# Patient Record
Sex: Female | Born: 1935 | Race: White | Hispanic: No | Marital: Married | Smoking: Former smoker
Health system: Southern US, Community
[De-identification: ages and names within clinical notes are randomized; demographics above are authoritative.]

## PROBLEM LIST (undated history)

## (undated) DIAGNOSIS — I890 Lymphedema, not elsewhere classified: Secondary | ICD-10-CM

## (undated) DIAGNOSIS — E7800 Pure hypercholesterolemia, unspecified: Secondary | ICD-10-CM

## (undated) DIAGNOSIS — E039 Hypothyroidism, unspecified: Secondary | ICD-10-CM

## (undated) DIAGNOSIS — K649 Unspecified hemorrhoids: Secondary | ICD-10-CM

## (undated) DIAGNOSIS — N390 Urinary tract infection, site not specified: Secondary | ICD-10-CM

## (undated) DIAGNOSIS — N952 Postmenopausal atrophic vaginitis: Secondary | ICD-10-CM

## (undated) DIAGNOSIS — F32A Depression, unspecified: Secondary | ICD-10-CM

## (undated) DIAGNOSIS — I499 Cardiac arrhythmia, unspecified: Secondary | ICD-10-CM

## (undated) DIAGNOSIS — F329 Major depressive disorder, single episode, unspecified: Secondary | ICD-10-CM

## (undated) DIAGNOSIS — F0390 Unspecified dementia without behavioral disturbance: Secondary | ICD-10-CM

## (undated) DIAGNOSIS — N816 Rectocele: Secondary | ICD-10-CM

## (undated) DIAGNOSIS — N815 Vaginal enterocele: Secondary | ICD-10-CM

## (undated) DIAGNOSIS — I10 Essential (primary) hypertension: Secondary | ICD-10-CM

## (undated) DIAGNOSIS — IMO0002 Reserved for concepts with insufficient information to code with codable children: Secondary | ICD-10-CM

## (undated) DIAGNOSIS — R339 Retention of urine, unspecified: Secondary | ICD-10-CM

## (undated) DIAGNOSIS — K5900 Constipation, unspecified: Secondary | ICD-10-CM

## (undated) DIAGNOSIS — F419 Anxiety disorder, unspecified: Secondary | ICD-10-CM

## (undated) DIAGNOSIS — E079 Disorder of thyroid, unspecified: Secondary | ICD-10-CM

## (undated) DIAGNOSIS — I509 Heart failure, unspecified: Secondary | ICD-10-CM

## (undated) HISTORY — DX: Heart failure, unspecified: I50.9

## (undated) HISTORY — DX: Anxiety disorder, unspecified: F41.9

## (undated) HISTORY — DX: Postmenopausal atrophic vaginitis: N95.2

## (undated) HISTORY — DX: Essential (primary) hypertension: I10

## (undated) HISTORY — DX: Retention of urine, unspecified: R33.9

## (undated) HISTORY — DX: Disorder of thyroid, unspecified: E07.9

## (undated) HISTORY — DX: Vaginal enterocele: N81.5

## (undated) HISTORY — PX: APPENDECTOMY: SHX54

## (undated) HISTORY — DX: Lymphedema, not elsewhere classified: I89.0

## (undated) HISTORY — DX: Unspecified hemorrhoids: K64.9

## (undated) HISTORY — DX: Depression, unspecified: F32.A

## (undated) HISTORY — DX: Constipation, unspecified: K59.00

## (undated) HISTORY — DX: Pure hypercholesterolemia, unspecified: E78.00

## (undated) HISTORY — DX: Urinary tract infection, site not specified: N39.0

## (undated) HISTORY — DX: Reserved for concepts with insufficient information to code with codable children: IMO0002

## (undated) HISTORY — DX: Cardiac arrhythmia, unspecified: I49.9

## (undated) HISTORY — DX: Major depressive disorder, single episode, unspecified: F32.9

## (undated) HISTORY — DX: Rectocele: N81.6

---

## 1975-01-14 HISTORY — PX: TOTAL ABDOMINAL HYSTERECTOMY: SHX209

## 2004-03-20 ENCOUNTER — Ambulatory Visit: Payer: Self-pay | Admitting: Internal Medicine

## 2004-06-19 ENCOUNTER — Ambulatory Visit: Payer: Self-pay | Admitting: Unknown Physician Specialty

## 2005-06-03 ENCOUNTER — Ambulatory Visit: Payer: Self-pay | Admitting: Internal Medicine

## 2006-03-03 ENCOUNTER — Other Ambulatory Visit: Payer: Self-pay

## 2006-03-03 ENCOUNTER — Ambulatory Visit: Payer: Self-pay | Admitting: Urology

## 2006-03-17 ENCOUNTER — Ambulatory Visit: Payer: Self-pay | Admitting: Urology

## 2010-01-13 HISTORY — PX: EYE SURGERY: SHX253

## 2010-12-19 ENCOUNTER — Ambulatory Visit: Payer: Self-pay | Admitting: Internal Medicine

## 2014-07-27 ENCOUNTER — Ambulatory Visit (INDEPENDENT_AMBULATORY_CARE_PROVIDER_SITE_OTHER): Payer: Medicare Other | Admitting: Obstetrics and Gynecology

## 2014-07-27 ENCOUNTER — Encounter: Payer: Self-pay | Admitting: Obstetrics and Gynecology

## 2014-07-27 VITALS — BP 121/73 | HR 56 | Ht 65.0 in | Wt 140.0 lb

## 2014-07-27 DIAGNOSIS — N815 Vaginal enterocele: Secondary | ICD-10-CM | POA: Diagnosis not present

## 2014-07-27 DIAGNOSIS — K5900 Constipation, unspecified: Secondary | ICD-10-CM | POA: Diagnosis not present

## 2014-07-27 DIAGNOSIS — IMO0002 Reserved for concepts with insufficient information to code with codable children: Secondary | ICD-10-CM

## 2014-07-27 DIAGNOSIS — E7800 Pure hypercholesterolemia, unspecified: Secondary | ICD-10-CM | POA: Insufficient documentation

## 2014-07-27 DIAGNOSIS — N816 Rectocele: Secondary | ICD-10-CM | POA: Insufficient documentation

## 2014-07-27 DIAGNOSIS — K649 Unspecified hemorrhoids: Secondary | ICD-10-CM | POA: Diagnosis not present

## 2014-07-27 DIAGNOSIS — Z4689 Encounter for fitting and adjustment of other specified devices: Secondary | ICD-10-CM | POA: Diagnosis not present

## 2014-07-27 DIAGNOSIS — N8110 Cystocele, unspecified: Secondary | ICD-10-CM | POA: Diagnosis not present

## 2014-07-27 DIAGNOSIS — Z780 Asymptomatic menopausal state: Secondary | ICD-10-CM | POA: Diagnosis not present

## 2014-07-27 DIAGNOSIS — F418 Other specified anxiety disorders: Secondary | ICD-10-CM

## 2014-07-27 DIAGNOSIS — F419 Anxiety disorder, unspecified: Secondary | ICD-10-CM

## 2014-07-27 DIAGNOSIS — N952 Postmenopausal atrophic vaginitis: Secondary | ICD-10-CM | POA: Diagnosis not present

## 2014-07-27 DIAGNOSIS — F32A Depression, unspecified: Secondary | ICD-10-CM | POA: Insufficient documentation

## 2014-07-27 DIAGNOSIS — R339 Retention of urine, unspecified: Secondary | ICD-10-CM | POA: Diagnosis not present

## 2014-07-27 DIAGNOSIS — F329 Major depressive disorder, single episode, unspecified: Secondary | ICD-10-CM | POA: Insufficient documentation

## 2014-07-27 DIAGNOSIS — I10 Essential (primary) hypertension: Secondary | ICD-10-CM | POA: Diagnosis not present

## 2014-07-27 DIAGNOSIS — N8111 Cystocele, midline: Secondary | ICD-10-CM | POA: Insufficient documentation

## 2014-07-27 NOTE — Progress Notes (Signed)
Patient ID: Deborah Martinez, female   DOB: Dec 14, 1927, 81 y.o.   MRN: [id number] Chief complaint: 1.  Pessary maintenance. 2.  Cystocele.  4 month pessary check Ring with support No-vaginal d/c,odor, or pain Premarin - q 7-10 days  OBJECTIVE: Pleasant, well-appearing female in no acute distress. Abdomen:  soft, nontender. Pelvic:  External genitalia: Normal  BUS: Normal .  Vagina: Healthy mucosa   PROCEDURE: Pessary is removed, cleaned, and replaced.  IMPRESSION: 1.  Pessary maintenance-normal encounter. 2.  Pessary is removed, cleaned and replaced.  PLAN: 1.  Continue with Premarin cream weekly. 2.  Return in 4 months for pessary maintenance

## 2014-10-31 ENCOUNTER — Encounter: Payer: Self-pay | Admitting: Obstetrics and Gynecology

## 2014-10-31 ENCOUNTER — Ambulatory Visit (INDEPENDENT_AMBULATORY_CARE_PROVIDER_SITE_OTHER): Payer: Medicare Other | Admitting: Obstetrics and Gynecology

## 2014-10-31 VITALS — BP 135/85 | HR 58 | Ht 65.0 in | Wt 139.4 lb

## 2014-10-31 DIAGNOSIS — N815 Vaginal enterocele: Secondary | ICD-10-CM

## 2014-10-31 DIAGNOSIS — N816 Rectocele: Secondary | ICD-10-CM

## 2014-10-31 DIAGNOSIS — N8110 Cystocele, unspecified: Secondary | ICD-10-CM | POA: Diagnosis not present

## 2014-10-31 DIAGNOSIS — N952 Postmenopausal atrophic vaginitis: Secondary | ICD-10-CM

## 2014-10-31 DIAGNOSIS — IMO0002 Reserved for concepts with insufficient information to code with codable children: Secondary | ICD-10-CM

## 2014-10-31 NOTE — Progress Notes (Signed)
Patient ID: Deborah Martinez, female   DOB: [birth year], 81 y.o.   MRN: [id number]  Pt presents for 3 month pessary check.   Pessary: ring with support Medication: continues with premarin vaginal cream every 7-10 days.    Expand All Collapse All   Patient ID: Deborah CatalanBetty G Martinez, female DOB: [birth year], 81 y.o. MRN: [id number] Chief complaint: 1. Pessary maintenance. 2. Cystocele.  4 month pessary check Ring with support Last visit 07/27/2014.  No problems with voiding, bowel movements.  No vaginal discharge or odor.  No-vaginal d/c,odor, or pain Premarin - q 7-10 days  OBJECTIVE: Pleasant, well-appearing female in no acute distress. Abdomen: soft, nontender. Pelvic: External genitalia: Normal BUS: Normal. Vagina: Healthy mucosa  PROCEDURE: Pessary is removed, cleaned, and replaced.  IMPRESSION: 1. Pessary maintenance-normal encounter. 2. Pessary is removed, cleaned and replaced.  PLAN: 1. Continue with Premarin cream weekly. 2. Return in 4 months for pessary maintenance  Herold HarmsMartin A Deroy Noah, MD  A total of 15 minutes were spent face-to-face with the patient during this encounter and over half of that time dealt with counseling and coordination of care.

## 2014-10-31 NOTE — Patient Instructions (Signed)
1.  Continue with Premarin cream, intravaginal every 7-10 days. 2.  Return in 4 months for pessary check.  [facility](Mid February).

## 2014-11-28 ENCOUNTER — Ambulatory Visit: Payer: Medicare Other | Admitting: Obstetrics and Gynecology

## 2015-03-06 ENCOUNTER — Ambulatory Visit (INDEPENDENT_AMBULATORY_CARE_PROVIDER_SITE_OTHER): Payer: Medicare Other | Admitting: Obstetrics and Gynecology

## 2015-03-06 ENCOUNTER — Encounter: Payer: Self-pay | Admitting: Obstetrics and Gynecology

## 2015-03-06 VITALS — BP 113/69 | HR 54 | Ht 65.0 in | Wt 137.9 lb

## 2015-03-06 DIAGNOSIS — R339 Retention of urine, unspecified: Secondary | ICD-10-CM | POA: Diagnosis not present

## 2015-03-06 DIAGNOSIS — N8110 Cystocele, unspecified: Secondary | ICD-10-CM | POA: Diagnosis not present

## 2015-03-06 DIAGNOSIS — Z4689 Encounter for fitting and adjustment of other specified devices: Secondary | ICD-10-CM | POA: Diagnosis not present

## 2015-03-06 DIAGNOSIS — IMO0002 Reserved for concepts with insufficient information to code with codable children: Secondary | ICD-10-CM

## 2015-03-06 DIAGNOSIS — N815 Vaginal enterocele: Secondary | ICD-10-CM | POA: Diagnosis not present

## 2015-03-06 MED ORDER — ESTROGENS, CONJUGATED 0.625 MG/GM VA CREA: 0.2500 | TOPICAL_CREAM | VAGINAL | Status: DC

## 2015-03-06 NOTE — Patient Instructions (Signed)
1. Return in 4 months for pessary check 2. Continue with Premarin cream intravaginal once a week

## 2015-03-06 NOTE — Progress Notes (Signed)
Expand All Collapse All   Patient ID: Deborah Martinez, female DOB: [birth year], 82 y.o. MRN: [id number]  Pt presents for 3 month pessary check.  Pessary: ring with support Medication: continues with premarin vaginal cream every 7-10 days.    Expand All Collapse All  Patient ID: Deborah Martinez, female DOB: 1927-06-10, 82 y.o. MRN: [id number] Chief complaint: 1. Pessary maintenance. 2. Cystocele.  4 month pessary check Ring with support Last visit 10/31/2014.  No problems with voiding, bowel movements.  No vaginal discharge or odor.  No-vaginal d/c,odor, or pain Premarin - q 7-10 days  OBJECTIVE: Pleasant, well-appearing female in no acute distress. Abdomen: soft, nontender. Pelvic: External genitalia: Normal BUS: Normal. Vagina: Healthy mucosa; no discharge  PROCEDURE: Pessary is removed, cleaned, and replaced.  IMPRESSION: 1. Pessary maintenance-normal encounter. 2. Pessary is removed, cleaned and replaced.  PLAN: 1. Continue with Premarin cream weekly. 2. Return in 4 months for pessary maintenance  Herold Harms, MD  A total of 15 minutes were spent face-to-face with the patient during this encounter and over half of that time dealt with counseling and coordination of care.

## 2015-07-04 ENCOUNTER — Ambulatory Visit: Payer: Medicare Other | Admitting: Obstetrics and Gynecology

## 2015-07-12 ENCOUNTER — Encounter: Payer: Self-pay | Admitting: Obstetrics and Gynecology

## 2015-07-12 ENCOUNTER — Ambulatory Visit (INDEPENDENT_AMBULATORY_CARE_PROVIDER_SITE_OTHER): Payer: Medicare Other | Admitting: Obstetrics and Gynecology

## 2015-07-12 VITALS — BP 120/79 | HR 92 | Wt 141.6 lb

## 2015-07-12 DIAGNOSIS — IMO0002 Reserved for concepts with insufficient information to code with codable children: Secondary | ICD-10-CM

## 2015-07-12 DIAGNOSIS — N8110 Cystocele, unspecified: Secondary | ICD-10-CM

## 2015-07-12 DIAGNOSIS — Z4689 Encounter for fitting and adjustment of other specified devices: Secondary | ICD-10-CM | POA: Diagnosis not present

## 2015-07-12 DIAGNOSIS — N952 Postmenopausal atrophic vaginitis: Secondary | ICD-10-CM

## 2015-07-12 NOTE — Progress Notes (Signed)
Chief complaint: 1. Pessary maintenance 2. Cystocele  4 month pessary check Ring with support Last visit 03/06/2015  No problems with voiding, bowel movements. No vaginal discharge. Using Premarin cream intravaginal every week  OBJECTIVE: BP 120/79 mmHg  Pulse 92  Wt 141 lb 9 oz (64.212 kg) Pleasant well-appearing white female in no acute distress. Alert and oriented. Abdomen: Soft, nontender Pelvic exam: External genitalia within normal BUS-normal Vagina-mucosa healthy; no significant discharge  PROCEDURE: Pessary is removed, cleaned, and reinserted  ASSESSMENT: 1. Pessary maintenance, routine 2. Pessary is removed, cleaned, and reinserted  PLAN: 1. Continue with Premarin cream weekly intravaginal 2. Return in 4 months for pessary maintenance  A total of 15 minutes were spent face-to-face with the patient during this encounter and over half of that time dealt with counseling and coordination of care.  Herold HarmsMartin A Haillee Johann, MD  Note: This dictation was prepared with Dragon dictation along with smaller phrase technology. Any transcriptional errors that result from this process are unintentional.

## 2015-07-12 NOTE — Patient Instructions (Signed)
1. Return in 4 months for pessary maintenance

## 2015-11-13 ENCOUNTER — Ambulatory Visit (INDEPENDENT_AMBULATORY_CARE_PROVIDER_SITE_OTHER): Payer: Medicare Other | Admitting: Obstetrics and Gynecology

## 2015-11-13 ENCOUNTER — Encounter: Payer: Self-pay | Admitting: Obstetrics and Gynecology

## 2015-11-13 VITALS — BP 121/77 | HR 60 | Ht 65.0 in | Wt 138.1 lb

## 2015-11-13 DIAGNOSIS — R339 Retention of urine, unspecified: Secondary | ICD-10-CM | POA: Diagnosis not present

## 2015-11-13 DIAGNOSIS — N815 Vaginal enterocele: Secondary | ICD-10-CM | POA: Diagnosis not present

## 2015-11-13 DIAGNOSIS — K5900 Constipation, unspecified: Secondary | ICD-10-CM | POA: Diagnosis not present

## 2015-11-13 DIAGNOSIS — N952 Postmenopausal atrophic vaginitis: Secondary | ICD-10-CM | POA: Diagnosis not present

## 2015-11-13 DIAGNOSIS — N816 Rectocele: Secondary | ICD-10-CM | POA: Diagnosis not present

## 2015-11-13 DIAGNOSIS — Z4689 Encounter for fitting and adjustment of other specified devices: Secondary | ICD-10-CM

## 2015-11-13 NOTE — Progress Notes (Signed)
Chief complaint: 1. Pessary maintenance 2. Cystocele  4 month pessary check Ring with support Last visit 07/12/2015  No problems with voiding, bowel movements.mild constipation No vaginal discharge. Using Premarin cream intravaginal every week  OBJECTIVE: BP 121/77   Pulse 60   Ht 5\' 5"  (1.651 m)   Wt 138 lb 1.6 oz (62.6 kg)   BMI 22.98 kg/m  Pleasant well-appearing white female in no acute distress. Alert and oriented. Abdomen: Soft, nontender Pelvic exam: External genitalia within normal BUS-normal Vagina-mucosa healthy; no significant discharge; 10 mm area of erythema along the right vaginal apex without ulceration  PROCEDURE: Pessary is removed, cleaned, and reinserted  ASSESSMENT: 1. Pessary maintenance, routine 2. Pessary is removed, cleaned, and reinserted 3. Chronic constipation  PLAN: 1. Continue with Premarin cream weekly intravaginal 2. Return in 4 months for pessary maintenance 3. Recommend Colace 100 mg twice a d.Recommend Metamucil once daily  A total of 15 minutes were spent face-to-face with the patient during this encounter and over half of that time dealt with counseling and coordination of care.  Herold HarmsMartin A Sophie Quiles, MD  Note: This dictation was prepared with Dragon dictation along with smaller phrase technology. Any transcriptional errors that result from this process are unintentional.

## 2015-11-13 NOTE — Patient Instructions (Signed)
1. Return in 4 months for pessary maintenance 2. Consider Colace 100 mg twice a day for constipation 3. Consider Metamucil once a day for constipation

## 2016-03-12 ENCOUNTER — Ambulatory Visit (INDEPENDENT_AMBULATORY_CARE_PROVIDER_SITE_OTHER): Payer: Medicare Other | Admitting: Obstetrics and Gynecology

## 2016-03-12 ENCOUNTER — Encounter: Payer: Self-pay | Admitting: Obstetrics and Gynecology

## 2016-03-12 VITALS — BP 99/65 | HR 105 | Ht 65.0 in | Wt 137.8 lb

## 2016-03-12 DIAGNOSIS — N8111 Cystocele, midline: Secondary | ICD-10-CM | POA: Diagnosis not present

## 2016-03-12 DIAGNOSIS — Z4689 Encounter for fitting and adjustment of other specified devices: Secondary | ICD-10-CM

## 2016-03-12 DIAGNOSIS — N816 Rectocele: Secondary | ICD-10-CM

## 2016-03-12 NOTE — Patient Instructions (Signed)
1. Continue using Premarin cream weekly intravaginal 2. Return in 4 months for pessary maintenance 3. Recommend continued Colace 100 mg twice a day, increase water intake, and Metamucil daily

## 2016-03-12 NOTE — Progress Notes (Signed)
Chief complaint: 1. Pessary maintenance (last visit 11/13/2015) 2. Cystocele 3. Rectocele  4 month pessary check Ring with support Premarin cream intravaginal weekly  No vaginal discharge, vaginal bleeding, vaginal odor. Bowel function is unchanged; some chronic constipation occurs intermittently No UTI symptoms  Past medical history, past surgical history, problem list, medications, and allergies are reviewed  OBJECTIVE: BP 99/65   Pulse (!) 105   Ht 5\' 5"  (1.651 m)   Wt 137 lb 12.8 oz (62.5 kg)   BMI 22.93 kg/m   Pleasant female in no acute distress. Alert and oriented. Abdomen: Soft, nontender Pelvic exam: External genitalia-normal BUS-normal Vagina-. Estrogen effect; no significant discharge; no areas of hyperemia or ulceration; significant stool noted in the rectal vault palpated through the posterior vaginal wall  ASSESSMENT: 1. Pessary maintenance 2. Pessary is removed, cleaned, and reinserted 3. Constipation 4. Cystocele, rectocele, stable  PLAN: 1. Continue with Premarin cream weekly intravaginal 2. Return in 4 months for pessary maintenance 3. Recommend continued Colace 100 mg twice a day, increase water intake, and Metamucil.  A total of 15 minutes were spent face-to-face with the patient during this encounter and over half of that time dealt with counseling and coordination of care.  Herold HarmsMartin A Axten Pascucci, MD  Note: This dictation was prepared with Dragon dictation along with smaller phrase technology. Any transcriptional errors that result from this process are unintentional.

## 2016-07-09 ENCOUNTER — Encounter: Payer: Medicare Other | Admitting: Obstetrics and Gynecology

## 2016-07-23 ENCOUNTER — Encounter: Payer: Medicare Other | Admitting: Obstetrics and Gynecology

## 2016-07-31 ENCOUNTER — Ambulatory Visit (INDEPENDENT_AMBULATORY_CARE_PROVIDER_SITE_OTHER): Payer: Medicare Other | Admitting: Obstetrics and Gynecology

## 2016-07-31 ENCOUNTER — Encounter: Payer: Self-pay | Admitting: Obstetrics and Gynecology

## 2016-07-31 VITALS — BP 93/55 | HR 60 | Ht 65.0 in | Wt 138.4 lb

## 2016-07-31 DIAGNOSIS — N816 Rectocele: Secondary | ICD-10-CM

## 2016-07-31 DIAGNOSIS — Z4689 Encounter for fitting and adjustment of other specified devices: Secondary | ICD-10-CM

## 2016-07-31 DIAGNOSIS — N8111 Cystocele, midline: Secondary | ICD-10-CM | POA: Diagnosis not present

## 2016-07-31 DIAGNOSIS — N815 Vaginal enterocele: Secondary | ICD-10-CM

## 2016-07-31 DIAGNOSIS — R339 Retention of urine, unspecified: Secondary | ICD-10-CM

## 2016-07-31 NOTE — Patient Instructions (Signed)
1. Return in 4 months for pessary maintenance 2. Continue using Premarin cream intravaginal once or twice a week

## 2016-07-31 NOTE — Progress Notes (Signed)
Chief complaint: 1. Pessary maintenance (last visit 03/12/2016 2. Cystocele 3. Rectocele  4 month pessary check Ring with support Premarin cream intravaginal weekly  No vaginal discharge, vaginal bleeding, vaginal odor. Bowel function is unchanged; some chronic constipation occurs intermittently No UTI symptoms [place] [facility] [place]  Past medical history, past surgical history, problem list, medications, and allergies are reviewed  OBJECTIVE: BP (!) 93/55   Pulse 60   Ht 5\' 5"  (1.651 m)   Wt 138 lb 6.4 oz (62.8 kg)   BMI 23.03 kg/m  Pleasant female in no acute distress. Alert and oriented. Abdomen: Soft, nontender Pelvic exam: External genitalia-normal BUS-normal Vagina-. Estrogen effect; no significant discharge; no areas of hyperemia or ulceration  ASSESSMENT: 1. Pessary maintenance 2. Pessary is removed, cleaned, and reinserted 3. Cystocele, rectocele, stable  PLAN: 1. Continue with Premarin cream weekly intravaginal 2. Return in 4 months for pessary maintenance  A total of 15 minutes were spent face-to-face with the patient during this encounter and over half of that time dealt with counseling and coordination of care.  Herold HarmsMartin A Ricketta Colantonio, MD  Note: This dictation was prepared with Dragon dictation along with smaller phrase technology. Any transcriptional errors that result from this process are unintentional.

## 2016-09-24 ENCOUNTER — Ambulatory Visit: Payer: Medicare Other | Admitting: Family Medicine

## 2016-12-02 ENCOUNTER — Encounter: Payer: Medicare Other | Admitting: Obstetrics and Gynecology

## 2016-12-16 ENCOUNTER — Emergency Department
Admission: EM | Admit: 2016-12-16 | Discharge: 2016-12-16 | Disposition: A | Discharge status: Home / Self Care | Payer: Medicare Other | Attending: Emergency Medicine | Admitting: Emergency Medicine

## 2016-12-16 ENCOUNTER — Encounter: Payer: Self-pay | Admitting: Emergency Medicine

## 2016-12-16 DIAGNOSIS — Z87891 Personal history of nicotine dependence: Secondary | ICD-10-CM | POA: Diagnosis not present

## 2016-12-16 DIAGNOSIS — E860 Dehydration: Secondary | ICD-10-CM | POA: Diagnosis not present

## 2016-12-16 DIAGNOSIS — I10 Essential (primary) hypertension: Secondary | ICD-10-CM | POA: Insufficient documentation

## 2016-12-16 DIAGNOSIS — N3000 Acute cystitis without hematuria: Secondary | ICD-10-CM

## 2016-12-16 DIAGNOSIS — R42 Dizziness and giddiness: Secondary | ICD-10-CM | POA: Diagnosis present

## 2016-12-16 DIAGNOSIS — N390 Urinary tract infection, site not specified: Secondary | ICD-10-CM | POA: Insufficient documentation

## 2016-12-16 LAB — CBC WITH DIFFERENTIAL/PLATELET
BASOS ABS: 0 10*3/uL (ref 0–0.1)
BASOS PCT: 1 %
EOS ABS: 0.1 10*3/uL (ref 0–0.7)
EOS PCT: 1 %
HCT: 39.3 % (ref 35.0–47.0)
Hemoglobin: 13.6 g/dL (ref 12.0–16.0)
LYMPHS PCT: 23 %
Lymphs Abs: 1.7 10*3/uL (ref 1.0–3.6)
MCH: 32.2 pg (ref 26.0–34.0)
MCHC: 34.7 g/dL (ref 32.0–36.0)
MCV: 92.9 fL (ref 80.0–100.0)
MONO ABS: 1 10*3/uL — AB (ref 0.2–0.9)
Monocytes Relative: 14 %
Neutro Abs: 4.6 10*3/uL (ref 1.4–6.5)
Neutrophils Relative %: 61 %
PLATELETS: 225 10*3/uL (ref 150–440)
RBC: 4.23 MIL/uL (ref 3.80–5.20)
RDW: 12.3 % (ref 11.5–14.5)
WBC: 7.5 10*3/uL (ref 3.6–11.0)

## 2016-12-16 LAB — COMPREHENSIVE METABOLIC PANEL
ALT: 51 U/L (ref 14–54)
AST: 35 U/L (ref 15–41)
Albumin: 3.6 g/dL (ref 3.5–5.0)
Alkaline Phosphatase: 57 U/L (ref 38–126)
Anion gap: 10 (ref 5–15)
BUN: 27 mg/dL — AB (ref 6–20)
CHLORIDE: 104 mmol/L (ref 101–111)
CO2: 25 mmol/L (ref 22–32)
Calcium: 9.3 mg/dL (ref 8.9–10.3)
Creatinine, Ser: 0.81 mg/dL (ref 0.44–1.00)
GFR calc Af Amer: >60 mL/min (ref >60)
Glucose, Bld: 118 mg/dL — ABNORMAL HIGH (ref 65–99)
POTASSIUM: 3.4 mmol/L — AB (ref 3.5–5.1)
SODIUM: 139 mmol/L (ref 135–145)
Total Bilirubin: 0.7 mg/dL (ref 0.3–1.2)
Total Protein: 6.1 g/dL — ABNORMAL LOW (ref 6.5–8.1)

## 2016-12-16 LAB — URINALYSIS, COMPLETE (UACMP) WITH MICROSCOPIC
Bacteria, UA: NONE SEEN
Bilirubin Urine: NEGATIVE
GLUCOSE, UA: NEGATIVE mg/dL
HGB URINE DIPSTICK: NEGATIVE
KETONES UR: NEGATIVE mg/dL
Nitrite: NEGATIVE
PROTEIN: NEGATIVE mg/dL
Specific Gravity, Urine: 1.011 (ref 1.005–1.030)
pH: 6 (ref 5.0–8.0)

## 2016-12-16 LAB — TROPONIN I

## 2016-12-16 LAB — GLUCOSE, CAPILLARY: Glucose-Capillary: 117 mg/dL — ABNORMAL HIGH (ref 65–99)

## 2016-12-16 MED ORDER — CEPHALEXIN 500 MG PO CAPS: 500.0000 mg | ORAL_CAPSULE | Freq: Four times a day (QID) | ORAL | 0 refills | Status: AC

## 2016-12-16 MED ORDER — CEFTRIAXONE SODIUM IN DEXTROSE 20 MG/ML IV SOLN
1.0000 g | Freq: Once | INTRAVENOUS | Status: AC
  Administered 2016-12-16: 1 g via INTRAVENOUS
  Filled 2016-12-16: qty 50

## 2016-12-16 MED ORDER — SODIUM CHLORIDE 0.9 % IV SOLN
1000.0000 mL | Freq: Once | INTRAVENOUS | Status: AC
  Administered 2016-12-16: 1000 mL via INTRAVENOUS

## 2016-12-16 NOTE — ED Provider Notes (Signed)
[facility] Emergency Department Provider Note       Time seen: ----------------------------------------- 9:45 AM on 12/16/2016 -----------------------------------------   I have reviewed the triage vital signs and the nursing notes.  HISTORY   Chief Complaint Dizziness    HPI Deborah Martinez is a 83 y.o. female with a history of anxiety, hypertension and UTIs who presents to the ED for complaints of new onset dizziness at 9:00 today.  She denies any other complaints.  She denies fevers, chills, chest pain, shortness of breath, vomiting or diarrhea.  She states she has been eating drinking and drinking normally.  She denies any changes in her medicines.  She states she has not felt this way before.  Past Medical History:  Diagnosis Date  . Anxiety   . Constipation   . Cystocele   . Depression   . Hemorrhoid   . Hypercholesterolemia   . Hypertension   . Incomplete bladder emptying   . Postmenopausal atrophic vaginitis   . Rectocele   . Thyroid disease   . UTI (lower urinary tract infection)   . Vaginal enterocele     Patient Active Problem List   Diagnosis Date Noted  . Cystocele 07/27/2014  . Menopause 07/27/2014  . Vaginal atrophy 07/27/2014  . Incomplete bladder emptying 07/27/2014  . Anxiety and depression 07/27/2014  . Constipation 07/27/2014  . Hypertension 07/27/2014  . Hypercholesterolemia 07/27/2014  . Hemorrhoids 07/27/2014  . Rectocele 07/27/2014  . Vaginal enterocele 07/27/2014    Past Surgical History:  Procedure Laterality Date  . APPENDECTOMY    . EYE SURGERY  2012   remve cataract  . TOTAL ABDOMINAL HYSTERECTOMY  1977    Allergies Patient has no known allergies.  Social History Social History   Tobacco Use  . Smoking status: Former Games developermoker  . Smokeless tobacco: Never Used  Substance Use Topics  . Alcohol use: No  . Drug use: No    Review of Systems Constitutional: Negative for fever. Eyes: Negative for  vision changes ENT:  Negative for congestion, sore throat Cardiovascular: Negative for chest pain. Respiratory: Negative for shortness of breath. Gastrointestinal: Negative for abdominal pain, vomiting and diarrhea. Genitourinary: Negative for dysuria. Musculoskeletal: Negative for back pain. Skin: Negative for rash. Neurological: Negative for headaches, focal weakness or numbness.  Positive for dizziness  All systems negative/normal/unremarkable except as stated in the HPI  ____________________________________________   PHYSICAL EXAM:  VITAL SIGNS: ED Triage Vitals [12/16/16 0945]  Enc Vitals Group     BP      Pulse Rate 77     Resp 19     Temp 98.1 F (36.7 C)     Temp Source Oral     SpO2 98 %     Weight      Height      Head Circumference      Peak Flow      Pain Score      Pain Loc      Pain Edu?      Excl. in GC?     Constitutional: Alert and oriented. Well appearing and in no distress. Eyes: Conjunctivae are normal. Normal extraocular movements. ENT   Head: Normocephalic and atraumatic.   Nose: No congestion/rhinnorhea.   Mouth/Throat: Mucous membranes are moist.   Neck: No stridor. Cardiovascular: Normal rate, regular rhythm. No murmurs, rubs, or gallops. Respiratory: Normal respiratory effort without tachypnea nor retractions. Breath sounds are clear and equal bilaterally. No wheezes/rales/rhonchi. Gastrointestinal: Soft and nontender. Normal bowel  sounds Musculoskeletal: Nontender with normal range of motion in extremities. No lower extremity tenderness nor edema. Neurologic:  Normal speech and language. No gross focal neurologic deficits are appreciated.  Skin:  Skin is warm, dry and intact. No rash noted. Psychiatric: Mood and affect are normal. Speech and behavior are normal.  ____________________________________________  EKG: Interpreted by me.  Sinus rhythm rate 89 bpm, normal PR interval, possible septal infarct age-indeterminate, long  QT.  ____________________________________________  ED COURSE:  Pertinent labs & imaging results that were available during my care of the patient were reviewed by me and considered in my medical decision making (see chart for details). Patient presents for dizziness, we will assess with labs and imaging as indicated.   Procedures ____________________________________________   LABS (pertinent positives/negatives)  Labs Reviewed  CBC WITH DIFFERENTIAL/PLATELET - Abnormal; Notable for the following components:      Result Value   Monocytes Absolute 1.0 (*)    All other components within normal limits  COMPREHENSIVE METABOLIC PANEL - Abnormal; Notable for the following components:   Potassium 3.4 (*)    Glucose, Bld 118 (*)    BUN 27 (*)    Total Protein 6.1 (*)    All other components within normal limits  URINALYSIS, COMPLETE (UACMP) WITH MICROSCOPIC - Abnormal; Notable for the following components:   Color, Urine YELLOW (*)    APPearance HAZY (*)    Leukocytes, UA LARGE (*)    Squamous Epithelial / LPF 0-5 (*)    All other components within normal limits  GLUCOSE, CAPILLARY - Abnormal; Notable for the following components:   Glucose-Capillary 117 (*)    All other components within normal limits  URINE CULTURE  TROPONIN I  CBG MONITORING, ED   ____________________________________________  DIFFERENTIAL DIAGNOSIS   Occult infection, dehydration, electrolyte abnormality, medication side effect, arrhythmia, vertigo, CVA  FINAL ASSESSMENT AND PLAN  dizziness, dehydration, UTI   Plan: Patient had presented for complaints of dizziness. Patient's labs did reveal some dehydration with an elevated BUN.  She was given a liter of fluids and after which she was able to ambulate without difficulty.  She also had signs of UTI.  We have sent a urine culture and given IV Rocephin.  She will be discharged with Keflex and is stable for follow-up with her doctor in 2 days.  Emily FilbertWilliams,  Jonathan E, MD   Note: This note was generated in part or whole with voice recognition software. Voice recognition is usually quite accurate but there are transcription errors that can and very often do occur. I apologize for any typographical errors that were not detected and corrected.     Emily FilbertWilliams, Jonathan E, MD 12/16/16 [phone]

## 2016-12-16 NOTE — ED Notes (Signed)
Pt ambulatory to toilet without difficulty, denies any dizziness at this time.

## 2016-12-16 NOTE — ED Notes (Signed)
Pt placed on monitor, EKG performed, exported and given to doctor. Yellow socks placed on pt and pt given warm blanket.

## 2016-12-16 NOTE — ED Triage Notes (Signed)
Pt in via [facility] from home with complaints of new onset dizziness at approximately 0900 today.  Pt denies any other complaints at this time.  Vitals WDL, NAD noted.

## 2016-12-16 NOTE — ED Notes (Signed)
Pt discharged home after verbalizing understanding of discharge instructions; nad noted. 

## 2016-12-17 LAB — URINE CULTURE: Special Requests: NORMAL

## 2016-12-29 DIAGNOSIS — E860 Dehydration: Secondary | ICD-10-CM | POA: Insufficient documentation

## 2016-12-29 DIAGNOSIS — I10 Essential (primary) hypertension: Secondary | ICD-10-CM | POA: Insufficient documentation

## 2016-12-29 DIAGNOSIS — Z79899 Other long term (current) drug therapy: Secondary | ICD-10-CM | POA: Diagnosis not present

## 2016-12-29 DIAGNOSIS — Z87891 Personal history of nicotine dependence: Secondary | ICD-10-CM | POA: Insufficient documentation

## 2016-12-29 DIAGNOSIS — N390 Urinary tract infection, site not specified: Secondary | ICD-10-CM | POA: Insufficient documentation

## 2016-12-29 DIAGNOSIS — R000 Tachycardia, unspecified: Secondary | ICD-10-CM | POA: Diagnosis present

## 2016-12-29 DIAGNOSIS — Z7982 Long term (current) use of aspirin: Secondary | ICD-10-CM | POA: Diagnosis not present

## 2016-12-30 ENCOUNTER — Emergency Department: Payer: Medicare Other

## 2016-12-30 ENCOUNTER — Encounter: Payer: Self-pay | Admitting: Emergency Medicine

## 2016-12-30 ENCOUNTER — Other Ambulatory Visit: Payer: Self-pay

## 2016-12-30 ENCOUNTER — Emergency Department
Admission: EM | Admit: 2016-12-30 | Discharge: 2016-12-30 | Disposition: A | Discharge status: Home / Self Care | Payer: Medicare Other | Attending: Emergency Medicine | Admitting: Emergency Medicine

## 2016-12-30 DIAGNOSIS — N390 Urinary tract infection, site not specified: Secondary | ICD-10-CM

## 2016-12-30 DIAGNOSIS — E860 Dehydration: Secondary | ICD-10-CM

## 2016-12-30 LAB — CBC
HEMATOCRIT: 38.9 % (ref 35.0–47.0)
Hemoglobin: 13.1 g/dL (ref 12.0–16.0)
MCH: 31.5 pg (ref 26.0–34.0)
MCHC: 33.8 g/dL (ref 32.0–36.0)
MCV: 93.4 fL (ref 80.0–100.0)
Platelets: 254 10*3/uL (ref 150–440)
RBC: 4.17 MIL/uL (ref 3.80–5.20)
RDW: 12.6 % (ref 11.5–14.5)
WBC: 6.2 10*3/uL (ref 3.6–11.0)

## 2016-12-30 LAB — TROPONIN I: Troponin I: <0.03 ng/mL (ref <0.03)

## 2016-12-30 LAB — URINALYSIS, COMPLETE (UACMP) WITH MICROSCOPIC
BILIRUBIN URINE: NEGATIVE
Glucose, UA: NEGATIVE mg/dL
HGB URINE DIPSTICK: NEGATIVE
KETONES UR: NEGATIVE mg/dL
NITRITE: NEGATIVE
PH: 7 (ref 5.0–8.0)
PROTEIN: NEGATIVE mg/dL
Specific Gravity, Urine: 1.005 (ref 1.005–1.030)

## 2016-12-30 LAB — BASIC METABOLIC PANEL
ANION GAP: 10 (ref 5–15)
BUN: 27 mg/dL — ABNORMAL HIGH (ref 6–20)
CHLORIDE: 101 mmol/L (ref 101–111)
CO2: 25 mmol/L (ref 22–32)
Calcium: 9.5 mg/dL (ref 8.9–10.3)
Creatinine, Ser: 0.84 mg/dL (ref 0.44–1.00)
GFR calc non Af Amer: >60 mL/min (ref >60)
Glucose, Bld: 125 mg/dL — ABNORMAL HIGH (ref 65–99)
POTASSIUM: 4 mmol/L (ref 3.5–5.1)
SODIUM: 136 mmol/L (ref 135–145)

## 2016-12-30 MED ORDER — SODIUM CHLORIDE 0.9 % IV BOLUS (SEPSIS)
1000.0000 mL | Freq: Once | INTRAVENOUS | Status: AC
  Administered 2016-12-30: 1000 mL via INTRAVENOUS

## 2016-12-30 MED ORDER — CIPROFLOXACIN HCL 500 MG PO TABS: 500.0000 mg | ORAL_TABLET | Freq: Two times a day (BID) | ORAL | 0 refills | Status: DC

## 2016-12-30 MED ORDER — CIPROFLOXACIN HCL 500 MG PO TABS
500.0000 mg | ORAL_TABLET | Freq: Once | ORAL | Status: AC
  Administered 2016-12-30: 500 mg via ORAL
  Filled 2016-12-30: qty 1

## 2016-12-30 NOTE — ED Triage Notes (Signed)
Pt presents to ED from home with c/o "rapid heart rate". Pt states she used to have "similar episodes but hasn't in a while". Pt denies chest pain or sob. Pt currently has no increased work of breathing noted. Skin warm and dry.

## 2016-12-30 NOTE — Discharge Instructions (Signed)
1.  Take antibiotic as prescribed (Cipro 500 mg twice daily x 7 days). 2.  Drink plenty of fluids daily. 3.  Return to the ER for worsening symptoms, persistent vomiting, difficulty breathing or other concerns.

## 2016-12-30 NOTE — ED Provider Notes (Signed)
[facility] Emergency Department Provider Note   ____________________________________________   First MD Initiated Contact with Patient 12/30/16 [phone]     (approximate)  I have reviewed the triage vital signs and the nursing notes.   HISTORY  Chief Complaint Tachycardia    HPI Deborah Martinez is a 83 y.o. female who presents to the ED from home with a chief complaint of "rapid heart rate".  Patient told triage nurse that she used to have similar episodes.  To me, she denies prior episodes of rapid heart rate.  No associated symptoms.  Specifically, patient denied chest pain, shortness of breath, vision changes, dizziness, abdominal pain, nausea or vomiting.  Denies recent travel or trauma.  Denies caffeine use, energy drinks or herbal medication.  Without intervention, symptoms have resolved.   Past Medical History:  Diagnosis Date  . Anxiety   . Constipation   . Cystocele   . Depression   . Hemorrhoid   . Hypercholesterolemia   . Hypertension   . Incomplete bladder emptying   . Postmenopausal atrophic vaginitis   . Rectocele   . Thyroid disease   . UTI (lower urinary tract infection)   . Vaginal enterocele     Patient Active Problem List   Diagnosis Date Noted  . Cystocele 07/27/2014  . Menopause 07/27/2014  . Vaginal atrophy 07/27/2014  . Incomplete bladder emptying 07/27/2014  . Anxiety and depression 07/27/2014  . Constipation 07/27/2014  . Hypertension 07/27/2014  . Hypercholesterolemia 07/27/2014  . Hemorrhoids 07/27/2014  . Rectocele 07/27/2014  . Vaginal enterocele 07/27/2014    Past Surgical History:  Procedure Laterality Date  . APPENDECTOMY    . EYE SURGERY  2012   remve cataract  . TOTAL ABDOMINAL HYSTERECTOMY  1977    Prior to Admission medications   Medication Sig Start Date End Date Taking? Authorizing Provider  alendronate (FOSAMAX) 70 MG tablet Take 70 mg by mouth once a week. Take with a full glass of water on an  empty stomach.    [provider]  ALPRAZolam Prudy Feeler(XANAX) 0.25 MG tablet Take 0.25 mg by mouth at bedtime as needed for anxiety.    [provider]  amLODipine (NORVASC) 5 MG tablet Take 5 mg by mouth daily.    [provider]  aspirin 81 MG tablet Take 81 mg by mouth daily.    [provider]  atorvastatin (LIPITOR) 10 MG tablet Take 10 mg by mouth. 11/25/11   [provider]  conjugated estrogens (PREMARIN) vaginal cream Place 0.25 Applicatorfuls vaginally once a week. 1/2 gram weekly 03/06/15   Defrancesco, Prentice DockerMartin A, MD  escitalopram (LEXAPRO) 10 MG tablet Take 10 mg by mouth.    [provider]  fluticasone (FLONASE) 50 MCG/ACT nasal spray fluticasone 50 mcg/actuation nasal spray,suspension    [provider]  levothyroxine (SYNTHROID, LEVOTHROID) 50 MCG tablet Take 50 mcg by mouth daily before breakfast.    [provider]  losartan (COZAAR) 100 MG tablet Take 100 mg by mouth daily.    [provider]  meloxicam (MOBIC) 15 MG tablet Take 15 mg by mouth daily.    [provider]  nadolol (CORGARD) 80 MG tablet Take 80 mg by mouth daily.    [provider]  potassium chloride (KLOR-CON) 8 MEQ tablet Take 8 mEq by mouth daily.    [provider]  venlafaxine XR (EFFEXOR-XR) 37.5 MG 24 hr capsule venlafaxine ER 37.5 mg capsule,extended release 24 hr  Start 1 tablet  po q day for 1 week then 2 tablets po q day x1 week and continue titrating to 150 mg po q day    [provider]  zolpidem (AMBIEN) 10 MG tablet Take 10 mg by mouth. 11/25/11   [provider]    Allergies Patient has no known allergies.  Family History  Problem Relation Age of Onset  . Heart attack Father   . Diabetes Brother   . Cancer Neg Hx     Social History Social History   Tobacco Use  . Smoking status: Former Games developermoker  . Smokeless tobacco: Never Used  Substance Use Topics  . Alcohol use: No  .  Drug use: No    Review of Systems  Constitutional: No fever/chills. Eyes: No visual changes. ENT: No sore throat. Cardiovascular: Positive for fast heart rate.  Denies chest pain. Respiratory: Denies shortness of breath. Gastrointestinal: No abdominal pain.  No nausea, no vomiting.  No diarrhea.  No constipation. Genitourinary: Negative for dysuria. Musculoskeletal: Negative for back pain. Skin: Negative for rash. Neurological: Negative for headaches, focal weakness or numbness.   ____________________________________________   PHYSICAL EXAM:  VITAL SIGNS: ED Triage Vitals  Enc Vitals Group     BP 12/30/16 0004 (!) 127/101     Pulse Rate 12/30/16 0004 (!) 101     Resp 12/30/16 0004 16     Temp 12/30/16 0004 97.7 F (36.5 C)     Temp Source 12/30/16 0004 Oral     SpO2 12/30/16 0004 99 %     Weight 12/30/16 0005 134 lb (60.8 kg)     Height 12/30/16 0005 5\' 6"  (1.676 m)     Head Circumference --      Peak Flow --      Pain Score --      Pain Loc --      Pain Edu? --      Excl. in GC? --     Constitutional: Alert and oriented. Well appearing and in no acute distress. Eyes: Conjunctivae are normal. PERRL. EOMI. Head: Atraumatic. Nose: No congestion/rhinnorhea. Mouth/Throat: Mucous membranes are moist.  Oropharynx non-erythematous. Neck: No stridor.   Cardiovascular: Normal rate, regular rhythm. Grossly normal heart sounds.  Good peripheral circulation. Respiratory: Normal respiratory effort.  No retractions. Lungs CTAB. Gastrointestinal: Soft and nontender. No distention. No abdominal bruits. No CVA tenderness. Musculoskeletal: No lower extremity tenderness nor edema.  No joint effusions. Neurologic:  Normal speech and language. No gross focal neurologic deficits are appreciated. No gait instability. Skin:  Skin is warm, dry and intact. No rash noted. Psychiatric: Mood and affect are normal. Speech and behavior are  normal.  ____________________________________________   LABS (all labs ordered are listed, but only abnormal results are displayed)  Labs Reviewed  BASIC METABOLIC PANEL - Abnormal; Notable for the following components:      Result Value   Glucose, Bld 125 (*)    BUN 27 (*)    All other components within normal limits  URINALYSIS, COMPLETE (UACMP) WITH MICROSCOPIC - Abnormal; Notable for the following components:   Color, Urine YELLOW (*)    APPearance HAZY (*)    Leukocytes, UA LARGE (*)    Bacteria, UA MANY (*)    Squamous Epithelial / LPF 6-30 (*)    All other components within normal limits  CBC  TROPONIN I   ____________________________________________  EKG  ED ECG REPORT I, SUNG,JADE J, the attending physician, personally viewed and interpreted this ECG.   Date: 12/30/2016  EKG Time: 0037  Rate: 101  Rhythm: sinus tachycardia  Axis: Normal  Intervals:none  ST&T Change: Nonspecific  ____________________________________________  RADIOLOGY  Dg Chest 2 View  Result Date: 12/30/2016 CLINICAL DATA:  Tachycardia. EXAM: CHEST  2 VIEW COMPARISON:  Radiograph 12/19/2010 FINDINGS: Heart is normal in size. Tortuosity and atherosclerosis of the thoracic aorta. Small to moderate retrocardiac hiatal hernia. Minimal right basilar scarring. Pulmonary vasculature is normal. No consolidation, pleural effusion, or pneumothorax. No acute osseous abnormalities are seen. The bones are under mineralized. IMPRESSION: 1. No acute abnormality. 2.  Aortic Atherosclerosis (ICD10-I70.0). 3. Small to moderate retrocardiac hiatal hernia. Electronically Signed   By: Rubye Oaks M.D.   On: 12/30/2016 00:27    ____________________________________________   PROCEDURES  Procedure(s) performed: None  Procedures  Critical Care performed: No  ____________________________________________   INITIAL IMPRESSION / ASSESSMENT AND PLAN / ED COURSE  As part of my medical decision making, I  reviewed the following data within the electronic MEDICAL RECORD NUMBER History obtained from family, Nursing notes reviewed and incorporated, Labs reviewed, EKG interpreted, Old chart reviewed, Radiograph reviewed and Notes from prior ED visits.   83 year old female who presents with palpitations. Differential diagnosis includes, but is not limited to, ACS, aortic dissection, pulmonary embolism, cardiac tamponade, pneumothorax, pneumonia, pericarditis, myocarditis, GI-related causes including esophagitis/gastritis, and musculoskeletal chest wall pain.    EKG, x-ray, laboratory results including troponin unremarkable.  Patient asymptomatic and currently voices no complaints.  On chart review, patient was seen 2 weeks ago for dizziness and placed on antibiotic for UTI.  Will obtain urinalysis, orthostatic vital signs and reassess.  Clinical Course as of Dec 31 606  Tue Dec 30, 2016  0606 Updated patient and spouse of urinalysis results.  Will add culture.  She was recently treated with Keflex; will treat with Cipro pending cultures.  Strict return precautions given.  Both verbalize understanding and agree with plan of care.  [JS]    Clinical Course User Index [JS] Irean Hong, MD     ____________________________________________   FINAL CLINICAL IMPRESSION(S) / ED DIAGNOSES  Final diagnoses:  Dehydration  Urinary tract infection without hematuria, site unspecified     ED Discharge Orders    None       Note:  This document was prepared using Dragon voice recognition software and may include unintentional dictation errors.    Irean Hong, MD 12/30/16 [phone]

## 2017-01-02 LAB — URINE CULTURE

## 2017-01-10 ENCOUNTER — Other Ambulatory Visit: Payer: Self-pay

## 2017-01-10 ENCOUNTER — Encounter: Payer: Self-pay | Admitting: Emergency Medicine

## 2017-01-10 ENCOUNTER — Emergency Department: Payer: Medicare Other

## 2017-01-10 ENCOUNTER — Observation Stay
Admission: EM | Admit: 2017-01-10 | Discharge: 2017-01-11 | Disposition: A | Discharge status: Home / Self Care | Payer: Medicare Other | Attending: Internal Medicine | Admitting: Internal Medicine

## 2017-01-10 DIAGNOSIS — E7800 Pure hypercholesterolemia, unspecified: Secondary | ICD-10-CM | POA: Insufficient documentation

## 2017-01-10 DIAGNOSIS — R079 Chest pain, unspecified: Principal | ICD-10-CM

## 2017-01-10 DIAGNOSIS — F419 Anxiety disorder, unspecified: Secondary | ICD-10-CM | POA: Insufficient documentation

## 2017-01-10 DIAGNOSIS — I088 Other rheumatic multiple valve diseases: Secondary | ICD-10-CM | POA: Diagnosis not present

## 2017-01-10 DIAGNOSIS — Z90710 Acquired absence of both cervix and uterus: Secondary | ICD-10-CM | POA: Insufficient documentation

## 2017-01-10 DIAGNOSIS — R0602 Shortness of breath: Secondary | ICD-10-CM | POA: Diagnosis not present

## 2017-01-10 DIAGNOSIS — Z8249 Family history of ischemic heart disease and other diseases of the circulatory system: Secondary | ICD-10-CM | POA: Diagnosis not present

## 2017-01-10 DIAGNOSIS — Z87891 Personal history of nicotine dependence: Secondary | ICD-10-CM | POA: Insufficient documentation

## 2017-01-10 DIAGNOSIS — Z7982 Long term (current) use of aspirin: Secondary | ICD-10-CM | POA: Insufficient documentation

## 2017-01-10 DIAGNOSIS — Z79899 Other long term (current) drug therapy: Secondary | ICD-10-CM | POA: Insufficient documentation

## 2017-01-10 DIAGNOSIS — R42 Dizziness and giddiness: Secondary | ICD-10-CM

## 2017-01-10 DIAGNOSIS — I10 Essential (primary) hypertension: Secondary | ICD-10-CM | POA: Diagnosis present

## 2017-01-10 DIAGNOSIS — F329 Major depressive disorder, single episode, unspecified: Secondary | ICD-10-CM | POA: Insufficient documentation

## 2017-01-10 DIAGNOSIS — R270 Ataxia, unspecified: Secondary | ICD-10-CM

## 2017-01-10 LAB — URINALYSIS, COMPLETE (UACMP) WITH MICROSCOPIC
BILIRUBIN URINE: NEGATIVE
Bacteria, UA: NONE SEEN
GLUCOSE, UA: NEGATIVE mg/dL
Hgb urine dipstick: NEGATIVE
KETONES UR: NEGATIVE mg/dL
Nitrite: NEGATIVE
PH: 7 (ref 5.0–8.0)
PROTEIN: NEGATIVE mg/dL
Specific Gravity, Urine: 1.009 (ref 1.005–1.030)

## 2017-01-10 LAB — CBC
HCT: 37.5 % (ref 35.0–47.0)
HEMOGLOBIN: 12.9 g/dL (ref 12.0–16.0)
MCH: 32 pg (ref 26.0–34.0)
MCHC: 34.4 g/dL (ref 32.0–36.0)
MCV: 93.2 fL (ref 80.0–100.0)
Platelets: 224 10*3/uL (ref 150–440)
RBC: 4.03 MIL/uL (ref 3.80–5.20)
RDW: 12.4 % (ref 11.5–14.5)
WBC: 6.5 10*3/uL (ref 3.6–11.0)

## 2017-01-10 LAB — BASIC METABOLIC PANEL
ANION GAP: 6 (ref 5–15)
BUN: 23 mg/dL — ABNORMAL HIGH (ref 6–20)
CHLORIDE: 105 mmol/L (ref 101–111)
CO2: 27 mmol/L (ref 22–32)
Calcium: 8.9 mg/dL (ref 8.9–10.3)
Creatinine, Ser: 0.86 mg/dL (ref 0.44–1.00)
GFR calc non Af Amer: 59 mL/min — ABNORMAL LOW (ref >60)
Glucose, Bld: 120 mg/dL — ABNORMAL HIGH (ref 65–99)
Potassium: 4 mmol/L (ref 3.5–5.1)
SODIUM: 138 mmol/L (ref 135–145)

## 2017-01-10 LAB — TROPONIN I
Troponin I: <0.03 ng/mL (ref <0.03)
Troponin I: <0.03 ng/mL (ref <0.03)
Troponin I: 0.03 ng/mL (ref <0.03)

## 2017-01-10 MED ORDER — ATORVASTATIN CALCIUM 10 MG PO TABS
10.0000 mg | ORAL_TABLET | Freq: Every day | ORAL | Status: DC
  Administered 2017-01-10: 10 mg via ORAL
  Filled 2017-01-10: qty 1

## 2017-01-10 MED ORDER — POTASSIUM CHLORIDE CRYS ER 10 MEQ PO TBCR
10.0000 meq | EXTENDED_RELEASE_TABLET | Freq: Every day | ORAL | Status: DC
  Administered 2017-01-10 – 2017-01-11 (×2): 10 meq via ORAL
  Filled 2017-01-10 (×2): qty 1

## 2017-01-10 MED ORDER — BISACODYL 10 MG RE SUPP: 10.0000 mg | Freq: Every day | RECTAL | Status: DC | PRN

## 2017-01-10 MED ORDER — AMLODIPINE BESYLATE 5 MG PO TABS
5.0000 mg | ORAL_TABLET | Freq: Every day | ORAL | Status: DC
  Administered 2017-01-10 – 2017-01-11 (×2): 5 mg via ORAL
  Filled 2017-01-10 (×2): qty 1

## 2017-01-10 MED ORDER — ACETAMINOPHEN 650 MG RE SUPP: 650.0000 mg | Freq: Four times a day (QID) | RECTAL | Status: DC | PRN

## 2017-01-10 MED ORDER — SODIUM CHLORIDE 0.9% FLUSH: 3.0000 mL | INTRAVENOUS | Status: DC | PRN

## 2017-01-10 MED ORDER — NITROGLYCERIN 2 % TD OINT
1.0000 [in_us] | TOPICAL_OINTMENT | Freq: Four times a day (QID) | TRANSDERMAL | Status: DC
  Administered 2017-01-10: 1 [in_us] via TOPICAL
  Filled 2017-01-10: qty 1
  Filled 2017-01-10: qty 30

## 2017-01-10 MED ORDER — SODIUM CHLORIDE 0.9% FLUSH
3.0000 mL | Freq: Two times a day (BID) | INTRAVENOUS | Status: DC
  Administered 2017-01-10 – 2017-01-11 (×3): 3 mL via INTRAVENOUS

## 2017-01-10 MED ORDER — HEPARIN SODIUM (PORCINE) 5000 UNIT/ML IJ SOLN
5000.0000 [IU] | Freq: Three times a day (TID) | INTRAMUSCULAR | Status: DC
  Administered 2017-01-10 – 2017-01-11 (×3): 5000 [IU] via SUBCUTANEOUS
  Filled 2017-01-10 (×3): qty 1

## 2017-01-10 MED ORDER — VENLAFAXINE HCL ER 150 MG PO CP24
150.0000 mg | ORAL_CAPSULE | Freq: Every day | ORAL | Status: DC
  Filled 2017-01-10 (×2): qty 1

## 2017-01-10 MED ORDER — NITROGLYCERIN 0.4 MG SL SUBL: 0.4000 mg | SUBLINGUAL_TABLET | SUBLINGUAL | Status: DC | PRN

## 2017-01-10 MED ORDER — ACETAMINOPHEN 325 MG PO TABS: 650.0000 mg | ORAL_TABLET | Freq: Four times a day (QID) | ORAL | Status: DC | PRN

## 2017-01-10 MED ORDER — PREGABALIN 50 MG PO CAPS
50.0000 mg | ORAL_CAPSULE | Freq: Three times a day (TID) | ORAL | Status: DC
  Administered 2017-01-10 – 2017-01-11 (×3): 50 mg via ORAL
  Filled 2017-01-10 (×3): qty 1

## 2017-01-10 MED ORDER — PANTOPRAZOLE SODIUM 40 MG PO TBEC
40.0000 mg | DELAYED_RELEASE_TABLET | Freq: Every day | ORAL | Status: DC
  Administered 2017-01-10 – 2017-01-11 (×2): 40 mg via ORAL
  Filled 2017-01-10 (×2): qty 1

## 2017-01-10 MED ORDER — ASPIRIN EC 81 MG PO TBEC
81.0000 mg | DELAYED_RELEASE_TABLET | Freq: Every day | ORAL | Status: DC
  Administered 2017-01-10 – 2017-01-11 (×2): 81 mg via ORAL
  Filled 2017-01-10 (×2): qty 1

## 2017-01-10 MED ORDER — MECLIZINE HCL 25 MG PO TABS
25.0000 mg | ORAL_TABLET | Freq: Three times a day (TID) | ORAL | Status: DC
  Administered 2017-01-10 – 2017-01-11 (×3): 25 mg via ORAL
  Filled 2017-01-10 (×5): qty 1

## 2017-01-10 MED ORDER — DOCUSATE SODIUM 100 MG PO CAPS
100.0000 mg | ORAL_CAPSULE | Freq: Two times a day (BID) | ORAL | Status: DC
  Administered 2017-01-10 (×2): 100 mg via ORAL
  Filled 2017-01-10 (×2): qty 1

## 2017-01-10 MED ORDER — ONDANSETRON HCL 4 MG PO TABS: 4.0000 mg | ORAL_TABLET | Freq: Four times a day (QID) | ORAL | Status: DC | PRN

## 2017-01-10 MED ORDER — SODIUM CHLORIDE 0.9 % IV SOLN
1000.0000 mL | Freq: Once | INTRAVENOUS | Status: AC
  Administered 2017-01-10: 1000 mL via INTRAVENOUS

## 2017-01-10 MED ORDER — CITALOPRAM HYDROBROMIDE 20 MG PO TABS
40.0000 mg | ORAL_TABLET | Freq: Every day | ORAL | Status: DC
  Administered 2017-01-10 – 2017-01-11 (×2): 40 mg via ORAL
  Filled 2017-01-10 (×2): qty 2

## 2017-01-10 MED ORDER — ONDANSETRON HCL 4 MG/2ML IJ SOLN: 4.0000 mg | Freq: Four times a day (QID) | INTRAMUSCULAR | Status: DC | PRN

## 2017-01-10 MED ORDER — LOSARTAN POTASSIUM 50 MG PO TABS
100.0000 mg | ORAL_TABLET | Freq: Every day | ORAL | Status: DC
  Administered 2017-01-10 – 2017-01-11 (×2): 100 mg via ORAL
  Filled 2017-01-10 (×2): qty 2

## 2017-01-10 MED ORDER — SODIUM CHLORIDE 0.9 % IV SOLN: 250.0000 mL | INTRAVENOUS | Status: DC | PRN

## 2017-01-10 MED ORDER — NITROGLYCERIN 0.4 MG SL SUBL: 0.4000 mg | SUBLINGUAL_TABLET | Freq: Once | SUBLINGUAL | Status: DC

## 2017-01-10 MED ORDER — METOPROLOL TARTRATE 25 MG PO TABS
25.0000 mg | ORAL_TABLET | Freq: Two times a day (BID) | ORAL | Status: DC
  Administered 2017-01-10 – 2017-01-11 (×2): 25 mg via ORAL
  Filled 2017-01-10 (×2): qty 1

## 2017-01-10 NOTE — H&P (Signed)
History and Physical    Deborah CatalanBetty G Gaxiola WUJ:[id number] DOB: 1927/12/25 DOA: 01/10/2017  Referring physician: Dr. Cyril LoosenKinner PCP: Jerl MinaHedrick, James, MD  Specialists: none  Chief Complaint: CP and dizziness  HPI: Deborah Martinez is a 83 y.o. female has a past medical history significant for HTN and anxiety now with dizziness and ataxia with intermittent CP. BP elevated in ER. Denies cardiac hx. No fever. No N/V/D. No SOB. Currently pain-free. Dizziness is worse with movement. She is now admitted  Review of Systems: The patient denies anorexia, fever, weight loss,, vision loss, decreased hearing, hoarseness,  syncope, dyspnea on exertion, peripheral edema,, hemoptysis, abdominal pain, melena, hematochezia, severe indigestion/heartburn, hematuria, incontinence, genital sores, muscle weakness, suspicious skin lesions, transient blindness,, depression, unusual weight change, abnormal bleeding, enlarged lymph nodes, angioedema, and breast masses.   Past Medical History:  Diagnosis Date  . Anxiety   . Constipation   . Cystocele   . Depression   . Hemorrhoid   . Hypercholesterolemia   . Hypertension   . Incomplete bladder emptying   . Postmenopausal atrophic vaginitis   . Rectocele   . Thyroid disease   . UTI (lower urinary tract infection)   . Vaginal enterocele    Past Surgical History:  Procedure Laterality Date  . APPENDECTOMY    . EYE SURGERY  2012   remve cataract  . TOTAL ABDOMINAL HYSTERECTOMY  1977   Social History:  reports that she has quit smoking. she has never used smokeless tobacco. She reports that she does not drink alcohol or use drugs.  No Known Allergies  Family History  Problem Relation Age of Onset  . Heart attack Father   . Diabetes Brother   . Cancer Neg Hx     Prior to Admission medications   Medication Sig Start Date End Date Taking? Authorizing Provider  alendronate (FOSAMAX) 70 MG tablet Take 70 mg by mouth once a week. Take with a full glass of water on an  empty stomach.   Yes [provider]  amLODipine (NORVASC) 5 MG tablet Take 5 mg by mouth daily.   Yes [provider]  aspirin 81 MG tablet Take 81 mg by mouth daily.   Yes [provider]  atorvastatin (LIPITOR) 10 MG tablet Take 10 mg by mouth. 11/25/11  Yes [provider]  citalopram (CELEXA) 40 MG tablet Take 40 mg by mouth daily.   Yes [provider]  lidocaine (LIDODERM) 5 % Place 1 patch onto the skin daily. Apply 1 patch and remove after 12 hours   Yes [provider]  losartan (COZAAR) 100 MG tablet Take 100 mg by mouth daily.   Yes [provider]  meloxicam (MOBIC) 15 MG tablet Take 15 mg by mouth daily.   Yes [provider]  potassium chloride (K-DUR) 10 MEQ tablet Take 10 mEq by mouth daily.    Yes [provider]  pregabalin (LYRICA) 50 MG capsule Take 50 mg by mouth 3 (three) times daily.   Yes [provider]  venlafaxine XR (EFFEXOR-XR) 150 MG 24 hr capsule venlafaxine ER 37.5 mg capsule,extended release 24 hr  Start 1 tablet po q day for 1 week then 2 tablets po q day x1 week and continue titrating to 150 mg po q day   Yes [provider]  ciprofloxacin (CIPRO) 500 MG tablet Take 1 tablet (500 mg total) by mouth 2 (two) times daily. Patient not taking: Reported on 01/10/2017 12/30/16   Chiquita LothSung, Jade  J, MD  conjugated estrogens (PREMARIN) vaginal cream Place 0.25 Applicatorfuls vaginally once a week. 1/2 gram weekly Patient not taking: Reported on 01/10/2017 03/06/15   Defrancesco, Prentice DockerMartin A, MD   Physical Exam: Vitals:   01/10/17 0800 01/10/17 0830 01/10/17 0900 01/10/17 0930  BP: (!) 141/89 (!) 147/90 (!) 147/87 (!) 147/94  Pulse: 73 72 77 79  Resp: 15 14 14 13   Temp:      TempSrc:      SpO2: 97% 99% 96% 96%  Weight:      Height:         General:  No apparent distress, WDWN, [place]/AT  Eyes: PERRL, EOMI, no scleral icterus, conjunctiva clear  ENT: moist oropharynx  without exudate, TM's benign, dentition good  Neck: supple, no lymphadenopathy. No bruits or thyromegaly  Cardiovascular: regular rate without MRG; 2+ peripheral pulses, no JVD, no peripheral edema  Respiratory: CTA biL, good air movement without wheezing, rhonchi or crackled. Respiratory effort normal  Abdomen: soft, non tender to palpation, positive bowel sounds, no guarding, no rebound  Skin: no rashes or lesions  Musculoskeletal: normal bulk and tone, no joint swelling  Psychiatric: normal mood and affect, A&)X3  Neurologic: CN 2-12 grossly intact, Motor strength 5/5 in all 4 groups with symmetric DTR's and non-focal sensory exam  Labs on Admission:  Basic Metabolic Panel: Recent Labs  Lab 01/10/17 0719  NA 138  K 4.0  CL 105  CO2 27  GLUCOSE 120*  BUN 23*  CREATININE 0.86  CALCIUM 8.9   Liver Function Tests: No results for input(s): AST, ALT, ALKPHOS, BILITOT, PROT, ALBUMIN in the last 168 hours. No results for input(s): LIPASE, AMYLASE in the last 168 hours. No results for input(s): AMMONIA in the last 168 hours. CBC: Recent Labs  Lab 01/10/17 0719  WBC 6.5  HGB 12.9  HCT 37.5  MCV 93.2  PLT 224   Cardiac Enzymes: Recent Labs  Lab 01/10/17 0719  TROPONINI <0.03    BNP (last 3 results) No results for input(s): BNP in the last 8760 hours.  ProBNP (last 3 results) No results for input(s): PROBNP in the last 8760 hours.  CBG: No results for input(s): GLUCAP in the last 168 hours.  Radiological Exams on Admission: No results found.  EKG: Independently reviewed.  Assessment/Plan Principal Problem:   Chest pain Active Problems:   Hypertension   Dizziness   Ataxia   Will observe on floor with telemetry and follow enzymes and order echo and Cardiology consult. Optimize BP regimen. Begin Meclizine and obtain head CT. Consult PT  Diet: low salt Fluids: saline lock DVT Prophylaxis: SQ Heparin  Code Status: FULL  Family Communication: yes   Disposition Plan: home  Time spent: 50 min

## 2017-01-10 NOTE — Progress Notes (Signed)
MD paged to notify of positive troponin of 0.03. Waiting for return call

## 2017-01-10 NOTE — ED Notes (Signed)
Pt cleaned up of urine - the external catheter had become dislodged.

## 2017-01-10 NOTE — Progress Notes (Signed)
MD returned 2nd page. No new orders placed. Will cont to monitor

## 2017-01-10 NOTE — ED Provider Notes (Signed)
[facility] Emergency Department Provider Note   ____________________________________________    I have reviewed the triage vital signs and the nursing notes.   HISTORY  Chief Complaint Dizziness     HPI Deborah Martinez is a 83 y.o. female who presents with complaints of dizziness.  Patient reports this morning she stood up out of bed and felt lightheaded and dizzy.  She felt better when she lied back down.  Denies palpitations.  Recent ED visit for similar according to medical records.  Denies chest pain.  No shortness of breath.  Feels quite well now.  No recent nausea vomiting or diarrhea.  Does report that she does not drink as much water as she probably should.  No fevers or chills.   Past Medical History:  Diagnosis Date  . Anxiety   . Constipation   . Cystocele   . Depression   . Hemorrhoid   . Hypercholesterolemia   . Hypertension   . Incomplete bladder emptying   . Postmenopausal atrophic vaginitis   . Rectocele   . Thyroid disease   . UTI (lower urinary tract infection)   . Vaginal enterocele     Patient Active Problem List   Diagnosis Date Noted  . Chest pain 01/10/2017  . Dizziness 01/10/2017  . Ataxia 01/10/2017  . Cystocele 07/27/2014  . Menopause 07/27/2014  . Vaginal atrophy 07/27/2014  . Incomplete bladder emptying 07/27/2014  . Anxiety and depression 07/27/2014  . Constipation 07/27/2014  . Hypertension 07/27/2014  . Hypercholesterolemia 07/27/2014  . Hemorrhoids 07/27/2014  . Rectocele 07/27/2014  . Vaginal enterocele 07/27/2014    Past Surgical History:  Procedure Laterality Date  . APPENDECTOMY    . EYE SURGERY  2012   remve cataract  . TOTAL ABDOMINAL HYSTERECTOMY  1977    Prior to Admission medications   Medication Sig Start Date End Date Taking? Authorizing Provider  alendronate (FOSAMAX) 70 MG tablet Take 70 mg by mouth once a week. Take with a full glass of water on an empty stomach.   Yes [provider]  amLODipine (NORVASC) 5 MG tablet Take 5 mg by mouth daily.   Yes [provider]  aspirin 81 MG tablet Take 81 mg by mouth daily.   Yes [provider]  atorvastatin (LIPITOR) 10 MG tablet Take 10 mg by mouth. 11/25/11  Yes [provider]  citalopram (CELEXA) 40 MG tablet Take 40 mg by mouth daily.   Yes [provider]  lidocaine (LIDODERM) 5 % Place 1 patch onto the skin daily. Apply 1 patch and remove after 12 hours   Yes [provider]  losartan (COZAAR) 100 MG tablet Take 100 mg by mouth daily.   Yes [provider]  meloxicam (MOBIC) 15 MG tablet Take 15 mg by mouth daily.   Yes [provider]  potassium chloride (K-DUR) 10 MEQ tablet Take 10 mEq by mouth daily.    Yes [provider]  pregabalin (LYRICA) 50 MG capsule Take 50 mg by mouth 3 (three) times daily.   Yes [provider]  venlafaxine XR (EFFEXOR-XR) 150 MG 24 hr capsule venlafaxine ER 37.5 mg capsule,extended release 24 hr  Start 1 tablet po q day for 1 week then 2 tablets po q day x1 week and continue titrating to 150 mg po q day   Yes [provider]  ciprofloxacin (CIPRO) 500 MG tablet Take 1 tablet (500 mg total) by mouth 2 (two) times daily.  Patient not taking: Reported on 01/10/2017 12/30/16   Irean Hong, MD  conjugated estrogens (PREMARIN) vaginal cream Place 0.25 Applicatorfuls vaginally once a week. 1/2 gram weekly Patient not taking: Reported on 01/10/2017 03/06/15   Defrancesco, Prentice Docker, MD     Allergies Patient has no known allergies.  Family History  Problem Relation Age of Onset  . Heart attack Father   . Diabetes Brother   . Cancer Neg Hx     Social History Social History   Tobacco Use  . Smoking status: Former Games developer  . Smokeless tobacco: Never Used  Substance Use Topics  . Alcohol use: No  . Drug use: No    Review of Systems  Constitutional: Dizziness as above Eyes: No visual  changes.  ENT: No sore throat. Cardiovascular: Denies chest pain. Respiratory: Denies cough or shortness of breath Gastrointestinal:  No nausea, no vomiting.   Genitourinary: Negative for dysuria. Musculoskeletal: Negative for back pain. Skin: Negative for rash. Neurological: Negative for headaches or weakness   ____________________________________________   PHYSICAL EXAM:  VITAL SIGNS: ED Triage Vitals  Enc Vitals Group     BP 01/10/17 0712 (!) 147/92     Pulse Rate 01/10/17 0712 86     Resp 01/10/17 0712 16     Temp 01/10/17 0712 98.2 F (36.8 C)     Temp Source 01/10/17 0712 Oral     SpO2 01/10/17 0709 98 %     Weight 01/10/17 0713 60.8 kg (134 lb)     Height 01/10/17 0713 1.676 m (5\' 6" )     Head Circumference --      Peak Flow --      Pain Score 01/10/17 0712 0     Pain Loc --      Pain Edu? --      Excl. in GC? --     Constitutional: Alert and oriented. No acute distress. Pleasant and interactive Eyes: Conjunctivae are normal.   Nose: No congestion/rhinnorhea. Mouth/Throat: Mucous membranes are moist.    Cardiovascular: Normal rate, regular rhythm. Grossly normal heart sounds.  Good peripheral circulation. Respiratory: Normal respiratory effort.  No retractions. Lungs CTAB. Gastrointestinal: Soft and nontender. No distention.  No CVA tenderness. Genitourinary: deferred Musculoskeletal:  Warm and well perfused Neurologic:  Normal speech and language. No gross focal neurologic deficits are appreciated.  Skin:  Skin is warm, dry and intact. No rash noted. Psychiatric: Mood and affect are normal. Speech and behavior are normal.  ____________________________________________   LABS (all labs ordered are listed, but only abnormal results are displayed)  Labs Reviewed  BASIC METABOLIC PANEL - Abnormal; Notable for the following components:      Result Value   Glucose, Bld 120 (*)    BUN 23 (*)    GFR calc non Af Amer 59 (*)    All other components within  normal limits  URINALYSIS, COMPLETE (UACMP) WITH MICROSCOPIC - Abnormal; Notable for the following components:   Color, Urine YELLOW (*)    APPearance HAZY (*)    Leukocytes, UA LARGE (*)    Squamous Epithelial / LPF 6-30 (*)    All other components within normal limits  CBC  TROPONIN I  CBG MONITORING, ED   ____________________________________________  EKG  ED ECG REPORT I, Jene Every, the attending physician, personally viewed and interpreted this ECG.  Date: 01/10/2017 EKG Time: 7:11 AM Rate: 85 Rhythm: normal sinus rhythm QRS Axis: normal Intervals: normal ST/T Wave abnormalities: normal   ____________________________________________  RADIOLOGY  None ____________________________________________   PROCEDURES  Procedure(s) performed: No  Procedures   Critical Care performed: No ____________________________________________   INITIAL IMPRESSION / ASSESSMENT AND PLAN / ED COURSE  Pertinent labs & imaging results that were available during my care of the patient were reviewed by me and considered in my medical decision making (see chart for details).  Patient well-appearing and in no acute distress.  Complains of lightheadedness primarily with standing, likely orthostatic.  More concerning is that she complains of intermittent chest pain and notes several episodes while in the emergency department.  Repeat EKG is unchanged, nitroglycerin given for chest tightness.  Will admit for observation    ____________________________________________   FINAL CLINICAL IMPRESSION(S) / ED DIAGNOSES  Final diagnoses:  Dizziness  Chest pain, unspecified type        Note:  This document was prepared using Dragon voice recognition software and may include unintentional dictation errors.     Jene EveryKinner, Halyn Flaugher, MD 01/10/17 [phone]

## 2017-01-10 NOTE — Plan of Care (Signed)
  Progressing Education: Knowledge of General Education information will improve 01/10/2017 1601 - Progressing by Tomie ChinaJackson, Kalana Yust Cecelie, RN Health Behavior/Discharge Planning: Ability to manage health-related needs will improve 01/10/2017 1601 - Progressing by Tomie ChinaJackson, Tonique Mendonca Cecelie, RN Clinical Measurements: Will remain free from infection 01/10/2017 1601 - Progressing by Tomie ChinaJackson, Kamdin Follett Cecelie, RN Activity: Risk for activity intolerance will decrease 01/10/2017 1601 - Progressing by Tomie ChinaJackson, Takako Minckler Cecelie, RN Coping: Level of anxiety will decrease 01/10/2017 1601 - Progressing by Tomie ChinaJackson, Mahira Petras Cecelie, RN Elimination: Will not experience complications related to bowel motility 01/10/2017 1601 - Progressing by Tomie ChinaJackson, Siona Coulston Cecelie, RN Will not experience complications related to urinary retention 01/10/2017 1601 - Progressing by Tomie ChinaJackson, Tamela Elsayed Cecelie, RN Pain Managment: General experience of comfort will improve 01/10/2017 1601 - Progressing by Tomie ChinaJackson, Ermalinda Joubert Cecelie, RN Safety: Ability to remain free from injury will improve 01/10/2017 1601 - Progressing by Tomie ChinaJackson, Bryson Gavia Cecelie, RN Skin Integrity: Risk for impaired skin integrity will decrease 01/10/2017 1601 - Progressing by Tomie ChinaJackson, Harue Pribble Cecelie, RN

## 2017-01-10 NOTE — ED Triage Notes (Signed)
Patient from home via [facility] with complaint of dizziness/nausea/vomiting since last night. Patient states it has worsened throughout the night. This morning patient is unable to stand due to dizziness.

## 2017-01-11 ENCOUNTER — Observation Stay
Admit: 2017-01-11 | Discharge: 2017-01-11 | Disposition: A | Discharge status: Home / Self Care | Payer: Medicare Other | Attending: Internal Medicine | Admitting: Internal Medicine

## 2017-01-11 DIAGNOSIS — R079 Chest pain, unspecified: Secondary | ICD-10-CM | POA: Diagnosis not present

## 2017-01-11 LAB — CBC
HEMATOCRIT: 34.5 % — AB (ref 35.0–47.0)
Hemoglobin: 11.8 g/dL — ABNORMAL LOW (ref 12.0–16.0)
MCH: 31.9 pg (ref 26.0–34.0)
MCHC: 34.3 g/dL (ref 32.0–36.0)
MCV: 93 fL (ref 80.0–100.0)
PLATELETS: 209 10*3/uL (ref 150–440)
RBC: 3.71 MIL/uL — ABNORMAL LOW (ref 3.80–5.20)
RDW: 12.4 % (ref 11.5–14.5)
WBC: 6 10*3/uL (ref 3.6–11.0)

## 2017-01-11 LAB — COMPREHENSIVE METABOLIC PANEL
ALBUMIN: 3.2 g/dL — AB (ref 3.5–5.0)
ALT: 23 U/L (ref 14–54)
AST: 26 U/L (ref 15–41)
Alkaline Phosphatase: 49 U/L (ref 38–126)
Anion gap: 5 (ref 5–15)
BUN: 18 mg/dL (ref 6–20)
CHLORIDE: 107 mmol/L (ref 101–111)
CO2: 25 mmol/L (ref 22–32)
Calcium: 8 mg/dL — ABNORMAL LOW (ref 8.9–10.3)
Creatinine, Ser: 0.74 mg/dL (ref 0.44–1.00)
GFR calc Af Amer: >60 mL/min (ref >60)
GFR calc non Af Amer: >60 mL/min (ref >60)
GLUCOSE: 106 mg/dL — AB (ref 65–99)
POTASSIUM: 3.8 mmol/L (ref 3.5–5.1)
SODIUM: 137 mmol/L (ref 135–145)
Total Bilirubin: 0.8 mg/dL (ref 0.3–1.2)
Total Protein: 5.4 g/dL — ABNORMAL LOW (ref 6.5–8.1)

## 2017-01-11 LAB — TROPONIN I: TROPONIN I: 0.03 ng/mL — AB (ref <0.03)

## 2017-01-11 LAB — ECHOCARDIOGRAM COMPLETE
HEIGHTINCHES: 66 in
Weight: 2236.8 oz

## 2017-01-11 MED ORDER — AMLODIPINE BESYLATE 10 MG PO TABS: 5.0000 mg | ORAL_TABLET | Freq: Every day | ORAL | 0 refills | Status: DC

## 2017-01-11 MED ORDER — NITROGLYCERIN 2 % TD OINT: 1.0000 [in_us] | TOPICAL_OINTMENT | Freq: Four times a day (QID) | TRANSDERMAL | Status: DC

## 2017-01-11 NOTE — Evaluation (Signed)
Physical Therapy Evaluation Patient Details Name: Deborah CatalanBetty G Martinez MRN: [id number] DOB: 31-Mar-1927 Today's Date: 01/11/2017   History of Present Illness  Pt is a 83 y/o F who presented with dizziness and ataxia with intermittent chest pain.  Patient was ruled out for acute coronary syndrome. CT head showed no acute abnormality.  Pt's PMH includes anxiety, depression.    Clinical Impression  Pt admitted with above diagnosis. Pt currently with functional limitations due to the deficits listed below (see PT Problem List). Deborah Martinez presents with instability when ambulating, requiring intermittent min assist to steady.  Recommending HHPT at d/c to address instability. Pt will benefit from skilled PT to increase their independence and safety with mobility to allow discharge to the venue listed below.      Follow Up Recommendations Home health PT;Supervision for mobility/OOB    Equipment Recommendations  None recommended by PT    Recommendations for Other Services       Precautions / Restrictions Precautions Precautions: Fall Restrictions Weight Bearing Restrictions: No      Mobility  Bed Mobility Overal bed mobility: Independent             General bed mobility comments: No physical assist or cues needed.  Pt performs independently.   Transfers Overall transfer level: Needs assistance Equipment used: None Transfers: Sit to/from Stand Sit to Stand: Supervision         General transfer comment: Supervision for safety.  Pt steady with no signs of instability.  Very mild dizziness upon standing but this improves.    Ambulation/Gait Ambulation/Gait assistance: Min assist Ambulation Distance (Feet): 140 Feet Assistive device: None Gait Pattern/deviations: Step-through pattern;Staggering left;Staggering right   Gait velocity interpretation: at or above normal speed for age/gender General Gait Details: Pt occasionally unsteady, staggering to the R with assist provided to  correct.    Stairs            Wheelchair Mobility    Modified Rankin (Stroke Patients Only) Modified Rankin (Stroke Patients Only) Pre-Morbid Rankin Score: No symptoms Modified Rankin: Moderately severe disability     Balance Overall balance assessment: Needs assistance Sitting-balance support: No upper extremity supported;Feet supported Sitting balance-Leahy Scale: Good     Standing balance support: No upper extremity supported;During functional activity Standing balance-Leahy Scale: Fair Standing balance comment: Pt able to stand statically but requires intermittent assist when ambulating due to unsteadiness.                              Pertinent Vitals/Pain Pain Assessment: No/denies pain    Home Living Family/patient expects to be discharged to:: Private residence Living Arrangements: Spouse/significant other Available Help at Discharge: Family;Available 24 hours/day Type of Home: Independent living facility Home Access: Level entry     Home Layout: One level Home Equipment: Cane - single point      Prior Function Level of Independence: Independent         Comments: Pt reports she was Ind PTA with all aspects of mobility.  Food is provided by facility.  Pt no longer drives.       Hand Dominance        Extremity/Trunk Assessment   Upper Extremity Assessment Upper Extremity Assessment: (BUE strength grossly 4/5)    Lower Extremity Assessment Lower Extremity Assessment: (BLE strength grossly 4-/5)       Communication   Communication: No difficulties  Cognition Arousal/Alertness: Awake/alert Behavior During Therapy: WFL for tasks assessed/performed  Overall Cognitive Status: No family/caregiver present to determine baseline cognitive functioning Area of Impairment: Orientation;Safety/judgement                 Orientation Level: Disoriented to;Situation;Place       Safety/Judgement: Decreased awareness of  safety;Decreased awareness of deficits            General Comments General comments (skin integrity, edema, etc.): BP in supine at start of session 166/94, sitting EOB 154/98, standing at bedside 150/95.     Exercises     Assessment/Plan    PT Assessment Patient needs continued PT services  PT Problem List Decreased strength;Decreased balance;Decreased safety awareness;Decreased knowledge of use of DME       PT Treatment Interventions DME instruction;Gait training;Stair training;Functional mobility training;Therapeutic activities;Therapeutic exercise;Balance training;Neuromuscular re-education;Patient/family education    PT Goals (Current goals can be found in the Care Plan section)  Acute Rehab PT Goals Patient Stated Goal: to go home PT Goal Formulation: With patient Time For Goal Achievement: 01/25/17 Potential to Achieve Goals: Good    Frequency Min 2X/week   Barriers to discharge        Co-evaluation               AM-PAC PT "6 Clicks" Daily Activity  Outcome Measure Difficulty turning over in bed (including adjusting bedclothes, sheets and blankets)?: None Difficulty moving from lying on back to sitting on the side of the bed? : None Difficulty sitting down on and standing up from a chair with arms (e.g., wheelchair, bedside commode, etc,.)?: A Little Help needed moving to and from a bed to chair (including a wheelchair)?: A Little Help needed walking in hospital room?: A Little Help needed climbing 3-5 steps with a railing? : A Little 6 Click Score: 20    End of Session Equipment Utilized During Treatment: Gait belt Activity Tolerance: Patient tolerated treatment well Patient left: in chair;with call bell/phone within reach;with chair alarm set Nurse Communication: Mobility status;Other (comment)(BP readings) PT Visit Diagnosis: Unsteadiness on feet (R26.81);Other abnormalities of gait and mobility (R26.89)    Time: [id number] PT Time Calculation  (min) (ACUTE ONLY): 25 min   Charges:   PT Evaluation $PT Eval Low Complexity: 1 Low PT Treatments $Gait Training: 8-22 mins   PT G Codes:   PT G-Codes **NOT FOR INPATIENT CLASS** Functional Assessment Tool Used: AM-PAC 6 Clicks Basic Mobility;Clinical judgement Functional Limitation: Mobility: Walking and moving around Mobility: Walking and Moving Around Current Status ([id number]): At least 20 percent but less than 40 percent impaired, limited or restricted Mobility: Walking and Moving Around Goal Status [phone]): At least 1 percent but less than 20 percent impaired, limited or restricted    Encarnacion ChuAshley Hortense Cantrall PT, DPT 01/11/2017, 11:26 AM

## 2017-01-11 NOTE — Consult Note (Signed)
Deborah CatalanBetty G Martinez is a 83 y.o. female  [id number]  Primary Cardiologist: Adrian BlackwaterShaukat Rance Smithson Reason for Consultation: Chest pain  HPI: 83 year old female presented to the hospital with chest pain associated with shortness of breath.    Review of Systems: Occasional dizziness   Past Medical History:  Diagnosis Date  . Anxiety   . Constipation   . Cystocele   . Depression   . Hemorrhoid   . Hypercholesterolemia   . Hypertension   . Incomplete bladder emptying   . Postmenopausal atrophic vaginitis   . Rectocele   . Thyroid disease   . UTI (lower urinary tract infection)   . Vaginal enterocele     Medications Prior to Admission  Medication Sig Dispense Refill  . alendronate (FOSAMAX) 70 MG tablet Take 70 mg by mouth once a week. Take with a full glass of water on an empty stomach.    Marland Kitchen. amLODipine (NORVASC) 5 MG tablet Take 5 mg by mouth daily.    Marland Kitchen. aspirin 81 MG tablet Take 81 mg by mouth daily.    Marland Kitchen. atorvastatin (LIPITOR) 10 MG tablet Take 10 mg by mouth.    . citalopram (CELEXA) 40 MG tablet Take 40 mg by mouth daily.    Marland Kitchen. lidocaine (LIDODERM) 5 % Place 1 patch onto the skin daily. Apply 1 patch and remove after 12 hours    . losartan (COZAAR) 100 MG tablet Take 100 mg by mouth daily.    . meloxicam (MOBIC) 15 MG tablet Take 15 mg by mouth daily.    . potassium chloride (K-DUR) 10 MEQ tablet Take 10 mEq by mouth daily.     . pregabalin (LYRICA) 50 MG capsule Take 50 mg by mouth 3 (three) times daily.    Marland Kitchen. venlafaxine XR (EFFEXOR-XR) 150 MG 24 hr capsule venlafaxine ER 37.5 mg capsule,extended release 24 hr  Start 1 tablet po q day for 1 week then 2 tablets po q day x1 week and continue titrating to 150 mg po q day    . ciprofloxacin (CIPRO) 500 MG tablet Take 1 tablet (500 mg total) by mouth 2 (two) times daily. (Patient not taking: Reported on 01/10/2017) 14 tablet 0  . conjugated estrogens (PREMARIN) vaginal cream Place 0.25 Applicatorfuls vaginally once a week. 1/2 gram weekly  (Patient not taking: Reported on 01/10/2017) 42.5 g 1     . amLODipine  5 mg Oral Daily  . aspirin EC  81 mg Oral Daily  . atorvastatin  10 mg Oral q1800  . citalopram  40 mg Oral Daily  . docusate sodium  100 mg Oral BID  . heparin  5,000 Units Subcutaneous Q8H  . losartan  100 mg Oral Daily  . meclizine  25 mg Oral TID  . metoprolol tartrate  25 mg Oral BID  . nitroGLYCERIN  1 inch Topical Q6H  . pantoprazole  40 mg Oral Daily  . potassium chloride  10 mEq Oral Daily  . pregabalin  50 mg Oral TID  . sodium chloride flush  3 mL Intravenous Q12H  . venlafaxine XR  150 mg Oral Q breakfast    Infusions: . sodium chloride      No Known Allergies  Social History   Socioeconomic History  . Marital status: Married    Spouse name: Not on file  . Number of children: Not on file  . Years of education: Not on file  . Highest education level: Not on file  Social Needs  . Financial  resource strain: Not on file  . Food insecurity - worry: Not on file  . Food insecurity - inability: Not on file  . Transportation needs - medical: Not on file  . Transportation needs - non-medical: Not on file  Occupational History  . Not on file  Tobacco Use  . Smoking status: Former Games developermoker  . Smokeless tobacco: Never Used  Substance and Sexual Activity  . Alcohol use: No  . Drug use: No  . Sexual activity: No  Other Topics Concern  . Not on file  Social History Narrative  . Not on file    Family History  Problem Relation Age of Onset  . Heart attack Father   . Diabetes Brother   . Cancer Neg Hx     PHYSICAL EXAM: Vitals:   01/11/17 0543 01/11/17 1051  BP: 138/76 138/73  Pulse: 60 63  Resp: 18 18  Temp: 98 F (36.7 C) 97.9 F (36.6 C)  SpO2: 97% 95%     Intake/Output Summary (Last 24 hours) at 01/11/2017 1148 Last data filed at 01/11/2017 1007 Gross per 24 hour  Intake 840 ml  Output -  Net 840 ml    General:  Well appearing. No respiratory difficulty HEENT:  normal Neck: supple. no JVD. Carotids 2+ bilat; no bruits. No lymphadenopathy or thryomegaly appreciated. Cor: PMI nondisplaced. Regular rate & rhythm. No rubs, gallops or murmurs. Lungs: clear Abdomen: soft, nontender, nondistended. No hepatosplenomegaly. No bruits or masses. Good bowel sounds. Extremities: no cyanosis, clubbing, rash, edema Neuro: alert & oriented x 3, cranial nerves grossly intact. moves all 4 extremities w/o difficulty. Affect pleasant.  ECG: Sinus rhythm with nonspecific ST-T changes  Results for orders placed or performed during the hospital encounter of 01/10/17 (from the past 24 hour(s))  Troponin I     Status: None   Collection Time: 01/10/17  3:50 PM  Result Value Ref Range   Troponin I <0.03 <0.03 ng/mL  Troponin I     Status: Abnormal   Collection Time: 01/10/17  9:17 PM  Result Value Ref Range   Troponin I 0.03 (HH) <0.03 ng/mL  Comprehensive metabolic panel     Status: Abnormal   Collection Time: 01/11/17  4:58 AM  Result Value Ref Range   Sodium 137 135 - 145 mmol/L   Potassium 3.8 3.5 - 5.1 mmol/L   Chloride 107 101 - 111 mmol/L   CO2 25 22 - 32 mmol/L   Glucose, Bld 106 (H) 65 - 99 mg/dL   BUN 18 6 - 20 mg/dL   Creatinine, Ser 6.040.74 0.44 - 1.00 mg/dL   Calcium 8.0 (L) 8.9 - 10.3 mg/dL   Total Protein 5.4 (L) 6.5 - 8.1 g/dL   Albumin 3.2 (L) 3.5 - 5.0 g/dL   AST 26 15 - 41 U/L   ALT 23 14 - 54 U/L   Alkaline Phosphatase 49 38 - 126 U/L   Total Bilirubin 0.8 0.3 - 1.2 mg/dL   GFR calc non Af Amer >60 >60 mL/min   GFR calc Af Amer >60 >60 mL/min   Anion gap 5 5 - 15  CBC     Status: Abnormal   Collection Time: 01/11/17  4:58 AM  Result Value Ref Range   WBC 6.0 3.6 - 11.0 K/uL   RBC 3.71 (L) 3.80 - 5.20 MIL/uL   Hemoglobin 11.8 (L) 12.0 - 16.0 g/dL   HCT 54.034.5 (L) 98.135.0 - 19.147.0 %   MCV 93.0 80.0 - 100.0 fL  MCH 31.9 26.0 - 34.0 pg   MCHC 34.3 32.0 - 36.0 g/dL   RDW 16.1 09.6 - 04.5 %   Platelets 209 150 - 440 K/uL  Troponin I     Status:  Abnormal   Collection Time: 01/11/17  4:58 AM  Result Value Ref Range   Troponin I 0.03 (HH) <0.03 ng/mL   Ct Head Wo Contrast  Result Date: 01/10/2017 CLINICAL DATA:  Dizziness. EXAM: CT HEAD WITHOUT CONTRAST TECHNIQUE: Contiguous axial images were obtained from the base of the skull through the vertex without intravenous contrast. COMPARISON:  None. FINDINGS: Brain: No evidence of acute infarction, hemorrhage, hydrocephalus, extra-axial collection or mass lesion/mass effect. Mild-to-moderate age-related cerebral atrophy with compensatory dilatation of the ventricles. Moderate periventricular white matter and corona radiata hypodensities favor chronic ischemic microvascular white matter disease. Vascular: Atherosclerotic vascular calcification of the carotid siphons. No hyperdense vessel. Skull: Negative for fracture or focal lesion. Sinuses/Orbits: No acute finding. Trace mucosal thickening in the right sphenoid sinus. Other: None. IMPRESSION: 1. No acute intracranial abnormality. Mild-to-moderate cerebral atrophy and chronic microvascular ischemic white matter disease. Electronically Signed   By: Obie Dredge M.D.   On: 01/10/2017 13:58     ASSESSMENT AND PLAN: Chest pain with borderline cardiac markers and no acute changes on EKG. Patient has no further chest pain and can be discharged with follow-up in the office Thursday at 2 PM.  [facility]

## 2017-01-11 NOTE — Discharge Summary (Signed)
[facility] [place] [facility]   PATIENT NAME: Deborah Martinez    MR#:  [id number]  DATE OF BIRTH:  05-29-1927  DATE OF ADMISSION:  01/10/2017 ADMITTING PHYSICIAN: Marguarite ArbourJeffrey D Sparks, MD  DATE OF DISCHARGE: 01/01/2017  PRIMARY CARE PHYSICIAN: Jerl MinaHedrick, James, MD    ADMISSION DIAGNOSIS:  Dizziness [R42] Chest pain, unspecified type [R07.9]  DISCHARGE DIAGNOSIS:  Principal Problem:   Chest pain Active Problems:   Hypertension   Dizziness   Ataxia   SECONDARY DIAGNOSIS:   Past Medical History:  Diagnosis Date  . Anxiety   . Constipation   . Cystocele   . Depression   . Hemorrhoid   . Hypercholesterolemia   . Hypertension   . Incomplete bladder emptying   . Postmenopausal atrophic vaginitis   . Rectocele   . Thyroid disease   . UTI (lower urinary tract infection)   . Vaginal enterocele     HOSPITAL COURSE:   83 year old female with history of anxiety and essential hypertension who presents for dizziness and chest pain.  1. Chest pain: Patient was ruled out for acute coronary syndrome. Troponins were negative and telemetry showed no acute changes. She was evaluated by cardiology. She will have outpatient cardiology follow-up.  2. Dizziness: This has improved. She was not found to be significantly orthostatic. She had no neurological deficits.  3. Accelerated essential hypertension: Norvasc has been increased to 10 mg daily. She will continue losartan. She will follow up with her primary care physician early next week for blood pressure check.  4. Anxiety and depression: Patient will continue on Celexa and Effexor DISCHARGE CONDITIONS AND DIET:   Stable for discharge on heart healthy diet  CONSULTS OBTAINED:  Treatment Team:  Laurier NancyKhan, Shaukat A, MD  DRUG ALLERGIES:  No Known Allergies  DISCHARGE MEDICATIONS:   Allergies as of 01/11/2017   No Known Allergies     Medication List    STOP taking these medications   ciprofloxacin 500 MG  tablet Commonly known as:  CIPRO   conjugated estrogens vaginal cream Commonly known as:  PREMARIN     TAKE these medications   alendronate 70 MG tablet Commonly known as:  FOSAMAX Take 70 mg by mouth once a week. Take with a full glass of water on an empty stomach.   amLODipine 10 MG tablet Commonly known as:  NORVASC Take 0.5 tablets (5 mg total) by mouth daily. What changed:  medication strength   aspirin 81 MG tablet Take 81 mg by mouth daily.   atorvastatin 10 MG tablet Commonly known as:  LIPITOR Take 10 mg by mouth.   citalopram 40 MG tablet Commonly known as:  CELEXA Take 40 mg by mouth daily.   lidocaine 5 % Commonly known as:  LIDODERM Place 1 patch onto the skin daily. Apply 1 patch and remove after 12 hours   losartan 100 MG tablet Commonly known as:  COZAAR Take 100 mg by mouth daily.   meloxicam 15 MG tablet Commonly known as:  MOBIC Take 15 mg by mouth daily.   potassium chloride 10 MEQ tablet Commonly known as:  K-DUR Take 10 mEq by mouth daily.   pregabalin 50 MG capsule Commonly known as:  LYRICA Take 50 mg by mouth 3 (three) times daily.   venlafaxine XR 150 MG 24 hr capsule Commonly known as:  EFFEXOR-XR venlafaxine ER 37.5 mg capsule,extended release 24 hr  Start 1 tablet po q day for 1 week then 2 tablets po q day  x1 week and continue titrating to 150 mg po q day         Today   CHIEF COMPLAINT:   Patient denies dizziness or lightheadedness this morning   VITAL SIGNS:  Blood pressure 138/76, pulse 60, temperature 98 F (36.7 C), temperature source Oral, resp. rate 18, height 5\' 6"  (1.676 m), weight 63.4 kg (139 lb 12.8 oz), SpO2 97 %.   REVIEW OF SYSTEMS:  Review of Systems  Constitutional: Negative.  Negative for chills, fever and malaise/fatigue.  HENT: Negative.  Negative for ear discharge, ear pain, hearing loss, nosebleeds and sore throat.   Eyes: Negative.  Negative for blurred vision and pain.  Respiratory:  Negative.  Negative for cough, hemoptysis, shortness of breath and wheezing.   Cardiovascular: Negative.  Negative for chest pain, palpitations and leg swelling.  Gastrointestinal: Negative.  Negative for abdominal pain, blood in stool, diarrhea, nausea and vomiting.  Genitourinary: Negative.  Negative for dysuria.  Musculoskeletal: Negative.  Negative for back pain.  Skin: Negative.   Neurological: Negative for dizziness, tremors, speech change, focal weakness, seizures and headaches.  Endo/Heme/Allergies: Negative.  Does not bruise/bleed easily.  Psychiatric/Behavioral: Negative.  Negative for depression, hallucinations and suicidal ideas.     PHYSICAL EXAMINATION:  GENERAL:  83 y.o.-year-old patient lying in the bed with no acute distress.  NECK:  Supple, no jugular venous distention. No thyroid enlargement, no tenderness.  LUNGS: Normal breath sounds bilaterally, no wheezing, rales,rhonchi  No use of accessory muscles of respiration.  CARDIOVASCULAR: S1, S2 normal. No murmurs, rubs, or gallops.  ABDOMEN: Soft, non-tender, non-distended. Bowel sounds present. No organomegaly or mass.  EXTREMITIES: No pedal edema, cyanosis, or clubbing.  PSYCHIATRIC: The patient is alert and oriented x 3.  SKIN: No obvious rash, lesion, or ulcer.   DATA REVIEW:   CBC Recent Labs  Lab 01/11/17 0458  WBC 6.0  HGB 11.8*  HCT 34.5*  PLT 209    Chemistries  Recent Labs  Lab 01/11/17 0458  NA 137  K 3.8  CL 107  CO2 25  GLUCOSE 106*  BUN 18  CREATININE 0.74  CALCIUM 8.0*  AST 26  ALT 23  ALKPHOS 49  BILITOT 0.8    Cardiac Enzymes Recent Labs  Lab 01/10/17 1550 01/10/17 2117 01/11/17 0458  TROPONINI <0.03 0.03* 0.03*    Microbiology Results  @MICRORSLT48 @  RADIOLOGY:  Ct Head Wo Contrast  Result Date: 01/10/2017 CLINICAL DATA:  Dizziness. EXAM: CT HEAD WITHOUT CONTRAST TECHNIQUE: Contiguous axial images were obtained from the base of the skull through the vertex without  intravenous contrast. COMPARISON:  None. FINDINGS: Brain: No evidence of acute infarction, hemorrhage, hydrocephalus, extra-axial collection or mass lesion/mass effect. Mild-to-moderate age-related cerebral atrophy with compensatory dilatation of the ventricles. Moderate periventricular white matter and corona radiata hypodensities favor chronic ischemic microvascular white matter disease. Vascular: Atherosclerotic vascular calcification of the carotid siphons. No hyperdense vessel. Skull: Negative for fracture or focal lesion. Sinuses/Orbits: No acute finding. Trace mucosal thickening in the right sphenoid sinus. Other: None. IMPRESSION: 1. No acute intracranial abnormality. Mild-to-moderate cerebral atrophy and chronic microvascular ischemic white matter disease. Electronically Signed   By: Obie DredgeWilliam T Derry M.D.   On: 01/10/2017 13:58      Allergies as of 01/11/2017   No Known Allergies     Medication List    STOP taking these medications   ciprofloxacin 500 MG tablet Commonly known as:  CIPRO   conjugated estrogens vaginal cream Commonly known as:  PREMARIN  TAKE these medications   alendronate 70 MG tablet Commonly known as:  FOSAMAX Take 70 mg by mouth once a week. Take with a full glass of water on an empty stomach.   amLODipine 10 MG tablet Commonly known as:  NORVASC Take 0.5 tablets (5 mg total) by mouth daily. What changed:  medication strength   aspirin 81 MG tablet Take 81 mg by mouth daily.   atorvastatin 10 MG tablet Commonly known as:  LIPITOR Take 10 mg by mouth.   citalopram 40 MG tablet Commonly known as:  CELEXA Take 40 mg by mouth daily.   lidocaine 5 % Commonly known as:  LIDODERM Place 1 patch onto the skin daily. Apply 1 patch and remove after 12 hours   losartan 100 MG tablet Commonly known as:  COZAAR Take 100 mg by mouth daily.   meloxicam 15 MG tablet Commonly known as:  MOBIC Take 15 mg by mouth daily.   potassium chloride 10 MEQ  tablet Commonly known as:  K-DUR Take 10 mEq by mouth daily.   pregabalin 50 MG capsule Commonly known as:  LYRICA Take 50 mg by mouth 3 (three) times daily.   venlafaxine XR 150 MG 24 hr capsule Commonly known as:  EFFEXOR-XR venlafaxine ER 37.5 mg capsule,extended release 24 hr  Start 1 tablet po q day for 1 week then 2 tablets po q day x1 week and continue titrating to 150 mg po q day         Management plans discussed with the patient and she is in agreement. Stable for discharge home  Patient should follow up with cardiology  CODE STATUS:     Code Status Orders  (From admission, onward)        Start     Ordered   01/10/17 1536  Full code  Continuous     01/10/17 1535    Code Status History    Date Active Date Inactive Code Status Order ID Comments User Context   This patient has a current code status but no historical code status.      TOTAL TIME TAKING CARE OF THIS PATIENT:  38 minutes.    Note: This dictation was prepared with Dragon dictation along with smaller phrase technology. Any transcriptional errors that result from this process are unintentional.  Birt Reinoso M.D on 01/11/2017 at 10:36 AM  Between 7am to 6pm - Pager - [phone] After 6pm go to [email or web address] - Social research officer, government  [facility]  Office  [phone]  CC: Primary care physician; [name], MD

## 2017-01-11 NOTE — Care Management Note (Signed)
Case Management Note  Patient Details  Name: Deborah CatalanBetty G Remsen MRN: [id number] Date of Birth: 16-Sep-1927  Subjective/Objective:      Discussed discharge planning with Mrs Shea EvansDunn. Mrs Shea EvansDunn refused offer of home health services after explanation of home health.               Action/Plan:   Expected Discharge Date:  01/11/17               Expected Discharge Plan:     In-House Referral:     Discharge planning Services     Post Acute Care Choice:    Choice offered to:     DME Arranged:    DME Agency:     HH Arranged:    HH Agency:     Status of Service:     If discussed at [employer] Meetings, dates discussed:    Additional Comments:  Teia Freitas A, RN 01/11/2017, 10:42 AM

## 2017-01-16 ENCOUNTER — Ambulatory Visit: Payer: Medicare Other | Admitting: Family Medicine

## 2017-01-22 ENCOUNTER — Ambulatory Visit (INDEPENDENT_AMBULATORY_CARE_PROVIDER_SITE_OTHER): Payer: Medicare Other | Admitting: Obstetrics and Gynecology

## 2017-01-22 ENCOUNTER — Encounter: Payer: Self-pay | Admitting: Obstetrics and Gynecology

## 2017-01-22 VITALS — BP 95/65 | HR 97 | Ht 66.0 in | Wt 141.1 lb

## 2017-01-22 DIAGNOSIS — Z4689 Encounter for fitting and adjustment of other specified devices: Secondary | ICD-10-CM

## 2017-01-22 DIAGNOSIS — N8111 Cystocele, midline: Secondary | ICD-10-CM

## 2017-01-22 DIAGNOSIS — K5900 Constipation, unspecified: Secondary | ICD-10-CM | POA: Diagnosis not present

## 2017-01-22 DIAGNOSIS — R339 Retention of urine, unspecified: Secondary | ICD-10-CM

## 2017-01-22 DIAGNOSIS — R300 Dysuria: Secondary | ICD-10-CM | POA: Diagnosis not present

## 2017-01-22 LAB — POCT URINALYSIS DIPSTICK
BILIRUBIN UA: NEGATIVE
Glucose, UA: NEGATIVE
Ketones, UA: NEGATIVE
Nitrite, UA: NEGATIVE
ODOR: NEGATIVE
Protein, UA: NEGATIVE
RBC UA: NEGATIVE
Spec Grav, UA: 1.015 (ref 1.010–1.025)
Urobilinogen, UA: 0.2 E.U./dL
pH, UA: 6.5 (ref 5.0–8.0)

## 2017-01-22 NOTE — Patient Instructions (Signed)
1.  Increase water intake-6 glasses/day 2.  Take Colace 100 mg twice a day for stool softener 3.  Recommend MiraLAX for chronic constipation 4.  Return in 4 months for pessary maintenance

## 2017-01-22 NOTE — Progress Notes (Signed)
Chief complaint: 1.  Pessary maintenance 2.  Cystocele with incomplete bladder emptying 3.  Rectocele  839-month pessary check Ring with support pessary Premarin cream intravaginal weekly Patient reports no significant vaginal discharge or bleeding.  She reports no vaginal odor.  She has noted some stinging within the vagina. Bowel function is notable for infrequent bowel movements every other day to every 3 days. Patient had a bad case of shingles over the holidays and was delayed in returning for follow-up; her previous scheduled follow-up was 2 months ago. [place] [facility] [place]  Past medical history, past surgical history, problem list, medications, and allergies are reviewed  OBJECTIVE: BP 95/65   Pulse 97   Ht 5\' 6"  (1.676 m)   Wt 141 lb 1.6 oz (64 kg)   BMI 22.77 kg/m  Pleasant well-appearing female in no acute distress.  She is alert and oriented. Abdomen: Soft, nontender without organomegaly Pelvic exam: External genitalia-normal BUS-normal Vagina-fair estrogen effect; no significant discharge; no area of hyperemia or ulceration; there is significant firm stool in the rectal vault that is palpated through the vagina-causing some pelvic discomfort  PROCEDURE: The ring with support diaphragm pessary is removed cleaned and reinserted  ASSESSMENT: 1.  Pessary maintenance 2.  Cystocele with incomplete bladder emptying, improved with pessary use 3.  Rectocele 4.  Chronic constipation, prominent today  PLAN: 1.  Continue Premarin cream intravaginal weekly 2.  Return in 4 months for pessary maintenance 3.  Increase water intake-6 glasses a day 4.  Add stool softener in the form of Colace 100 mg twice a day 5.  Use MiraLAX as needed for constipation  A total of 15 minutes were spent face-to-face with the patient during this encounter and over half of that time dealt with counseling and coordination of care.  Herold HarmsMartin A  Gatlin Kittell, MD  Note: This dictation was prepared with Dragon dictation along with smaller phrase technology. Any transcriptional errors that result from this process are unintentional.

## 2017-01-23 LAB — URINE CULTURE: Organism ID, Bacteria: NO GROWTH

## 2017-01-31 ENCOUNTER — Other Ambulatory Visit: Payer: Self-pay

## 2017-01-31 ENCOUNTER — Inpatient Hospital Stay
Admission: EM | Admit: 2017-01-31 | Discharge: 2017-02-03 | DRG: 312 | Disposition: A | Discharge status: Home / Self Care | Payer: Medicare Other | Attending: Internal Medicine | Admitting: Internal Medicine

## 2017-01-31 ENCOUNTER — Emergency Department: Payer: Medicare Other

## 2017-01-31 DIAGNOSIS — Z87891 Personal history of nicotine dependence: Secondary | ICD-10-CM

## 2017-01-31 DIAGNOSIS — I951 Orthostatic hypotension: Principal | ICD-10-CM | POA: Diagnosis present

## 2017-01-31 DIAGNOSIS — F329 Major depressive disorder, single episode, unspecified: Secondary | ICD-10-CM | POA: Diagnosis present

## 2017-01-31 DIAGNOSIS — Z7982 Long term (current) use of aspirin: Secondary | ICD-10-CM

## 2017-01-31 DIAGNOSIS — M623 Immobility syndrome (paraplegic): Secondary | ICD-10-CM | POA: Diagnosis present

## 2017-01-31 DIAGNOSIS — N390 Urinary tract infection, site not specified: Secondary | ICD-10-CM | POA: Diagnosis present

## 2017-01-31 DIAGNOSIS — R42 Dizziness and giddiness: Secondary | ICD-10-CM

## 2017-01-31 DIAGNOSIS — R270 Ataxia, unspecified: Secondary | ICD-10-CM

## 2017-01-31 DIAGNOSIS — Z79899 Other long term (current) drug therapy: Secondary | ICD-10-CM

## 2017-01-31 DIAGNOSIS — I4891 Unspecified atrial fibrillation: Secondary | ICD-10-CM | POA: Diagnosis present

## 2017-01-31 DIAGNOSIS — Z23 Encounter for immunization: Secondary | ICD-10-CM

## 2017-01-31 DIAGNOSIS — F419 Anxiety disorder, unspecified: Secondary | ICD-10-CM | POA: Diagnosis present

## 2017-01-31 DIAGNOSIS — I10 Essential (primary) hypertension: Secondary | ICD-10-CM | POA: Diagnosis present

## 2017-01-31 DIAGNOSIS — E785 Hyperlipidemia, unspecified: Secondary | ICD-10-CM | POA: Diagnosis present

## 2017-01-31 DIAGNOSIS — I639 Cerebral infarction, unspecified: Secondary | ICD-10-CM

## 2017-01-31 LAB — CBC
HCT: 40.1 % (ref 35.0–47.0)
HEMOGLOBIN: 13.6 g/dL (ref 12.0–16.0)
MCH: 31.4 pg (ref 26.0–34.0)
MCHC: 34 g/dL (ref 32.0–36.0)
MCV: 92.4 fL (ref 80.0–100.0)
PLATELETS: 232 10*3/uL (ref 150–440)
RBC: 4.34 MIL/uL (ref 3.80–5.20)
RDW: 12.8 % (ref 11.5–14.5)
WBC: 7 10*3/uL (ref 3.6–11.0)

## 2017-01-31 LAB — LIPID PANEL
CHOLESTEROL: 186 mg/dL (ref 0–200)
HDL: 76 mg/dL (ref >40)
LDL Cholesterol: 88 mg/dL (ref 0–99)
Total CHOL/HDL Ratio: 2.4 RATIO
Triglycerides: 108 mg/dL (ref <150)
VLDL: 22 mg/dL (ref 0–40)

## 2017-01-31 LAB — BASIC METABOLIC PANEL
ANION GAP: 10 (ref 5–15)
BUN: 20 mg/dL (ref 6–20)
CALCIUM: 9 mg/dL (ref 8.9–10.3)
CO2: 25 mmol/L (ref 22–32)
Chloride: 101 mmol/L (ref 101–111)
Creatinine, Ser: 0.87 mg/dL (ref 0.44–1.00)
GFR, EST NON AFRICAN AMERICAN: 58 mL/min — AB (ref >60)
GLUCOSE: 136 mg/dL — AB (ref 65–99)
Potassium: 4.1 mmol/L (ref 3.5–5.1)
Sodium: 136 mmol/L (ref 135–145)

## 2017-01-31 LAB — URINALYSIS, COMPLETE (UACMP) WITH MICROSCOPIC
Bilirubin Urine: NEGATIVE
GLUCOSE, UA: NEGATIVE mg/dL
HGB URINE DIPSTICK: NEGATIVE
Ketones, ur: NEGATIVE mg/dL
NITRITE: NEGATIVE
PH: 6 (ref 5.0–8.0)
PROTEIN: NEGATIVE mg/dL
SPECIFIC GRAVITY, URINE: 1.014 (ref 1.005–1.030)

## 2017-01-31 LAB — HEMOGLOBIN A1C
HEMOGLOBIN A1C: 5.3 % (ref 4.8–5.6)
MEAN PLASMA GLUCOSE: 105.41 mg/dL

## 2017-01-31 LAB — TROPONIN I: Troponin I: <0.03 ng/mL (ref <0.03)

## 2017-01-31 MED ORDER — PNEUMOCOCCAL VAC POLYVALENT 25 MCG/0.5ML IJ INJ
0.5000 mL | INJECTION | INTRAMUSCULAR | Status: AC
  Administered 2017-02-03: 0.5 mL via INTRAMUSCULAR
  Filled 2017-01-31: qty 0.5

## 2017-01-31 MED ORDER — CITALOPRAM HYDROBROMIDE 20 MG PO TABS
40.0000 mg | ORAL_TABLET | Freq: Every day | ORAL | Status: DC
  Administered 2017-02-01 – 2017-02-03 (×3): 40 mg via ORAL
  Filled 2017-01-31 (×3): qty 2

## 2017-01-31 MED ORDER — INFLUENZA VAC SPLIT HIGH-DOSE 0.5 ML IM SUSY
0.5000 mL | PREFILLED_SYRINGE | INTRAMUSCULAR | Status: AC
  Administered 2017-02-03: 0.5 mL via INTRAMUSCULAR
  Filled 2017-01-31 (×2): qty 0.5

## 2017-01-31 MED ORDER — VALACYCLOVIR HCL 500 MG PO TABS
1000.0000 mg | ORAL_TABLET | Freq: Every day | ORAL | Status: DC | PRN
  Filled 2017-01-31: qty 2

## 2017-01-31 MED ORDER — DOCUSATE SODIUM 100 MG PO CAPS: 100.0000 mg | ORAL_CAPSULE | Freq: Two times a day (BID) | ORAL | Status: DC | PRN

## 2017-01-31 MED ORDER — LOSARTAN POTASSIUM 50 MG PO TABS
100.0000 mg | ORAL_TABLET | Freq: Every day | ORAL | Status: DC
  Administered 2017-02-01: 100 mg via ORAL
  Filled 2017-01-31: qty 2

## 2017-01-31 MED ORDER — HEPARIN SODIUM (PORCINE) 5000 UNIT/ML IJ SOLN
5000.0000 [IU] | Freq: Three times a day (TID) | INTRAMUSCULAR | Status: DC
  Administered 2017-01-31 – 2017-02-03 (×7): 5000 [IU] via SUBCUTANEOUS
  Filled 2017-01-31 (×7): qty 1

## 2017-01-31 MED ORDER — ASPIRIN 81 MG PO CHEW
324.0000 mg | CHEWABLE_TABLET | Freq: Once | ORAL | Status: AC
  Administered 2017-01-31: 324 mg via ORAL
  Filled 2017-01-31: qty 4

## 2017-01-31 MED ORDER — LEVOTHYROXINE SODIUM 50 MCG PO TABS
50.0000 ug | ORAL_TABLET | ORAL | Status: DC
Start: 2017-02-01 — End: 2017-02-03
  Administered 2017-02-02 – 2017-02-03 (×2): 50 ug via ORAL
  Filled 2017-01-31 (×2): qty 1

## 2017-01-31 MED ORDER — ALENDRONATE SODIUM 70 MG PO TABS: 70.0000 mg | ORAL_TABLET | ORAL | Status: DC

## 2017-01-31 MED ORDER — PREGABALIN 50 MG PO CAPS
50.0000 mg | ORAL_CAPSULE | Freq: Three times a day (TID) | ORAL | Status: DC
  Administered 2017-01-31 – 2017-02-03 (×6): 50 mg via ORAL
  Filled 2017-01-31 (×6): qty 1

## 2017-01-31 MED ORDER — MELOXICAM 15 MG PO TABS
15.0000 mg | ORAL_TABLET | Freq: Every day | ORAL | Status: DC
  Administered 2017-02-01 – 2017-02-03 (×3): 15 mg via ORAL
  Filled 2017-01-31 (×3): qty 1

## 2017-01-31 MED ORDER — ATORVASTATIN CALCIUM 10 MG PO TABS
10.0000 mg | ORAL_TABLET | Freq: Every day | ORAL | Status: DC
Start: 2017-01-31 — End: 2017-02-03
  Administered 2017-02-01 – 2017-02-02 (×2): 10 mg via ORAL
  Filled 2017-01-31 (×2): qty 1

## 2017-01-31 MED ORDER — CEFTRIAXONE SODIUM IN DEXTROSE 20 MG/ML IV SOLN
1.0000 g | INTRAVENOUS | Status: DC
  Filled 2017-01-31 (×2): qty 50

## 2017-01-31 MED ORDER — ASPIRIN EC 81 MG PO TBEC
81.0000 mg | DELAYED_RELEASE_TABLET | Freq: Every day | ORAL | Status: DC
  Administered 2017-02-01 – 2017-02-03 (×3): 81 mg via ORAL
  Filled 2017-01-31 (×3): qty 1

## 2017-01-31 MED ORDER — AMLODIPINE BESYLATE 5 MG PO TABS
5.0000 mg | ORAL_TABLET | Freq: Every day | ORAL | Status: DC
  Administered 2017-02-01: 5 mg via ORAL
  Filled 2017-01-31: qty 1

## 2017-01-31 NOTE — Progress Notes (Signed)
MD paged to notify of pt BP of 172/95 lying and 158/104 sitting pt states already took daily doses of home BP medication. MD ordered to take another dose of regular medications for tonight and cont to monitor

## 2017-01-31 NOTE — H&P (Signed)
[facility] - [facility]   PATIENT NAME: Deborah Martinez    MR#:  [id number]  DATE OF BIRTH:  07/22/1927  DATE OF ADMISSION:  01/31/2017  PRIMARY CARE PHYSICIAN: [name], MD   REQUESTING/REFERRING PHYSICIAN: Chrissie Noa  CHIEF COMPLAINT:   Chief Complaint  Patient presents with  . Dizziness    HISTORY OF PRESENT ILLNESS: Deborah Martinez  is a 84 y.o. female with a known history of Anxiety, Htn, HLD, Thyroid disease, had admission for chest pain 2 weeks ago and plan is to do stress test Monday as out pt.  Felt very dizzi today on trial of standing up repeatedly, so came to ER> CT head negative , but given for admission for persistent symptoms.  PAST MEDICAL HISTORY:   Past Medical History:  Diagnosis Date  . Anxiety   . Constipation   . Cystocele   . Depression   . Hemorrhoid   . Hypercholesterolemia   . Hypertension   . Incomplete bladder emptying   . Postmenopausal atrophic vaginitis   . Rectocele   . Thyroid disease   . UTI (lower urinary tract infection)   . Vaginal enterocele     PAST SURGICAL HISTORY:  Past Surgical History:  Procedure Laterality Date  . APPENDECTOMY    . EYE SURGERY  2012   remve cataract  . TOTAL ABDOMINAL HYSTERECTOMY  1977    SOCIAL HISTORY:  Social History   Tobacco Use  . Smoking status: Former Games developer  . Smokeless tobacco: Never Used  Substance Use Topics  . Alcohol use: No    FAMILY HISTORY:  Family History  Problem Relation Age of Onset  . Heart attack Father   . Diabetes Brother   . Cancer Neg Hx     DRUG ALLERGIES: No Known Allergies  REVIEW OF SYSTEMS:   CONSTITUTIONAL: No fever, fatigue or weakness.  EYES: No blurred or double vision.  EARS, NOSE, AND THROAT: No tinnitus or ear pain.  RESPIRATORY: No cough, shortness of breath, wheezing or hemoptysis.  CARDIOVASCULAR: No chest pain, orthopnea, edema.  GASTROINTESTINAL: No nausea, vomiting, diarrhea or abdominal pain.  GENITOURINARY: No  dysuria, hematuria.  ENDOCRINE: No polyuria, nocturia,  HEMATOLOGY: No anemia, easy bruising or bleeding SKIN: No rash or lesion. MUSCULOSKELETAL: No joint pain or arthritis.   NEUROLOGIC: No tingling, numbness, weakness. Dizzy. PSYCHIATRY: No anxiety or depression.   MEDICATIONS AT HOME:  Prior to Admission medications   Medication Sig Start Date End Date Taking? Authorizing Provider  amLODipine (NORVASC) 10 MG tablet Take 0.5 tablets (5 mg total) by mouth daily. 01/11/17  Yes Adrian Saran, MD  aspirin 81 MG tablet Take 81 mg by mouth daily.   Yes [provider]  atorvastatin (LIPITOR) 10 MG tablet Take 10 mg by mouth. 11/25/11  Yes [provider]  citalopram (CELEXA) 40 MG tablet Take 40 mg by mouth daily.   Yes [provider]  levothyroxine (SYNTHROID, LEVOTHROID) 50 MCG tablet Take 50 mcg by mouth every morning.    Yes [provider]  LORazepam (ATIVAN) 0.5 MG tablet Take 1 tablet by mouth as needed   Yes [provider]  losartan (COZAAR) 100 MG tablet Take 100 mg by mouth daily.   Yes [provider]  meloxicam (MOBIC) 15 MG tablet Take 15 mg by mouth daily.   Yes [provider]  potassium chloride (K-DUR) 10 MEQ tablet Take 10 mEq by mouth daily.    Yes [provider]  pregabalin (  LYRICA) 50 MG capsule Take 50 mg by mouth 3 (three) times daily.   Yes [provider]  valACYclovir (VALTREX) 1000 MG tablet Take 1 tablet by mouth daily as directed   Yes [provider]  venlafaxine XR (EFFEXOR-XR) 150 MG 24 hr capsule venlafaxine ER 37.5 mg capsule,extended release 24 hr  Start 1 tablet po q day for 1 week then 2 tablets po q day x1 week and continue titrating to 150 mg po q day   Yes [provider]  alendronate (FOSAMAX) 70 MG tablet Take 70 mg by mouth once a week. Take with a full glass of water on an empty stomach.    [provider]      PHYSICAL EXAMINATION:    VITAL SIGNS: Blood pressure (!) 151/96, pulse 81, temperature 98 F (36.7 C), temperature source Oral, resp. rate 18, height 5\' 6"  (1.676 m), weight 64 kg (141 lb), SpO2 98 %.  GENERAL:  84 y.o.-year-old patient lying in the bed with no acute distress.  EYES: Pupils equal, round, reactive to light and accommodation. No scleral icterus. Extraocular muscles intact.  HEENT: Head atraumatic, normocephalic. Oropharynx and nasopharynx clear.  NECK:  Supple, no jugular venous distention. No thyroid enlargement, no tenderness.  LUNGS: Normal breath sounds bilaterally, no wheezing, rales,rhonchi or crepitation. No use of accessory muscles of respiration.  CARDIOVASCULAR: S1, S2 normal. No murmurs, rubs, or gallops.  ABDOMEN: Soft, nontender, nondistended. Bowel sounds present. No organomegaly or mass.  EXTREMITIES: No pedal edema, cyanosis, or clubbing.  NEUROLOGIC: Cranial nerves II through XII are intact. Muscle strength 4-5/5 in all extremities. Sensation intact. Gait not checked.  PSYCHIATRIC: The patient is alert and oriented x 3.  SKIN: No obvious rash, lesion, or ulcer.   LABORATORY PANEL:   CBC Recent Labs  Lab 01/31/17 1551  WBC 7.0  HGB 13.6  HCT 40.1  PLT 232  MCV 92.4  MCH 31.4  MCHC 34.0  RDW 12.8   ------------------------------------------------------------------------------------------------------------------  Chemistries  Recent Labs  Lab 01/31/17 1551  NA 136  K 4.1  CL 101  CO2 25  GLUCOSE 136*  BUN 20  CREATININE 0.87  CALCIUM 9.0   ------------------------------------------------------------------------------------------------------------------ estimated creatinine clearance is 42.6 mL/min (by C-G formula based on SCr of 0.87 mg/dL). ------------------------------------------------------------------------------------------------------------------ No results for input(s): TSH, T4TOTAL, T3FREE, THYROIDAB in the last 72 hours.  Invalid input(s):  FREET3   Coagulation profile No results for input(s): INR, PROTIME in the last 168 hours. ------------------------------------------------------------------------------------------------------------------- No results for input(s): DDIMER in the last 72 hours. -------------------------------------------------------------------------------------------------------------------  Cardiac Enzymes Recent Labs  Lab 01/31/17 1551  TROPONINI <0.03   ------------------------------------------------------------------------------------------------------------------ Invalid input(s): POCBNP  ---------------------------------------------------------------------------------------------------------------  Urinalysis    Component Value Date/Time   COLORURINE YELLOW (A) 01/10/2017 0719   APPEARANCEUR HAZY (A) 01/10/2017 0719   LABSPEC 1.009 01/10/2017 0719   PHURINE 7.0 01/10/2017 0719   GLUCOSEU NEGATIVE 01/10/2017 0719   HGBUR NEGATIVE 01/10/2017 0719   BILIRUBINUR neg 01/22/2017 1445   KETONESUR NEGATIVE 01/10/2017 0719   PROTEINUR neg 01/22/2017 1445   PROTEINUR NEGATIVE 01/10/2017 0719   UROBILINOGEN 0.2 01/22/2017 1445   NITRITE neg 01/22/2017 1445   NITRITE NEGATIVE 01/10/2017 0719   LEUKOCYTESUR Moderate (2+) (A) 01/22/2017 1445     RADIOLOGY: Ct Head Wo Contrast  Result Date: 01/31/2017 CLINICAL DATA:  Ataxia with stroke suspected. EXAM: CT HEAD WITHOUT CONTRAST TECHNIQUE: Contiguous axial images were obtained from the base of the skull through the vertex without intravenous contrast. COMPARISON:  01/10/2017 FINDINGS:  Brain: Patchy low-density in the bilateral cerebral white matter and deep gray nuclei appears stable from prior and is attributed to chronic ischemia. Low-density in the central right thalamus is better seen than on prior but was likely present previously. Age congruent cerebral volume loss. No hemorrhage, hydrocephalus, or masslike finding. Vascular: Atherosclerotic  calcification.  No hyperdense vessel. Skull: Negative Sinuses/Orbits: Negative IMPRESSION: 1. No acute finding when compared to prior. 2. Chronic small vessel ischemia. Electronically Signed   By: Marnee SpringJonathon  Watts M.D.   On: 01/31/2017 18:09    EKG: Orders placed or performed during the hospital encounter of 01/31/17  . EKG 12-Lead  . EKG 12-Lead  . ED EKG  . ED EKG    IMPRESSION AND PLAN:  * Dizziness   Likely TIA or cardiac origin   Monitor on tele.   MRI brain, Echo and carotid doppler.   Neuro checks   PT , OT eval   Lipid and HBA1c.  * Htn   Cont home meds.  * Hyperlipidemia   Cont atorvastatin.  * recent admission for chest pain   Plan for stress test Monday, Cont as planned.  All the records are reviewed and case discussed with ED provider. Management plans discussed with the patient, family and they are in agreement.  CODE STATUS: Full. Code Status History    Date Active Date Inactive Code Status Order ID Comments User Context   01/10/2017 15:35 01/11/2017 16:49 Full Code [id number]  Marguarite ArbourSparks, Jeffrey D, MD Inpatient     Husband in room.  TOTAL TIME TAKING CARE OF THIS PATIENT: 45 minutes.    Altamese DillingVaibhavkumar Axcel Horsch M.D on 01/31/2017   Between 7am to 6pm - Pager - [phone]  After 6pm go to [email or web address] - password [email or web address]  [facility]  Office  [phone]  CC: Primary care physician; Jerl MinaHedrick, James, MD   Note: This dictation was prepared with Dragon dictation along with smaller phrase technology. Any transcriptional errors that result from this process are unintentional.

## 2017-01-31 NOTE — Progress Notes (Signed)

## 2017-01-31 NOTE — ED Provider Notes (Signed)
[facility] Emergency Department Provider Note       Time seen: ----------------------------------------- 4:58 PM on 01/31/2017 -----------------------------------------   I have reviewed the triage vital signs and the nursing notes.  HISTORY   Chief Complaint Dizziness    HPI Deborah Martinez is a 84 y.o. female with a history of anxiety, depression, hypertension, hyperlipidemia, UTI who presents to the ED for dizziness.  Patient arrives via EMS complaining of dizziness starting today.  She denies any pain, reports is worse with standing.  She does have a history of atrial fibrillation but vital signs were stable on arrival.  She denies any recent illness or other complaints.  Patient states she cannot walk without being significantly off balance.  3 days ago she went for a walk normally.  She was admitted in the hospital recently for dizziness without any specific etiology discovered.  Past Medical History:  Diagnosis Date  . Anxiety   . Constipation   . Cystocele   . Depression   . Hemorrhoid   . Hypercholesterolemia   . Hypertension   . Incomplete bladder emptying   . Postmenopausal atrophic vaginitis   . Rectocele   . Thyroid disease   . UTI (lower urinary tract infection)   . Vaginal enterocele     Patient Active Problem List   Diagnosis Date Noted  . Chest pain 01/10/2017  . Dizziness 01/10/2017  . Ataxia 01/10/2017  . Cystocele, midline 07/27/2014  . Menopause 07/27/2014  . Vaginal atrophy 07/27/2014  . Incomplete bladder emptying 07/27/2014  . Anxiety and depression 07/27/2014  . Constipation 07/27/2014  . Hypertension 07/27/2014  . Hypercholesterolemia 07/27/2014  . Hemorrhoids 07/27/2014  . Rectocele 07/27/2014  . Vaginal enterocele 07/27/2014    Past Surgical History:  Procedure Laterality Date  . APPENDECTOMY    . EYE SURGERY  2012   remve cataract  . TOTAL ABDOMINAL HYSTERECTOMY  1977    Allergies Patient has no known  allergies.  Social History Social History   Tobacco Use  . Smoking status: Former Games developermoker  . Smokeless tobacco: Never Used  Substance Use Topics  . Alcohol use: No  . Drug use: No    Review of Systems Constitutional: Negative for fever. Cardiovascular: Negative for chest pain. Respiratory: Negative for shortness of breath. Gastrointestinal: Negative for abdominal pain, vomiting and diarrhea. Genitourinary: Negative for dysuria. Musculoskeletal: Negative for back pain. Skin: Negative for rash. Neurological: Negative for headaches, focal weakness or numbness.  Positive for dizziness  All systems negative/normal/unremarkable except as stated in the HPI  ____________________________________________   PHYSICAL EXAM:  VITAL SIGNS: ED Triage Vitals  Enc Vitals Group     BP 01/31/17 1556 (!) 130/92     Pulse Rate 01/31/17 1556 84     Resp 01/31/17 1556 16     Temp 01/31/17 1556 98 F (36.7 C)     Temp Source 01/31/17 1556 Oral     SpO2 01/31/17 1556 100 %     Weight 01/31/17 1557 141 lb (64 kg)     Height 01/31/17 1557 5\' 6"  (1.676 m)     Head Circumference --      Peak Flow --      Pain Score --      Pain Loc --      Pain Edu? --      Excl. in GC? --    Constitutional: Alert and oriented. Well appearing and in no distress. Eyes: Conjunctivae are normal. Normal extraocular movements. ENT  Head: Normocephalic and atraumatic.   Nose: No congestion/rhinnorhea.   Mouth/Throat: Mucous membranes are moist.   Neck: No stridor. Cardiovascular: Normal rate, regular rhythm. No murmurs, rubs, or gallops. Respiratory: Normal respiratory effort without tachypnea nor retractions. Breath sounds are clear and equal bilaterally. No wheezes/rales/rhonchi. Gastrointestinal: Soft and nontender. Normal bowel sounds Musculoskeletal: Nontender with normal range of motion in extremities. No lower extremity tenderness nor edema. Neurologic:  Normal speech and language. No gross  focal neurologic deficits are appreciated.  Strength, sensation, cranial nerves appear to be normal.  Patient is very off balance and has a positive Romberg Skin:  Skin is warm, dry and intact. No rash noted. Psychiatric: Mood and affect are normal. Speech and behavior are normal.  ____________________________________________  EKG: Interpreted by me.  Sinus rhythm with PACs, rate is 84 bpm, incomplete right bundle branch block, possible septal infarct age-indeterminate, normal QT.  ____________________________________________  ED COURSE:  As part of my medical decision making, I reviewed the following data within the electronic MEDICAL RECORD NUMBER History obtained from family if available, nursing notes, old chart and ekg, as well as notes from prior ED visits. Patient presented for dizziness and what appears to be ataxia, we will assess with labs and imaging as indicated at this time.  Patient was not able to safely ambulate at all.   Procedures ____________________________________________   LABS (pertinent positives/negatives)  Labs Reviewed  BASIC METABOLIC PANEL - Abnormal; Notable for the following components:      Result Value   Glucose, Bld 136 (*)    GFR calc non Af Amer 58 (*)    All other components within normal limits  CBC  TROPONIN I  URINALYSIS, COMPLETE (UACMP) WITH MICROSCOPIC  CBG MONITORING, ED    RADIOLOGY Images were viewed by me  CT head IMPRESSION: 1. No acute finding when compared to prior. 2. Chronic small vessel ischemia. ____________________________________________  DIFFERENTIAL DIAGNOSIS   CVA, hemorrhage, dehydration, orthostatic hypotension, electrolyte abnormality, medication side effect, occult infection  FINAL ASSESSMENT AND PLAN  Ataxia  Plan: Patient had presented for ataxia and remains very off balance. Patient's labs did not reveal any acute process. Patient's imaging was negative for CVA but I suspect she has had a CVA that is not  showing up yet.  She will need an MRI and further inpatient workup.  She also needs to work with PT because she is severely off balance.   Emily Filbert, MD   Note: This note was generated in part or whole with voice recognition software. Voice recognition is usually quite accurate but there are transcription errors that can and very often do occur. I apologize for any typographical errors that were not detected and corrected.     Emily Filbert, MD 01/31/17 [phone]

## 2017-01-31 NOTE — ED Triage Notes (Signed)
Pt came to ED via EMS c/o dizziness starting today. Denies pain. Reports worse when standing. History of afib. Vs stable at this time

## 2017-02-01 ENCOUNTER — Observation Stay: Payer: Medicare Other

## 2017-02-01 DIAGNOSIS — F329 Major depressive disorder, single episode, unspecified: Secondary | ICD-10-CM | POA: Diagnosis present

## 2017-02-01 DIAGNOSIS — R270 Ataxia, unspecified: Secondary | ICD-10-CM | POA: Diagnosis not present

## 2017-02-01 DIAGNOSIS — I10 Essential (primary) hypertension: Secondary | ICD-10-CM | POA: Diagnosis present

## 2017-02-01 DIAGNOSIS — Z87891 Personal history of nicotine dependence: Secondary | ICD-10-CM | POA: Diagnosis not present

## 2017-02-01 DIAGNOSIS — Z23 Encounter for immunization: Secondary | ICD-10-CM | POA: Diagnosis not present

## 2017-02-01 DIAGNOSIS — F419 Anxiety disorder, unspecified: Secondary | ICD-10-CM | POA: Diagnosis present

## 2017-02-01 DIAGNOSIS — Z79899 Other long term (current) drug therapy: Secondary | ICD-10-CM | POA: Diagnosis not present

## 2017-02-01 DIAGNOSIS — M623 Immobility syndrome (paraplegic): Secondary | ICD-10-CM | POA: Diagnosis present

## 2017-02-01 DIAGNOSIS — I4891 Unspecified atrial fibrillation: Secondary | ICD-10-CM | POA: Diagnosis present

## 2017-02-01 DIAGNOSIS — I951 Orthostatic hypotension: Secondary | ICD-10-CM | POA: Diagnosis present

## 2017-02-01 DIAGNOSIS — R079 Chest pain, unspecified: Secondary | ICD-10-CM | POA: Diagnosis not present

## 2017-02-01 DIAGNOSIS — N390 Urinary tract infection, site not specified: Secondary | ICD-10-CM | POA: Diagnosis present

## 2017-02-01 DIAGNOSIS — Z7982 Long term (current) use of aspirin: Secondary | ICD-10-CM | POA: Diagnosis not present

## 2017-02-01 DIAGNOSIS — E785 Hyperlipidemia, unspecified: Secondary | ICD-10-CM | POA: Diagnosis present

## 2017-02-01 LAB — BASIC METABOLIC PANEL
Anion gap: 8 (ref 5–15)
BUN: 19 mg/dL (ref 6–20)
CHLORIDE: 104 mmol/L (ref 101–111)
CO2: 26 mmol/L (ref 22–32)
Calcium: 8.5 mg/dL — ABNORMAL LOW (ref 8.9–10.3)
Creatinine, Ser: 0.68 mg/dL (ref 0.44–1.00)
GFR calc Af Amer: >60 mL/min (ref >60)
GFR calc non Af Amer: >60 mL/min (ref >60)
Glucose, Bld: 96 mg/dL (ref 65–99)
POTASSIUM: 3.6 mmol/L (ref 3.5–5.1)
SODIUM: 138 mmol/L (ref 135–145)

## 2017-02-01 LAB — CBC
HEMATOCRIT: 37.2 % (ref 35.0–47.0)
Hemoglobin: 13 g/dL (ref 12.0–16.0)
MCH: 31.9 pg (ref 26.0–34.0)
MCHC: 35 g/dL (ref 32.0–36.0)
MCV: 91.4 fL (ref 80.0–100.0)
Platelets: 233 10*3/uL (ref 150–440)
RBC: 4.08 MIL/uL (ref 3.80–5.20)
RDW: 12.7 % (ref 11.5–14.5)
WBC: 7 10*3/uL (ref 3.6–11.0)

## 2017-02-01 MED ORDER — AMLODIPINE BESYLATE 5 MG PO TABS
2.5000 mg | ORAL_TABLET | Freq: Every day | ORAL | Status: DC
  Administered 2017-02-02 – 2017-02-03 (×2): 2.5 mg via ORAL
  Filled 2017-02-01 (×2): qty 1

## 2017-02-01 MED ORDER — LOSARTAN POTASSIUM 25 MG PO TABS
25.0000 mg | ORAL_TABLET | Freq: Every day | ORAL | Status: DC
  Administered 2017-02-02 – 2017-02-03 (×2): 25 mg via ORAL
  Filled 2017-02-01 (×2): qty 1

## 2017-02-01 MED ORDER — AMLODIPINE BESYLATE 5 MG PO TABS
5.0000 mg | ORAL_TABLET | Freq: Once | ORAL | Status: AC
  Administered 2017-02-01: 5 mg via ORAL
  Filled 2017-02-01: qty 1

## 2017-02-01 MED ORDER — HYDRALAZINE HCL 20 MG/ML IJ SOLN: 10.0000 mg | INTRAMUSCULAR | Status: DC | PRN

## 2017-02-01 MED ORDER — MECLIZINE HCL 12.5 MG PO TABS
12.5000 mg | ORAL_TABLET | Freq: Three times a day (TID) | ORAL | Status: DC | PRN
  Administered 2017-02-01: 12.5 mg via ORAL
  Filled 2017-02-01 (×2): qty 1

## 2017-02-01 MED ORDER — LOSARTAN POTASSIUM 50 MG PO TABS
100.0000 mg | ORAL_TABLET | Freq: Once | ORAL | Status: AC
  Administered 2017-02-01: 100 mg via ORAL
  Filled 2017-02-01: qty 2

## 2017-02-01 MED ORDER — DOCUSATE SODIUM 100 MG PO CAPS: 200.0000 mg | ORAL_CAPSULE | Freq: Two times a day (BID) | ORAL | Status: DC | PRN

## 2017-02-01 MED ORDER — LACTULOSE 10 GM/15ML PO SOLN
30.0000 g | Freq: Once | ORAL | Status: AC
  Administered 2017-02-01: 30 g via ORAL
  Filled 2017-02-01: qty 60

## 2017-02-01 MED ORDER — DEXTROSE 5 % IV SOLN
1.0000 g | INTRAVENOUS | Status: DC
  Administered 2017-02-01 – 2017-02-03 (×3): 1 g via INTRAVENOUS
  Filled 2017-02-01 (×3): qty 10

## 2017-02-01 NOTE — Evaluation (Signed)
Physical Therapy Evaluation Patient Details Name: Deborah LenzBetty W Schall MRN: [id number] DOB: Jun 12, 1927 Today's Date: 02/01/2017   History of Present Illness  84 yo Female came to ED with dizziness; Patient was recently admitted to the hospital 2 weeks ago with chest pain; She was supposed to have a cardiac stress test on 02/02/17 as outpatient; Patient has PMH significatn for anxiety, HTN, HLD and thyroid disease; MRI was negative for acute findings;   Clinical Impression  84 yo Female came to ED with dizziness; patient has been having dizziness for last few weeks. She was supposed to have a cardiac stress test on 02/02/17; Patient denies any recent falls. She lives in an independent living facility with her husband. Patient reports being mod I for all ADLs. She reports that she has been more unsteady lately. Patient is currently mod I for bed mobility; She requires CGA for sit<>Stand transfers for steady; Patient ambulated in room approximately 20 feet without AD with min A for safety; She exhibits short step length with decreased hip flexion/decreased ankle DF at heel strike; Patient walks with slower gait speed; She didn't veer side/side but was slightly unsteady; She reports that she was walking today slower than she had previously at home. PT assessed vitals orthostatic BP: supine: 117/73, sitting: 94/63, standing: 88/65; Patient does report dizziness with position change; PT educated patient in arm pumps to help with BP with position change and also educated to move slowly; After sitting for short period of time, BP increased to 106/75; Patient would benefit from skilled PT intervention to improve strength, balance and gait safety. Recommend home health PT upon discharge; Patient agreeable;     Follow Up Recommendations Home health PT;Supervision for mobility/OOB    Equipment Recommendations  None recommended by PT    Recommendations for Other Services       Precautions / Restrictions  Precautions Precautions: Fall Restrictions Weight Bearing Restrictions: No      Mobility  Bed Mobility Overal bed mobility: Modified Independent             General bed mobility comments: No physical assist or cues needed.  does use bed rails;   Transfers Overall transfer level: Needs assistance Equipment used: None Transfers: Sit to/from Stand Sit to Stand: Min guard         General transfer comment: CGA for safety; patient reports increased dizziness upon standing; orthostatics taken: BP dropped from 117/73 in supine to 94/63 in sitting to 88/65 in standing; uses grab bar in bathroom for toilet transfer with CGA for safety x1 reps;   Ambulation/Gait Ambulation/Gait assistance: Min assist Ambulation Distance (Feet): 20 Feet Assistive device: None Gait Pattern/deviations: Step-through pattern;Decreased step length - right;Decreased step length - left;Decreased dorsiflexion - right;Decreased dorsiflexion - left;Narrow base of support;Shuffle;Trunk flexed Gait velocity: decreased   General Gait Details: slower gait speed; requires min A to steady; Patient often reaches out for furniture while walking in room; She reports walking at slower speed than her prior level of function;   Stairs            Wheelchair Mobility    Modified Rankin (Stroke Patients Only)       Balance Overall balance assessment: Needs assistance Sitting-balance support: No upper extremity supported;Feet supported Sitting balance-Leahy Scale: Good     Standing balance support: No upper extremity supported;During functional activity Standing balance-Leahy Scale: Poor Standing balance comment: static standing balance is fair; dynamic standing balance is poor;  Pertinent Vitals/Pain Pain Assessment: No/denies pain    Home Living Family/patient expects to be discharged to:: Private residence Living Arrangements: Spouse/significant  other Available Help at Discharge: Family;Available 24 hours/day Type of Home: Independent living facility Home Access: Level entry     Home Layout: One level Home Equipment: Cane - single point;Shower seat      Prior Function Level of Independence: Independent         Comments: Pt reports she was Ind PTA with all aspects of mobility.  Food is provided by facility.  Pt no longer drives.  did not use any assistive device;      Hand Dominance        Extremity/Trunk Assessment   Upper Extremity Assessment Upper Extremity Assessment: Overall WFL for tasks assessed    Lower Extremity Assessment Lower Extremity Assessment: Generalized weakness    Cervical / Trunk Assessment Cervical / Trunk Assessment: Kyphotic  Communication   Communication: No difficulties  Cognition Arousal/Alertness: Awake/alert Behavior During Therapy: WFL for tasks assessed/performed Overall Cognitive Status: Within Functional Limits for tasks assessed                         Following Commands: Follows multi-step commands consistently       General Comments: oriented to situation, person, place       General Comments      Exercises     Assessment/Plan    PT Assessment Patient needs continued PT services  PT Problem List Decreased strength;Decreased balance;Decreased safety awareness;Decreased knowledge of use of DME;Cardiopulmonary status limiting activity;Decreased mobility;Decreased activity tolerance       PT Treatment Interventions DME instruction;Gait training;Stair training;Functional mobility training;Therapeutic activities;Therapeutic exercise;Balance training;Neuromuscular re-education;Patient/family education    PT Goals (Current goals can be found in the Care Plan section)  Acute Rehab PT Goals Patient Stated Goal: "I want to go home."  PT Goal Formulation: With patient Time For Goal Achievement: 02/15/17 Potential to Achieve Goals: Good    Frequency Min  2X/week   Barriers to discharge   none    Co-evaluation               AM-PAC PT "6 Clicks" Daily Activity  Outcome Measure Difficulty turning over in bed (including adjusting bedclothes, sheets and blankets)?: None Difficulty moving from lying on back to sitting on the side of the bed? : None Difficulty sitting down on and standing up from a chair with arms (e.g., wheelchair, bedside commode, etc,.)?: A Little Help needed moving to and from a bed to chair (including a wheelchair)?: A Little Help needed walking in hospital room?: A Little Help needed climbing 3-5 steps with a railing? : A Little 6 Click Score: 20    End of Session Equipment Utilized During Treatment: Gait belt Activity Tolerance: Patient tolerated treatment well Patient left: in chair;with chair alarm set;with call bell/phone within reach;with family/visitor present Nurse Communication: Mobility status PT Visit Diagnosis: Unsteadiness on feet (R26.81);Other abnormalities of gait and mobility (R26.89)    Time: 0981-1914 PT Time Calculation (min) (ACUTE ONLY): 27 min   Charges:   PT Evaluation $PT Eval Low Complexity: 1 Low     PT G Codes:          Trotter,Margaret PT, DPT 02/01/2017, 2:10 PM

## 2017-02-01 NOTE — Progress Notes (Signed)
Pt positive orthostatics vitals. MD salary notified.

## 2017-02-01 NOTE — Progress Notes (Addendum)
[employer] [facility]   PATIENT NAME: Deborah Martinez    MR#:  [id number]  DATE OF BIRTH:  June 17, 1927  SUBJECTIVE:  CHIEF COMPLAINT:   Chief Complaint  Patient presents with  . Dizziness  Patient denies dizziness, no focal motor deficits noted, MRI negative, carotid Dopplers negative, per nursing staff patient requiring 2 person assist to stand-physical therapy to evaluate/treat, follow-up on urine cultures for possible UTI, complains of constipation will start Senokot at bedtime  REVIEW OF SYSTEMS:  CONSTITUTIONAL: No fever, fatigue or weakness.  EYES: No blurred or double vision.  EARS, NOSE, AND THROAT: No tinnitus or ear pain.  RESPIRATORY: No cough, shortness of breath, wheezing or hemoptysis.  CARDIOVASCULAR: No chest pain, orthopnea, edema.  GASTROINTESTINAL: No nausea, vomiting, diarrhea or abdominal pain.  GENITOURINARY: No dysuria, hematuria.  ENDOCRINE: No polyuria, nocturia,  HEMATOLOGY: No anemia, easy bruising or bleeding SKIN: No rash or lesion. MUSCULOSKELETAL: No joint pain or arthritis.   NEUROLOGIC: No tingling, numbness, weakness.  PSYCHIATRY: No anxiety or depression.   ROS  DRUG ALLERGIES:  No Known Allergies  VITALS:  Blood pressure (!) 148/86, pulse 75, temperature 97.8 F (36.6 C), temperature source Oral, resp. rate 18, height 5\' 6"  (1.676 m), weight 62.6 kg (138 lb 1.6 oz), SpO2 94 %.  PHYSICAL EXAMINATION:  GENERAL:  84 y.o.-year-old patient lying in the bed with no acute distress.  EYES: Pupils equal, round, reactive to light and accommodation. No scleral icterus. Extraocular muscles intact.  HEENT: Head atraumatic, normocephalic. Oropharynx and nasopharynx clear.  NECK:  Supple, no jugular venous distention. No thyroid enlargement, no tenderness.  LUNGS: Normal breath sounds bilaterally, no wheezing, rales,rhonchi or crepitation. No use of accessory muscles of respiration.  CARDIOVASCULAR: S1, S2 normal. No murmurs,  rubs, or gallops.  ABDOMEN: Soft, nontender, nondistended. Bowel sounds present. No organomegaly or mass.  EXTREMITIES: No pedal edema, cyanosis, or clubbing.  NEUROLOGIC: Cranial nerves II through XII are intact. Muscle strength 5/5 in all extremities. Sensation intact. Gait not checked.  PSYCHIATRIC: The patient is alert and oriented x 3.  SKIN: No obvious rash, lesion, or ulcer.   Physical Exam LABORATORY PANEL:   CBC Recent Labs  Lab 02/01/17 0346  WBC 7.0  HGB 13.0  HCT 37.2  PLT 233   ------------------------------------------------------------------------------------------------------------------  Chemistries  Recent Labs  Lab 02/01/17 0346  NA 138  K 3.6  CL 104  CO2 26  GLUCOSE 96  BUN 19  CREATININE 0.68  CALCIUM 8.5*   ------------------------------------------------------------------------------------------------------------------  Cardiac Enzymes Recent Labs  Lab 01/31/17 1551  TROPONINI <0.03   ------------------------------------------------------------------------------------------------------------------  RADIOLOGY:  Ct Head Wo Contrast  Result Date: 01/31/2017 CLINICAL DATA:  Ataxia with stroke suspected. EXAM: CT HEAD WITHOUT CONTRAST TECHNIQUE: Contiguous axial images were obtained from the base of the skull through the vertex without intravenous contrast. COMPARISON:  01/10/2017 FINDINGS: Brain: Patchy low-density in the bilateral cerebral white matter and deep gray nuclei appears stable from prior and is attributed to chronic ischemia. Low-density in the central right thalamus is better seen than on prior but was likely present previously. Age congruent cerebral volume loss. No hemorrhage, hydrocephalus, or masslike finding. Vascular: Atherosclerotic calcification.  No hyperdense vessel. Skull: Negative Sinuses/Orbits: Negative IMPRESSION: 1. No acute finding when compared to prior. 2. Chronic small vessel ischemia. Electronically Signed   By:  Marnee Spring M.D.   On: 01/31/2017 18:09   Mr Brain Wo Contrast  Result Date: 02/01/2017 CLINICAL DATA:  84 year old female with  ataxia, recent onset dizziness. A fib. EXAM: MRI HEAD WITHOUT CONTRAST TECHNIQUE: Multiplanar, multiecho pulse sequences of the brain and surrounding structures were obtained without intravenous contrast. COMPARISON:  Head CT without contrast 01/31/2017, and earlier. FINDINGS: Brain: No restricted diffusion to suggest acute infarction. No midline shift, mass effect, evidence of mass lesion, ventriculomegaly, extra-axial collection or acute intracranial hemorrhage. Cervicomedullary junction within normal limits. Cerebral volume is within normal limits for age. Partially empty sella. Widely scattered bilateral cerebral white matter T2 and FLAIR hyperintensity. The configuration is nonspecific, although there involvement of the anterior right deep white matter capsules. Mild for age T2 heterogeneity in the deep gray matter nuclei, which appears in part related to perivascular spaces. Moderate confluent T2 hyperintensity in the pons. No cortical encephalomalacia or chronic cerebral blood products. Vascular: Major intracranial vascular flow voids are preserved, with generalized intracranial artery tortuosity. Skull and upper cervical spine: Multilevel mild spondylolisthesis in the cervical spine with advanced disc degeneration, but no definite upper cervical spinal stenosis. Normal bone marrow signal. Sinuses/Orbits: Postoperative changes to the right globe. Otherwise normal orbits soft tissues. Minor paranasal sinus mucosal thickening. Other: Visible internal auditory structures appear normal. Mild chronic sclerosis of the mastoid air cells which otherwise are clear. Scalp and face soft tissues appear negative. IMPRESSION: 1.  No acute infarct or other intracranial abnormality identified. 2. Mild to moderate for age nonspecific signal changes in the brain, most commonly due to chronic  small vessel disease. Electronically Signed   By: Odessa Fleming M.D.   On: 02/01/2017 08:33   US Carotid Bilateral  Result Date: 02/01/2017 CLINICAL DATA:  Presented with dizziness. EXAM: BILATERAL CAROTID DUPLEX ULTRASOUND TECHNIQUE: Wallace Cullens scale imaging, color Doppler and duplex ultrasound were performed of bilateral carotid and vertebral arteries in the neck. COMPARISON:  MRI 02/01/2017 FINDINGS: Criteria: Quantification of carotid stenosis is based on velocity parameters that correlate the residual internal carotid diameter with NASCET-based stenosis levels, using the diameter of the distal internal carotid lumen as the denominator for stenosis measurement. The following velocity measurements were obtained: RIGHT ICA:  39 cm/sec CCA:  39 cm/sec SYSTOLIC ICA/CCA RATIO:  1.0 DIASTOLIC ICA/CCA RATIO:  1.3 ECA:  47 cm/sec LEFT ICA:  68 cm/sec CCA:  41 cm/sec SYSTOLIC ICA/CCA RATIO:  1.7 DIASTOLIC ICA/CCA RATIO:  1.8 ECA:  44 cm/sec RIGHT CAROTID ARTERY: Eccentric plaque in the right common carotid artery. Small amount of plaque at the right carotid bulb. External carotid artery is patent with normal waveform. Normal waveforms and velocities in the internal carotid artery. RIGHT VERTEBRAL ARTERY: Antegrade flow and normal waveform in the right vertebral artery. LEFT CAROTID ARTERY: Echogenic plaque at the left carotid bulb. Echogenic plaque at the origin of the external carotid artery. External carotid artery is patent with normal waveform. Normal waveforms and velocities in the internal carotid artery. LEFT VERTEBRAL ARTERY: Antegrade flow and normal waveform in the left vertebral artery. IMPRESSION: Mild atherosclerotic disease in the bilateral carotid arteries. Estimated degree of stenosis in the internal carotid arteries is less than 50% bilaterally. Patent vertebral arteries with antegrade flow. Electronically Signed   By: Richarda Overlie M.D.   On: 02/01/2017 10:57    ASSESSMENT AND PLAN:  84 y.o. female with a  known history of Anxiety, Htn, HLD, Thyroid disease, had admission for chest pain 2 weeks ago w/ plan for stress test Monday as out pt, presents w/ dizziness, workup unimpressive in the emergency room, patient subsequently admitted to observation for possible TIA   1 acute  dizziness/? TIA Resolved Suspect may be related to orthostasis Orthostatic vitals were positive-decrease Norvasc to 2.5 mg daily, decrease losartan to 25 mg daily, MRI negative for acute stroke, carotid Dopplers negative for hemodynamically significant stenosis, echocardiogram done last month noted for stage I diastolic dysfunction/normal ejection fraction, continue aspirin, statin therapy, physical therapy to evaluate/treat, meclizine as needed, gentle IV fluids for rehydration over the next 12 hours, repeat orthostatics in the morning, and continue close medical monitoring  2 acute orthostasis Stable Orthostatic vitals were positive Decrease Norvasc to 2.5 mg daily, decrease losartan to 25 mg daily, vitals per routine, check orthostatics in the morning, make changes as per necessary Patient may have to have further reduction in antihypertensives, may have to run blood pressure high to avoid recurrent dizziness, but will be at increased risk for strokes  3 acute probable urinary tract infection Continue empiric Rocephin for 3-day course, follow-up on urine cultures, change to admission status  4 acute immobility syndrome Currently requiring 2 person assist to help with standing per nursing staff Physical therapy to evaluate/treat  5 chronic benign essential hypertension Stable Continue home regiment, PRN hydralazine for systolic blood pressure greater than 160, vitals per routine, make changes as per necessary  6 chronic hyperlipidemia, unspecified Stable Continue statin therapy  7 history of recent admission for chest pain Noted plans for outpatient stress testing on Monday-most likely will need to be rescheduled  versus done while in house  All the records are reviewed and case discussed with Care Management/Social Workerr. Management plans discussed with the patient, family and they are in agreement.  CODE STATUS: full  TOTAL TIME TAKING CARE OF THIS PATIENT: 35 minutes.     POSSIBLE D/C IN 1-2 DAYS, DEPENDING ON CLINICAL CONDITION.   Evelena AsaMontell D Salary M.D on 02/01/2017   Between 7am to 6pm - Pager - [phone]  After 6pm go to [email or web address] - password [email or web address]  [facility]  Office  [phone]  CC: Primary care physician; Jerl MinaHedrick, James, MD  Note: This dictation was prepared with Dragon dictation along with smaller phrase technology. Any transcriptional errors that result from this process are unintentional.

## 2017-02-02 ENCOUNTER — Inpatient Hospital Stay (HOSPITAL_BASED_OUTPATIENT_CLINIC_OR_DEPARTMENT_OTHER): Payer: Medicare Other

## 2017-02-02 DIAGNOSIS — R270 Ataxia, unspecified: Secondary | ICD-10-CM | POA: Diagnosis not present

## 2017-02-02 DIAGNOSIS — R079 Chest pain, unspecified: Secondary | ICD-10-CM | POA: Diagnosis not present

## 2017-02-02 LAB — NM MYOCAR MULTI W/SPECT W/WALL MOTION / EF
CHL CUP NUCLEAR SDS: 0
CHL CUP NUCLEAR SRS: 13
LVDIAVOL: 46 mL (ref 46–106)
LVSYSVOL: 21 mL
SSS: 10
TID: 1

## 2017-02-02 LAB — TROPONIN I: Troponin I: <0.03 ng/mL (ref <0.03)

## 2017-02-02 MED ORDER — TECHNETIUM TC 99M TETROFOSMIN IV KIT
32.0400 | PACK | Freq: Once | INTRAVENOUS | Status: AC | PRN
  Administered 2017-02-02: 32.04 via INTRAVENOUS

## 2017-02-02 MED ORDER — TECHNETIUM TC 99M TETROFOSMIN IV KIT
14.2700 | PACK | Freq: Once | INTRAVENOUS | Status: AC | PRN
  Administered 2017-02-02: 14.27 via INTRAVENOUS

## 2017-02-02 MED ORDER — REGADENOSON 0.4 MG/5ML IV SOLN
0.4000 mg | Freq: Once | INTRAVENOUS | Status: DC
  Filled 2017-02-02: qty 5

## 2017-02-02 NOTE — Care Management Note (Signed)
Case Management Note  Patient Details  Name: Deborah Martinez MRN: [id number] Date of Birth: 1927-11-29  Subjective/Objective:                   Met with patient to discuss discharge planning. She is from home with her husband. She is independent at baseline- no DME.  She states that she has not PCP- Dr. Kary Kos is not her PCP.  She requests follow up with Dr. Ginette Pitman with [facility]- [facility] [name] is assisting with that. Action/Plan:     Home health list provided. No DME needed at this time. RNCM team to follow.   Expected Discharge Date:                  Expected Discharge Plan:     In-House Referral:     Discharge planning Services  CM Consult  Post Acute Care Choice:  Home Health Choice offered to:  Patient  DME Arranged:    DME Agency:     HH Arranged:    [place] Agency:     Status of Service:  In process, will continue to follow  If discussed at Long Length of Stay Meetings, dates discussed:    Additional Comments:  Marshell Garfinkel, RN 02/02/2017, 10:37 AM

## 2017-02-02 NOTE — Progress Notes (Signed)
OT Cancellation Note  Patient Details Name: Deborah Martinez MRN: [id number] DOB: 07/07/1927   Cancelled Treatment:    Reason Eval/Treat Not Completed: Patient at procedure or test/ unavailable. On 3rd attempt to see, pt currently having cardiac testing being performed. Will hold OT evaluation this date and re-attempt next date as pt is available and medically appropriate.   Richrd PrimeJamie Stiller, MPH, MS, OTR/L ascom [phone] 02/02/17, 2:55 PM

## 2017-02-02 NOTE — Progress Notes (Addendum)
OT Cancellation Note  Patient Details Name: Marene LenzBetty W Osment MRN: [id number] DOB: 1927/10/12   Cancelled Treatment:    Reason Eval/Treat Not Completed: Patient declined, no reason specified;Fatigue/lethargy limiting ability to participate. Order received, chart reviewed. Pt declined upon initial attempt due to fatigue/too sleepy. Pt agreeable to OT coming back at later time to re-attempt OT Evaluation. On 2nd attempt, pt now with stat troponin and cardiac testing pending. Will hold OT evaluation this morning and continue to follow acutely for appropriateness for OT evaluation after testing has been completed.   Richrd PrimeJamie Stiller, MPH, MS, OTR/L ascom [phone] 02/02/17, 10:11 AM

## 2017-02-02 NOTE — Progress Notes (Signed)
[employer] [facility] [facility] [place] [facility]   PATIENT NAME: Deborah Martinez    MR#:  [id number]  DATE OF BIRTH:  07-May-1927  SUBJECTIVE:denies any complaints.no dizziness while lying down.  Blood pressure high this morning.  CHIEF COMPLAINT:   Chief Complaint  Patient presents with  . Dizziness   REVIEW OF SYSTEMS:  CONSTITUTIONAL: No fever, fatigue or weakness.  EYES: No blurred or double vision.  EARS, NOSE, AND THROAT: No tinnitus or ear pain.  RESPIRATORY: No cough, shortness of breath, wheezing or hemoptysis.  CARDIOVASCULAR: No chest pain, orthopnea, edema.  GASTROINTESTINAL: No nausea, vomiting, diarrhea or abdominal pain.  GENITOURINARY: No dysuria, hematuria.  ENDOCRINE: No polyuria, nocturia,  HEMATOLOGY: No anemia, easy bruising or bleeding SKIN: No rash or lesion. MUSCULOSKELETAL: No joint pain or arthritis.   NEUROLOGIC: No tingling, numbness, weakness.  PSYCHIATRY: No anxiety or depression.   ROS  DRUG ALLERGIES:  No Known Allergies  VITALS:  Blood pressure (!) 181/96, pulse 64, temperature 98.2 F (36.8 C), temperature source Oral, resp. rate 18, height 5\' 6"  (1.676 m), weight 62.6 kg (138 lb 1.6 oz), SpO2 98 %.  PHYSICAL EXAMINATION:  GENERAL:  84 y.o.-year-old patient lying in the bed with no acute distress.  EYES: Pupils equal, round, reactive to light and accommodation. No scleral icterus. Extraocular muscles intact.  HEENT: Head atraumatic, normocephalic. Oropharynx and nasopharynx clear.  NECK:  Supple, no jugular venous distention. No thyroid enlargement, no tenderness.  LUNGS: Normal breath sounds bilaterally, no wheezing, rales,rhonchi or crepitation. No use of accessory muscles of respiration.  CARDIOVASCULAR: S1, S2 normal. No murmurs, rubs, or gallops.  ABDOMEN: Soft, nontender, nondistended. Bowel sounds present. No organomegaly or mass.  EXTREMITIES: No pedal edema, cyanosis, or clubbing.  NEUROLOGIC: Cranial nerves II through XII are  intact. Muscle strength 5/5 in all extremities. Sensation intact. Gait not checked.  PSYCHIATRIC: The patient is alert and oriented x 3.  SKIN: No obvious rash, lesion, or ulcer.   Physical Exam LABORATORY PANEL:   CBC Recent Labs  Lab 02/01/17 0346  WBC 7.0  HGB 13.0  HCT 37.2  PLT 233   ------------------------------------------------------------------------------------------------------------------  Chemistries  Recent Labs  Lab 02/01/17 0346  NA 138  K 3.6  CL 104  CO2 26  GLUCOSE 96  BUN 19  CREATININE 0.68  CALCIUM 8.5*   ------------------------------------------------------------------------------------------------------------------  Cardiac Enzymes Recent Labs  Lab 01/31/17 1551  TROPONINI <0.03   ------------------------------------------------------------------------------------------------------------------  RADIOLOGY:  Ct Head Wo Contrast  Result Date: 01/31/2017 CLINICAL DATA:  Ataxia with stroke suspected. EXAM: CT HEAD WITHOUT CONTRAST TECHNIQUE: Contiguous axial images were obtained from the base of the skull through the vertex without intravenous contrast. COMPARISON:  01/10/2017 FINDINGS: Brain: Patchy low-density in the bilateral cerebral white matter and deep gray nuclei appears stable from prior and is attributed to chronic ischemia. Low-density in the central right thalamus is better seen than on prior but was likely present previously. Age congruent cerebral volume loss. No hemorrhage, hydrocephalus, or masslike finding. Vascular: Atherosclerotic calcification.  No hyperdense vessel. Skull: Negative Sinuses/Orbits: Negative IMPRESSION: 1. No acute finding when compared to prior. 2. Chronic small vessel ischemia. Electronically Signed   By: Marnee Spring M.D.   On: 01/31/2017 18:09   Mr Brain Wo Contrast  Result Date: 02/01/2017 CLINICAL DATA:  84 year old female with ataxia, recent onset dizziness. A fib. EXAM: MRI HEAD WITHOUT CONTRAST  TECHNIQUE: Multiplanar, multiecho pulse sequences of the brain and surrounding structures were obtained without intravenous contrast. COMPARISON:  Head CT without contrast 01/31/2017, and earlier. FINDINGS: Brain: No restricted diffusion to suggest acute infarction. No midline shift, mass effect, evidence of mass lesion, ventriculomegaly, extra-axial collection or acute intracranial hemorrhage. Cervicomedullary junction within normal limits. Cerebral volume is within normal limits for age. Partially empty sella. Widely scattered bilateral cerebral white matter T2 and FLAIR hyperintensity. The configuration is nonspecific, although there involvement of the anterior right deep white matter capsules. Mild for age T2 heterogeneity in the deep gray matter nuclei, which appears in part related to perivascular spaces. Moderate confluent T2 hyperintensity in the pons. No cortical encephalomalacia or chronic cerebral blood products. Vascular: Major intracranial vascular flow voids are preserved, with generalized intracranial artery tortuosity. Skull and upper cervical spine: Multilevel mild spondylolisthesis in the cervical spine with advanced disc degeneration, but no definite upper cervical spinal stenosis. Normal bone marrow signal. Sinuses/Orbits: Postoperative changes to the right globe. Otherwise normal orbits soft tissues. Minor paranasal sinus mucosal thickening. Other: Visible internal auditory structures appear normal. Mild chronic sclerosis of the mastoid air cells which otherwise are clear. Scalp and face soft tissues appear negative. IMPRESSION: 1.  No acute infarct or other intracranial abnormality identified. 2. Mild to moderate for age nonspecific signal changes in the brain, most commonly due to chronic small vessel disease. Electronically Signed   By: Odessa FlemingH  Hall M.D.   On: 02/01/2017 08:33   [place] Carotid Bilateral  Result Date: 02/01/2017 CLINICAL DATA:  Presented with dizziness. EXAM: BILATERAL CAROTID  DUPLEX ULTRASOUND TECHNIQUE: Wallace CullensGray scale imaging, color Doppler and duplex ultrasound were performed of bilateral carotid and vertebral arteries in the neck. COMPARISON:  MRI 02/01/2017 FINDINGS: Criteria: Quantification of carotid stenosis is based on velocity parameters that correlate the residual internal carotid diameter with NASCET-based stenosis levels, using the diameter of the distal internal carotid lumen as the denominator for stenosis measurement. The following velocity measurements were obtained: RIGHT ICA:  39 cm/sec CCA:  39 cm/sec SYSTOLIC ICA/CCA RATIO:  1.0 DIASTOLIC ICA/CCA RATIO:  1.3 ECA:  47 cm/sec LEFT ICA:  68 cm/sec CCA:  41 cm/sec SYSTOLIC ICA/CCA RATIO:  1.7 DIASTOLIC ICA/CCA RATIO:  1.8 ECA:  44 cm/sec RIGHT CAROTID ARTERY: Eccentric plaque in the right common carotid artery. Small amount of plaque at the right carotid bulb. External carotid artery is patent with normal waveform. Normal waveforms and velocities in the internal carotid artery. RIGHT VERTEBRAL ARTERY: Antegrade flow and normal waveform in the right vertebral artery. LEFT CAROTID ARTERY: Echogenic plaque at the left carotid bulb. Echogenic plaque at the origin of the external carotid artery. External carotid artery is patent with normal waveform. Normal waveforms and velocities in the internal carotid artery. LEFT VERTEBRAL ARTERY: Antegrade flow and normal waveform in the left vertebral artery. IMPRESSION: Mild atherosclerotic disease in the bilateral carotid arteries. Estimated degree of stenosis in the internal carotid arteries is less than 50% bilaterally. Patent vertebral arteries with antegrade flow. Electronically Signed   By: Richarda OverlieAdam  Henn M.D.   On: 02/01/2017 10:57    ASSESSMENT AND PLAN:  84 y.o. female with a known history of Anxiety, Htn, HLD, Thyroid disease, had admission for chest pain 2 weeks ago w/ plan for stress test Monday as out pt, presents w/ dizziness, workup unimpressive in the emergency room,  patient subsequently admitted to observation for possible TIA   1 acute dizziness,' MRI of the brain negative for stroke, ultrasound of carotids did not show any hemodynamically significant stenosis.  Suspect may be related to orthostasis Orthostatic vitals were positive-decreased  Norvasc to 2.5 mg daily, decrease losartan to 25 mg daily, MRI negative for acute stroke, carotid Dopplers negative for hemodynamically significant stenosis, echocardiogram done last month noted for stage I diastolic dysfunction/normal ejection fraction, continue aspirin, statin therapy, Continue meclizine, physical therapy recommended home health physical therapy.  Ambulate today and see how she does today regarding orthostatic vitals.    2 acute orthostasis Stable Orthostatic vitals were positive Decrease Norvasc to 2.5 mg daily, decrease losartan to 25 mg daily, vitals per routine, check orthostatics in the morning, make changes as per necessary Patient may have to have further reduction in antihypertensives, may have to run blood pressure high to avoid recurrent dizziness, but will be at increased risk for strokes  3. acute probable urinary tract infection continue Rocephin, history of E. coli UTI in December of the last year.  So far urine cultures did not show any growth yet.  4 .acute immobility syndrome Currently requiring 2 person assist to help with standing per nursing staff  Physical therapy, recommends home health physical therapy  5 .chronic benign essential hypertension Stable Continue home regiment, PRN hydralazine for systolic blood pressure greater than 160, vitals per routine, make changes as per necessary  6 .chronic hyperlipidemia, unspecified Stable Continue statin therapy  7. history of recent admission for chest pain; order Lexiscan stress test today. . All the records are reviewed and case discussed with Care Management/Social Workerr. Management plans discussed with the patient,  family and they are in agreement.  CODE STATUS: full  TOTAL TIME TAKING CARE OF THIS PATIENT: 35 minutes.     POSSIBLE D/C IN 1-2 DAYS, DEPENDING ON CLINICAL CONDITION.   Katha Hamming M.D on 02/02/2017   Between 7am to 6pm - Pager - [phone]  After 6pm go to [email or web address] - password [email or web address]  [facility]  Office  [phone]  CC: Primary care physician; [name], MD  Note: This dictation was prepared with Dragon dictation along with smaller phrase technology. Any transcriptional errors that result from this process are unintentional.

## 2017-02-03 LAB — URINE CULTURE: Culture: <10000 — AB

## 2017-02-03 LAB — GLUCOSE, CAPILLARY: GLUCOSE-CAPILLARY: 124 mg/dL — AB (ref 65–99)

## 2017-02-03 MED ORDER — LOSARTAN POTASSIUM 25 MG PO TABS: 25.0000 mg | ORAL_TABLET | Freq: Every day | ORAL | 0 refills | Status: DC

## 2017-02-03 MED ORDER — AMLODIPINE BESYLATE 2.5 MG PO TABS: 2.5000 mg | ORAL_TABLET | Freq: Every day | ORAL | 0 refills | Status: DC

## 2017-02-03 MED ORDER — MECLIZINE HCL 12.5 MG PO TABS: 12.5000 mg | ORAL_TABLET | Freq: Three times a day (TID) | ORAL | 0 refills | Status: AC | PRN

## 2017-02-03 NOTE — Progress Notes (Signed)
Physical Therapy Treatment Patient Details Name: Deborah Martinez MRN: [id number] DOB: January 20, 1927 Today's Date: 02/03/2017    History of Present Illness 84 yo Female came to ED with dizziness; Patient was recently admitted to the hospital 2 weeks ago with chest pain; She was supposed to have a cardiac stress test on 02/02/17 as outpatient; Patient has PMH significatn for anxiety, HTN, HLD and thyroid disease; MRI was negative for acute findings;     PT Comments    Pt reported she is feeling better and ready to go home.  Pt was able to get in and out of bed with ease.  Walked around nursing unit x 1 and to bathroom with supervision and no AD.  She reported no dizziness.  Stated she felt somewhat unsteady without walker (was using with nursing to walk in hallways) but felt ok.  Some minor imbalances noted but she did not need any assist to recover.  Discussed rolling walker for home especially for community or outside ambulation.  After thought, she declined walker stating she would get one later if she needed one.      Follow Up Recommendations  Home health PT;Supervision for mobility/OOB     Equipment Recommendations       Recommendations for Other Services       Precautions / Restrictions Precautions Precautions: Fall Restrictions Weight Bearing Restrictions: No    Mobility  Bed Mobility Overal bed mobility: Modified Independent                Transfers Overall transfer level: Modified independent Equipment used: None Transfers: Sit to/from Stand Sit to Stand: Supervision         General transfer comment: close SBA, no dizziness noted  Ambulation/Gait Ambulation/Gait assistance: Supervision;Min guard Ambulation Distance (Feet): 160 Feet Assistive device: None Gait Pattern/deviations: Step-through pattern   Gait velocity interpretation: at or above normal speed for age/gender     Stairs            Wheelchair Mobility    Modified Rankin (Stroke  Patients Only)       Balance Overall balance assessment: Needs assistance Sitting-balance support: Feet supported;No upper extremity supported Sitting balance-Leahy Scale: Good     Standing balance support: No upper extremity supported;During functional activity Standing balance-Leahy Scale: Fair                              Cognition Arousal/Alertness: Awake/alert Behavior During Therapy: WFL for tasks assessed/performed Overall Cognitive Status: Within Functional Limits for tasks assessed Area of Impairment: Orientation                 Orientation Level: Disoriented to;Place;Time     Following Commands: Follows multi-step commands consistently       General Comments: Oriented to self, situation      Exercises      General Comments        Pertinent Vitals/Pain Pain Assessment: No/denies pain    Home Living Family/patient expects to be discharged to:: Private residence Living Arrangements: Spouse/significant other Available Help at Discharge: Family;Available 24 hours/day Type of Home: Independent living facility Home Access: Level entry   Home Layout: One level Home Equipment: Cane - single point;Shower seat      Prior Function Level of Independence: Independent      Comments: Pt reports she was Ind PTA with all aspects of mobility.  Food is provided by facility.  Pt no longer drives.  Did  not use any assistive device. Reports indep with med mgt   PT Goals (current goals can now be found in the care plan section) Acute Rehab PT Goals Patient Stated Goal: "I want to go home."  Progress towards PT goals: Progressing toward goals    Frequency    Min 2X/week      PT Plan Current plan remains appropriate    Co-evaluation              AM-PAC PT "6 Clicks" Daily Activity  Outcome Measure  Difficulty turning over in bed (including adjusting bedclothes, sheets and blankets)?: None Difficulty moving from lying on back to  sitting on the side of the bed? : None Difficulty sitting down on and standing up from a chair with arms (e.g., wheelchair, bedside commode, etc,.)?: None Help needed moving to and from a bed to chair (including a wheelchair)?: None Help needed walking in hospital room?: A Little Help needed climbing 3-5 steps with a railing? : A Little 6 Click Score: 22    End of Session Equipment Utilized During Treatment: Gait belt Activity Tolerance: Patient tolerated treatment well Patient left: in bed;with bed alarm set;with call bell/phone within reach Nurse Communication: Mobility status       Time: 0913-0921 PT Time Calculation (min) (ACUTE ONLY): 8 min  Charges:  $Gait Training: 8-22 mins                    G Codes:       Danielle DessSarah Dotti Busey, PTA 02/03/17, 10:21 AM

## 2017-02-03 NOTE — Discharge Summary (Signed)
Deborah Martinez, is a 84 y.o. female  DOB [birth year]  MRN [id number].  Admission date:  01/31/2017  Admitting Physician  Altamese Dilling, MD  Discharge Date:  02/03/2017   Primary MD  [name], MD  Recommendations for primary care physician for things to follow:   Follow-up with PCP in 1 week   Admission Diagnosis  Ataxia [R27.0] Cerebral infarction (HCC) [I63.9] CVA (cerebral infarction) [I63.9]   Discharge Diagnosis  Ataxia [R27.0] Cerebral infarction (HCC) [I63.9] CVA (cerebral infarction) [I63.9]   Principal Problem:   Dizziness Active Problems:   UTI (urinary tract infection)      Past Medical History:  Diagnosis Date  . Anxiety   . Constipation   . Cystocele   . Depression   . Hemorrhoid   . Hypercholesterolemia   . Hypertension   . Incomplete bladder emptying   . Postmenopausal atrophic vaginitis   . Rectocele   . Thyroid disease   . UTI (lower urinary tract infection)   . Vaginal enterocele     Past Surgical History:  Procedure Laterality Date  . APPENDECTOMY    . EYE SURGERY  2012   remve cataract  . TOTAL ABDOMINAL HYSTERECTOMY  1977       History of present illness and  Hospital Course:     Kindly see H&P for history of present illness and admission details, please review complete Labs, Consult reports and Test reports for all details in brief  HPI  from the history and physical done on the day of admission  84 year old female patient came in because of dizziness, near syncope.  Patient felt very dizzy when she stood up.  She was admitted recently on December 29 and discharged home.  Hospital Course  #1. dizziness secondary to orthostatic hypotension admitted to hospital service for evaluation of TIA.  Monitored on telemetry.  Brain MRI did not show acute stroke.   Echocardiogram showed EF more than 70%.  Patient ultrasound of carotids did not show any hemodynamically significant stenosis.  Patient also had a Lexiscan stress test which showed normal wall motion with EF of 70%.  Dizziness thought to be secondary to related to her blood pressure medication.  We decreased the dose of Norvasc to 2.5 mg daily, also decreased her losartan dose to 25 mg daily.  After changing the BP medicines patient dizziness, orthostatic hypotension improved.  She walked with physical therapy.  She feels much better today and dizziness almost resolved.  Discharged home with home health physical therapy . 2.  Hyperlipidemia: Continue statins Stable 3.  Recent admission for chest pain: Lexiscan stress test negative this time.  Troponins also negative for 2 times. #4 for dizziness and possible placement vertigo started on meclizine.  #4. .orthostatic hypotension blood pressure supine 117/73, sitting 94/63, standing 88/65.  Patient did have dizziness with position change that improved with adjustment of BP medicines.   Discharge Condition: Stable   Follow UP  Follow-up Information    [name], MD On 02/17/2017.   Specialty:  Family Medicine Why:  2-week hospital F/U; @ 10:45 am Contact information: [address] [facility] [place] [address] [phone]             Discharge Instructions  and  Discharge Medication   Allergies as of 02/03/2017   No Known Allergies     Medication List    STOP taking these medications   LORazepam 0.5 MG tablet Commonly known as:  ATIVAN   potassium  chloride 10 MEQ tablet Commonly known as:  K-DUR     TAKE these medications   alendronate 70 MG tablet Commonly known as:  FOSAMAX Take 70 mg by mouth once a week. Take with a full glass of water on an empty stomach.   amLODipine 2.5 MG tablet Commonly known as:  NORVASC Take 1 tablet (2.5 mg total) by mouth daily. What changed:    medication  strength  how much to take   aspirin 81 MG tablet Take 81 mg by mouth daily.   atorvastatin 10 MG tablet Commonly known as:  LIPITOR Take 10 mg by mouth.   citalopram 40 MG tablet Commonly known as:  CELEXA Take 40 mg by mouth daily.   levothyroxine 50 MCG tablet Commonly known as:  SYNTHROID, LEVOTHROID Take 50 mcg by mouth every morning.   losartan 25 MG tablet Commonly known as:  COZAAR Take 1 tablet (25 mg total) by mouth daily. What changed:    medication strength  how much to take   meclizine 12.5 MG tablet Commonly known as:  ANTIVERT Take 1 tablet (12.5 mg total) by mouth 3 (three) times daily as needed for dizziness.   meloxicam 15 MG tablet Commonly known as:  MOBIC Take 15 mg by mouth daily.   pregabalin 50 MG capsule Commonly known as:  LYRICA Take 50 mg by mouth 3 (three) times daily.   valACYclovir 1000 MG tablet Commonly known as:  VALTREX Take 1 tablet by mouth daily as directed   venlafaxine XR 150 MG 24 hr capsule Commonly known as:  EFFEXOR-XR venlafaxine ER 37.5 mg capsule,extended release 24 hr  Start 1 tablet po q day for 1 week then 2 tablets po q day x1 week and continue titrating to 150 mg po q day         Diet and Activity recommendation: See Discharge Instructions above   Consults obtained -physical therapy   Major procedures and Radiology Reports - PLEASE review detailed and final reports for all details, in brief -      Ct Head Wo Contrast  Result Date: 01/31/2017 CLINICAL DATA:  Ataxia with stroke suspected. EXAM: CT HEAD WITHOUT CONTRAST TECHNIQUE: Contiguous axial images were obtained from the base of the skull through the vertex without intravenous contrast. COMPARISON:  01/10/2017 FINDINGS: Brain: Patchy low-density in the bilateral cerebral white matter and deep gray nuclei appears stable from prior and is attributed to chronic ischemia. Low-density in the central right thalamus is better seen than on prior but was  likely present previously. Age congruent cerebral volume loss. No hemorrhage, hydrocephalus, or masslike finding. Vascular: Atherosclerotic calcification.  No hyperdense vessel. Skull: Negative Sinuses/Orbits: Negative IMPRESSION: 1. No acute finding when compared to prior. 2. Chronic small vessel ischemia. Electronically Signed   By: Marnee Spring M.D.   On: 01/31/2017 18:09   Ct Head Wo Contrast  Result Date: 01/10/2017 CLINICAL DATA:  Dizziness. EXAM: CT HEAD WITHOUT CONTRAST TECHNIQUE: Contiguous axial images were obtained from the base of the skull through the vertex without intravenous contrast. COMPARISON:  None. FINDINGS: Brain: No evidence of acute infarction, hemorrhage, hydrocephalus, extra-axial collection or mass lesion/mass effect. Mild-to-moderate age-related cerebral atrophy with compensatory dilatation of the ventricles. Moderate periventricular white matter and corona radiata hypodensities favor chronic ischemic microvascular white matter disease. Vascular: Atherosclerotic vascular calcification of the carotid siphons. No hyperdense vessel. Skull: Negative for fracture or focal lesion. Sinuses/Orbits: No acute finding. Trace mucosal thickening in the right sphenoid sinus. Other: None. IMPRESSION:  1. No acute intracranial abnormality. Mild-to-moderate cerebral atrophy and chronic microvascular ischemic white matter disease. Electronically Signed   By: Obie DredgeWilliam T Derry M.D.   On: 01/10/2017 13:58   Mr Brain Wo Contrast  Result Date: 02/01/2017 CLINICAL DATA:  84 year old female with ataxia, recent onset dizziness. A fib. EXAM: MRI HEAD WITHOUT CONTRAST TECHNIQUE: Multiplanar, multiecho pulse sequences of the brain and surrounding structures were obtained without intravenous contrast. COMPARISON:  Head CT without contrast 01/31/2017, and earlier. FINDINGS: Brain: No restricted diffusion to suggest acute infarction. No midline shift, mass effect, evidence of mass lesion, ventriculomegaly,  extra-axial collection or acute intracranial hemorrhage. Cervicomedullary junction within normal limits. Cerebral volume is within normal limits for age. Partially empty sella. Widely scattered bilateral cerebral white matter T2 and FLAIR hyperintensity. The configuration is nonspecific, although there involvement of the anterior right deep white matter capsules. Mild for age T2 heterogeneity in the deep gray matter nuclei, which appears in part related to perivascular spaces. Moderate confluent T2 hyperintensity in the pons. No cortical encephalomalacia or chronic cerebral blood products. Vascular: Major intracranial vascular flow voids are preserved, with generalized intracranial artery tortuosity. Skull and upper cervical spine: Multilevel mild spondylolisthesis in the cervical spine with advanced disc degeneration, but no definite upper cervical spinal stenosis. Normal bone marrow signal. Sinuses/Orbits: Postoperative changes to the right globe. Otherwise normal orbits soft tissues. Minor paranasal sinus mucosal thickening. Other: Visible internal auditory structures appear normal. Mild chronic sclerosis of the mastoid air cells which otherwise are clear. Scalp and face soft tissues appear negative. IMPRESSION: 1.  No acute infarct or other intracranial abnormality identified. 2. Mild to moderate for age nonspecific signal changes in the brain, most commonly due to chronic small vessel disease. Electronically Signed   By: Odessa FlemingH  Hall M.D.   On: 02/01/2017 08:33   [place] Carotid Bilateral  Result Date: 02/01/2017 CLINICAL DATA:  Presented with dizziness. EXAM: BILATERAL CAROTID DUPLEX ULTRASOUND TECHNIQUE: Wallace CullensGray scale imaging, color Doppler and duplex ultrasound were performed of bilateral carotid and vertebral arteries in the neck. COMPARISON:  MRI 02/01/2017 FINDINGS: Criteria: Quantification of carotid stenosis is based on velocity parameters that correlate the residual internal carotid diameter with NASCET-based  stenosis levels, using the diameter of the distal internal carotid lumen as the denominator for stenosis measurement. The following velocity measurements were obtained: RIGHT ICA:  39 cm/sec CCA:  39 cm/sec SYSTOLIC ICA/CCA RATIO:  1.0 DIASTOLIC ICA/CCA RATIO:  1.3 ECA:  47 cm/sec LEFT ICA:  68 cm/sec CCA:  41 cm/sec SYSTOLIC ICA/CCA RATIO:  1.7 DIASTOLIC ICA/CCA RATIO:  1.8 ECA:  44 cm/sec RIGHT CAROTID ARTERY: Eccentric plaque in the right common carotid artery. Small amount of plaque at the right carotid bulb. External carotid artery is patent with normal waveform. Normal waveforms and velocities in the internal carotid artery. RIGHT VERTEBRAL ARTERY: Antegrade flow and normal waveform in the right vertebral artery. LEFT CAROTID ARTERY: Echogenic plaque at the left carotid bulb. Echogenic plaque at the origin of the external carotid artery. External carotid artery is patent with normal waveform. Normal waveforms and velocities in the internal carotid artery. LEFT VERTEBRAL ARTERY: Antegrade flow and normal waveform in the left vertebral artery. IMPRESSION: Mild atherosclerotic disease in the bilateral carotid arteries. Estimated degree of stenosis in the internal carotid arteries is less than 50% bilaterally. Patent vertebral arteries with antegrade flow. Electronically Signed   By: Richarda OverlieAdam  Henn M.D.   On: 02/01/2017 10:57   Nm Myocar Multi W/spect W/wall Motion / Ef  Result Date:  02/02/2017  There was no ST segment deviation noted during stress.  No T wave inversion was noted during stress.  The study is normal.  This is a low risk study.  The left ventricular ejection fraction is normal (55-65%).     Micro Results    Recent Results (from the past 240 hour(s))  Urine Culture     Status: Abnormal   Collection Time: 02/01/17  2:13 PM  Result Value Ref Range Status   Specimen Description   Final    URINE, RANDOM Performed at [facility], [address], [place], [place] [address]     Special Requests   Final    NONE Performed at [facility], [address] [place], [place] [address]    Culture (A)  Final    <10,000 COLONIES/mL INSIGNIFICANT GROWTH Performed at [facility], [address], [place], [place] [address]    Report Status 02/03/2017 FINAL  Final       Today   Subjective:   Deborah Martinez today has no headache,no chest abdominal pain,no new weakness tingling or numbness, feels much better wants to go home today.   Objective:   Blood pressure 132/82, pulse 84, temperature 97.7 F (36.5 C), temperature source Oral, resp. rate 19, height 5\' 6"  (1.676 m), weight 62.7 kg (138 lb 4.8 oz), SpO2 96 %.   Intake/Output Summary (Last 24 hours) at 02/03/2017 1723 Last data filed at 02/03/2017 1029 Gross per 24 hour  Intake 770 ml  Output 1000 ml  Net -230 ml    Exam Awake Alert, Oriented x 3, No new F.N deficits, Normal affect [place].AT,PERRAL Supple Neck,No JVD, No cervical lymphadenopathy appriciated.  Symmetrical Chest wall movement, Good air movement bilaterally, CTAB RRR,No Gallops,Rubs or new Murmurs, No Parasternal Heave +ve B.Sounds, Abd Soft, Non tender, No organomegaly appriciated, No rebound -guarding or rigidity. No Cyanosis, Clubbing or edema, No new Rash or bruise  Data Review   CBC w Diff:  Lab Results  Component Value Date   WBC 7.0 02/01/2017   HGB 13.0 02/01/2017   HCT 37.2 02/01/2017   PLT 233 02/01/2017   LYMPHOPCT 23 12/16/2016   MONOPCT 14 12/16/2016   EOSPCT 1 12/16/2016   BASOPCT 1 12/16/2016    CMP:  Lab Results  Component Value Date   NA 138 02/01/2017   K 3.6 02/01/2017   CL 104 02/01/2017   CO2 26 02/01/2017   BUN 19 02/01/2017   CREATININE 0.68 02/01/2017   PROT 5.4 (L) 01/11/2017   ALBUMIN 3.2 (L) 01/11/2017   BILITOT 0.8 01/11/2017   ALKPHOS 49 01/11/2017   AST 26 01/11/2017   ALT 23 01/11/2017  .   Total Time in preparing paper work, data evaluation and todays exam - 35  minutes  Katha Hamming M.D on 02/03/2017 at 5:23 PM    Note: This dictation was prepared with Dragon dictation along with smaller phrase technology. Any transcriptional errors that result from this process are unintentional.

## 2017-02-03 NOTE — Progress Notes (Signed)
Patient discharged. Front wheeled walker delivered and taken by patient's husband. Patient's husband at bedside for discharge teaching and all questions answered from patient and husband. Patient and husband able to verbalize discharge instructions. New medication reviewed and handout given to patient.

## 2017-02-03 NOTE — Evaluation (Addendum)
Occupational Therapy Evaluation Patient Details Name: Deborah Martinez MRN: [id number] DOB: 10-Aug-1927 Today's Date: 02/03/2017    History of Present Illness 84 yo Female came to ED with dizziness; Patient was recently admitted to the hospital 2 weeks ago with chest pain; She was supposed to have a cardiac stress test on 02/02/17 as outpatient; Patient has PMH significatn for anxiety, HTN, HLD and thyroid disease; MRI was negative for acute findings;    Clinical Impression   Pt seen for OT evaluation this date, preparing for discharge home to [place] with [place]. Pt alert and oriented to self and situation, able to follow all commands. Grossly at baseline independence with ADL tasks. Educated in strategies to prevent falls and minimize risk of OH. Pt verbalized understanding. No additional skilled OT needs at this time. Will sign off. Please re-consult if additional needs arise.     Follow Up Recommendations  No OT follow up    Equipment Recommendations  None recommended by OT    Recommendations for Other Services       Precautions / Restrictions Precautions Precautions: Fall Restrictions Weight Bearing Restrictions: No      Mobility Bed Mobility Overal bed mobility: Modified Independent                Transfers Overall transfer level: Needs assistance Equipment used: None Transfers: Sit to/from Stand Sit to Stand: Supervision;Min guard         General transfer comment: close SBA, no dizziness noted    Balance Overall balance assessment: Needs assistance Sitting-balance support: No upper extremity supported;Feet supported Sitting balance-Leahy Scale: Good     Standing balance support: No upper extremity supported;During functional activity Standing balance-Leahy Scale: Fair                             ADL either performed or assessed with clinical judgement   ADL Overall ADL's : At baseline;Modified independent                                        General ADL Comments: grossly at supervision level for ADL, educated in falls prevention strategies for self care tasks to minimize risk of falls/dizziness     Vision Baseline Vision/History: Wears glasses Wears Glasses: At all times Patient Visual Report: No change from baseline Vision Assessment?: No apparent visual deficits     Perception     Praxis      Pertinent Vitals/Pain Pain Assessment: No/denies pain     Hand Dominance     Extremity/Trunk Assessment Upper Extremity Assessment Upper Extremity Assessment: Overall WFL for tasks assessed   Lower Extremity Assessment Lower Extremity Assessment: Generalized weakness   Cervical / Trunk Assessment Cervical / Trunk Assessment: Kyphotic   Communication Communication Communication: No difficulties   Cognition Arousal/Alertness: Awake/alert Behavior During Therapy: WFL for tasks assessed/performed Overall Cognitive Status: No family/caregiver present to determine baseline cognitive functioning Area of Impairment: Orientation                 Orientation Level: Disoriented to;Place;Time     Following Commands: Follows multi-step commands consistently       General Comments: Oriented to self, situation   General Comments       Exercises     Shoulder Instructions      Home Living Family/patient expects to be discharged to:: Private residence Living  Arrangements: Spouse/significant other Available Help at Discharge: Family;Available 24 hours/day Type of Home: Independent living facility Home Access: Level entry     Home Layout: One level     Bathroom Shower/Tub: Producer, television/film/videoWalk-in shower   Bathroom Toilet: Standard     Home Equipment: Cane - single point;Shower seat          Prior Functioning/Environment Level of Independence: Independent        Comments: Pt reports she was Ind PTA with all aspects of mobility.  Food is provided by facility.  Pt no longer drives.  Did not use any  assistive device. Reports indep with med mgt        OT Problem List:        OT Treatment/Interventions:      OT Goals(Current goals can be found in the care plan section) Acute Rehab OT Goals Patient Stated Goal: "I want to go home."  OT Goal Formulation: All assessment and education complete, DC therapy  OT Frequency:     Barriers to D/C:            Co-evaluation              AM-PAC PT "6 Clicks" Daily Activity     Outcome Measure Help from another person eating meals?: None Help from another person taking care of personal grooming?: None Help from another person toileting, which includes using toliet, bedpan, or urinal?: A Little Help from another person bathing (including washing, rinsing, drying)?: A Little Help from another person to put on and taking off regular upper body clothing?: None Help from another person to put on and taking off regular lower body clothing?: A Little 6 Click Score: 21   End of Session    Activity Tolerance: Patient tolerated treatment well Patient left: in bed;with call bell/phone within reach;with bed alarm set  OT Visit Diagnosis: Other abnormalities of gait and mobility (R26.89);Dizziness and giddiness (R42)                Time: [id number] OT Time Calculation (min): 10 min Charges:  OT General Charges $OT Visit: 1 Visit OT Evaluation $OT Eval Low Complexity: 1 Low  Richrd PrimeJamie Stiller, MPH, MS, OTR/L ascom [phone] 02/03/17, 9:57 AM

## 2017-02-03 NOTE — Care Management Note (Signed)
Case Management Note  Patient Details  Name: Deborah Martinez MRN: [id number] Date of Birth: 1927-11-26  Subjective/Objective: Met with patient at bedside. Patient lives with her spouse who will assist her as needed. Patient unable to have PT since she does not have a PCP. She reports no falls. Ordered a walker from [place] with [facility]. Patient getting set up with a PCP.                  Action/Plan: Gilford Rile from [facility]  Expected Discharge Date:  02/03/17               Expected Discharge Plan:  Home/Self Care  In-House Referral:     Discharge planning Services  CM Consult  Post Acute Care Choice:    Choice offered to:  Patient  DME Arranged:  Gilford Rile rolling DME Agency:  [place]:    [place]:     Status of Service:  Completed, signed off  If discussed at [place] of Stay Meetings, dates discussed:    Additional Comments:  Jolly Mango, RN 02/03/2017, 9:47 AM

## 2017-02-04 NOTE — Care Management (Signed)
TC received from patients spouse. He states he wants patient to have HHPT. He also states her PCP is Dr. Burnett ShengHedrick and she last seen him 2-3 weeks ago. Offered choice of home health agencies. Referral to [facility] for HHPT. Spouse very pleasant and thankful for the assistance.

## 2017-05-27 ENCOUNTER — Encounter: Payer: Medicare Other | Admitting: Obstetrics and Gynecology

## 2017-05-27 ENCOUNTER — Encounter: Payer: Self-pay | Admitting: Obstetrics and Gynecology

## 2017-05-29 NOTE — Progress Notes (Signed)
This encounter was created in error - please disregard.

## 2017-06-02 ENCOUNTER — Encounter: Payer: Medicare Other | Admitting: Obstetrics and Gynecology

## 2017-06-10 ENCOUNTER — Encounter: Payer: Medicare Other | Admitting: Obstetrics and Gynecology

## 2017-06-16 ENCOUNTER — Encounter: Payer: Medicare Other | Admitting: Obstetrics and Gynecology

## 2017-07-07 ENCOUNTER — Ambulatory Visit (INDEPENDENT_AMBULATORY_CARE_PROVIDER_SITE_OTHER): Payer: Medicare Other | Admitting: Obstetrics and Gynecology

## 2017-07-07 ENCOUNTER — Encounter: Payer: Self-pay | Admitting: Obstetrics and Gynecology

## 2017-07-07 ENCOUNTER — Other Ambulatory Visit: Payer: Self-pay | Admitting: Obstetrics and Gynecology

## 2017-07-07 VITALS — BP 90/60 | HR 103 | Ht 66.0 in | Wt 151.7 lb

## 2017-07-07 DIAGNOSIS — Z4689 Encounter for fitting and adjustment of other specified devices: Secondary | ICD-10-CM | POA: Diagnosis not present

## 2017-07-07 DIAGNOSIS — I639 Cerebral infarction, unspecified: Secondary | ICD-10-CM

## 2017-07-07 DIAGNOSIS — N8111 Cystocele, midline: Secondary | ICD-10-CM | POA: Diagnosis not present

## 2017-07-07 DIAGNOSIS — R339 Retention of urine, unspecified: Secondary | ICD-10-CM

## 2017-07-07 DIAGNOSIS — K5900 Constipation, unspecified: Secondary | ICD-10-CM

## 2017-07-07 DIAGNOSIS — N952 Postmenopausal atrophic vaginitis: Secondary | ICD-10-CM | POA: Diagnosis not present

## 2017-07-07 MED ORDER — OXYQUINOLONE SULFATE 0.025 % VA GEL: 0.2500 g | VAGINAL | 3 refills | Status: DC

## 2017-07-07 NOTE — Patient Instructions (Signed)
1.  Return in 4 months for pessary maintenance 2.  Recommend Trimosan gel intravaginal 1 applicator weekly 3.  Strongly encouraged Colace but 100 mg twice a day, 6 to 8 glasses of water a day, and MiraLAX as needed to help minimize constipation symptoms.

## 2017-07-07 NOTE — Progress Notes (Signed)
1.  Scratch that Chief complaint: 1.  Pessary maintenance 2.  Still with incomplete bladder emptying 3.  Rectocele 4.  Chronic constipation  Deborah Martinez presents today for pessary check.  Last visit 01/22/2017. She is using ring with support pessary which helps with more comprehensive bladder emptying. She denies vaginal bleeding, vaginal odor, vaginal discharge, or pelvic pain. She does have intermittent issues with constipation; she is not routinely taking any medications to help regulate bowel function.  Patient [place] now living in [place] permanently [place]  Past Medical History:  Diagnosis Date  . Anxiety   . Constipation   . Cystocele   . Depression   . Hemorrhoid   . Hypercholesterolemia   . Hypertension   . Incomplete bladder emptying   . Postmenopausal atrophic vaginitis   . Rectocele   . Thyroid disease   . UTI (lower urinary tract infection)   . Vaginal enterocele    Past Surgical History:  Procedure Laterality Date  . APPENDECTOMY    . EYE SURGERY  2012   remve cataract  . TOTAL ABDOMINAL HYSTERECTOMY  1977   Review of systems: Comprehensive review of systems is negative except for that noted in HPI  OBJECTIVE: BP 90/60   Pulse (!) 103   Ht 5\' 6"  (1.676 m)   Wt 151 lb 11.2 oz (68.8 kg)   BMI 24.49 kg/m  Pleasant elderly female in no acute distress.  Alert and oriented.  Affect is appropriate. Back: No CVA tenderness Abdomen: Soft, nontender; no organomegaly; no bladder tenderness Pelvic: External genitalia-atrophic changes BUS-normal Vagina-decreased estrogen effect; no significant discharge; mild hyperemia is noted at the vaginal apex; there is abundant firm stool in the rectal vault which is slightly distorting pessary positioning; patient noted difficulty with pessary removal because of the constipation. Cervix-surgically absent Uterus have been surgically absent Bimanual-no abdominal pelvic masses; there is firm  abundant stool in the rectal vault distorting the vaginal canal Rectovaginal-normal external exam  PROCEDURE: Pessary maintenance Ring with support pessary is removed, cleaned, and reinserted  ASSESSMENT: 1.  Pessary maintenance 2.  Cystocele with incomplete bladder emptying, improved with pessary use 3.  Rectocele, symptomatic 4.  Chronic constipation, persists.  Patient noncompliant with recommendations for constipation management 5.  Vaginal atrophy, stable  PLAN: 1.  Trimosan gel intravaginal is prescribed and sent to pharmacy; to be applied intravaginal once a week 2.  Strongly encouraged to increase water intake 6 glasses a day 3.  Strongly encouraged use of Colace 100 mg twice a day 4.  Strongly encouraged MiraLAX use for elimination of constipation symptoms 5.  Return in 4 months for follow-up pessary maintenance  A total of 15 minutes were spent face-to-face with the patient during this encounter and over half of that time dealt with counseling and coordination of care.  Herold HarmsMartin A Tomislav Micale, MD  Note: This dictation was prepared with Dragon dictation along with smaller phrase technology. Any transcriptional errors that result from this process are unintentional.

## 2017-09-02 ENCOUNTER — Emergency Department: Payer: Medicare Other

## 2017-09-02 ENCOUNTER — Emergency Department
Admission: EM | Admit: 2017-09-02 | Discharge: 2017-09-02 | Disposition: A | Discharge status: Home / Self Care | Payer: Medicare Other | Attending: Emergency Medicine | Admitting: Emergency Medicine

## 2017-09-02 ENCOUNTER — Other Ambulatory Visit: Payer: Self-pay

## 2017-09-02 DIAGNOSIS — R42 Dizziness and giddiness: Secondary | ICD-10-CM | POA: Diagnosis present

## 2017-09-02 DIAGNOSIS — Z87891 Personal history of nicotine dependence: Secondary | ICD-10-CM | POA: Insufficient documentation

## 2017-09-02 DIAGNOSIS — N390 Urinary tract infection, site not specified: Secondary | ICD-10-CM | POA: Diagnosis not present

## 2017-09-02 DIAGNOSIS — Z79899 Other long term (current) drug therapy: Secondary | ICD-10-CM | POA: Diagnosis not present

## 2017-09-02 DIAGNOSIS — I10 Essential (primary) hypertension: Secondary | ICD-10-CM | POA: Diagnosis not present

## 2017-09-02 LAB — URINALYSIS, COMPLETE (UACMP) WITH MICROSCOPIC
BILIRUBIN URINE: NEGATIVE
Glucose, UA: NEGATIVE mg/dL
HGB URINE DIPSTICK: NEGATIVE
Ketones, ur: NEGATIVE mg/dL
NITRITE: NEGATIVE
PROTEIN: NEGATIVE mg/dL
Specific Gravity, Urine: 1.005 (ref 1.005–1.030)
pH: 7 (ref 5.0–8.0)

## 2017-09-02 LAB — BASIC METABOLIC PANEL
ANION GAP: 7 (ref 5–15)
BUN: 17 mg/dL (ref 8–23)
CO2: 26 mmol/L (ref 22–32)
Calcium: 8.7 mg/dL — ABNORMAL LOW (ref 8.9–10.3)
Chloride: 108 mmol/L (ref 98–111)
Creatinine, Ser: 0.85 mg/dL (ref 0.44–1.00)
GFR calc Af Amer: >60 mL/min (ref >60)
GFR calc non Af Amer: 59 mL/min — ABNORMAL LOW (ref >60)
GLUCOSE: 116 mg/dL — AB (ref 70–99)
POTASSIUM: 3.7 mmol/L (ref 3.5–5.1)
Sodium: 141 mmol/L (ref 135–145)

## 2017-09-02 LAB — CBC
HCT: 38.2 % (ref 35.0–47.0)
Hemoglobin: 13.4 g/dL (ref 12.0–16.0)
MCH: 31.8 pg (ref 26.0–34.0)
MCHC: 35 g/dL (ref 32.0–36.0)
MCV: 90.9 fL (ref 80.0–100.0)
Platelets: 205 K/uL (ref 150–440)
RBC: 4.2 MIL/uL (ref 3.80–5.20)
RDW: 13.5 % (ref 11.5–14.5)
WBC: 6 K/uL (ref 3.6–11.0)

## 2017-09-02 LAB — TROPONIN I

## 2017-09-02 MED ORDER — SODIUM CHLORIDE 0.9 % IV SOLN
1.0000 g | Freq: Once | INTRAVENOUS | Status: AC
  Administered 2017-09-02: 1 g via INTRAVENOUS
  Filled 2017-09-02: qty 10

## 2017-09-02 MED ORDER — CEPHALEXIN 500 MG PO CAPS: 500.0000 mg | ORAL_CAPSULE | Freq: Two times a day (BID) | ORAL | 0 refills | Status: DC

## 2017-09-02 MED ORDER — SODIUM CHLORIDE 0.9 % IV BOLUS
1000.0000 mL | Freq: Once | INTRAVENOUS | Status: AC
  Administered 2017-09-02: 1000 mL via INTRAVENOUS

## 2017-09-02 NOTE — ED Provider Notes (Signed)
[facility] Emergency Department Provider Note  Time seen: 4:45 PM  I have reviewed the triage vital signs and the nursing notes.   HISTORY  Chief Complaint Dizziness    HPI Deborah Martinez is a 84 y.o. female with a past medical history of anxiety, depression, hypertension, hyperlipidemia, presents to the emergency department for dizziness.  According to the patient she has intermittently been getting dizzy, somewhat worse when she is sitting up or standing up.  Denies any dizziness currently lying in bed.  Patient denies any headache, weakness, numbness, chest pain, trouble breathing, abdominal pain largely negative review of systems.  Currently appears well.   Past Medical History:  Diagnosis Date  . Anxiety   . Constipation   . Cystocele   . Depression   . Hemorrhoid   . Hypercholesterolemia   . Hypertension   . Incomplete bladder emptying   . Postmenopausal atrophic vaginitis   . Rectocele   . Thyroid disease   . UTI (lower urinary tract infection)   . Vaginal enterocele     Patient Active Problem List   Diagnosis Date Noted  . UTI (urinary tract infection) 02/01/2017  . Chest pain 01/10/2017  . Dizziness 01/10/2017  . Ataxia 01/10/2017  . Cystocele, midline 07/27/2014  . Menopause 07/27/2014  . Vaginal atrophy 07/27/2014  . Incomplete bladder emptying 07/27/2014  . Anxiety and depression 07/27/2014  . Constipation 07/27/2014  . Hypertension 07/27/2014  . Hypercholesterolemia 07/27/2014  . Hemorrhoids 07/27/2014  . Rectocele 07/27/2014  . Vaginal enterocele 07/27/2014    Past Surgical History:  Procedure Laterality Date  . APPENDECTOMY    . EYE SURGERY  2012   remve cataract  . TOTAL ABDOMINAL HYSTERECTOMY  1977    Prior to Admission medications   Medication Sig Start Date End Date Taking? Authorizing Provider  alendronate (FOSAMAX) 70 MG tablet Take 70 mg by mouth once a week. Take with a full glass of water on an empty stomach.     [provider]  amLODipine (NORVASC) 2.5 MG tablet Take 1 tablet (2.5 mg total) by mouth daily. 02/03/17   Katha HammingKonidena, Snehalatha, MD  aspirin 81 MG tablet Take 81 mg by mouth daily.    [provider]  atorvastatin (LIPITOR) 10 MG tablet Take 10 mg by mouth. 11/25/11   [provider]  citalopram (CELEXA) 40 MG tablet Take 40 mg by mouth daily.    [provider]  levothyroxine (SYNTHROID, LEVOTHROID) 50 MCG tablet Take 50 mcg by mouth every morning.     [provider]  losartan (COZAAR) 25 MG tablet Take 1 tablet (25 mg total) by mouth daily. 02/03/17   Katha HammingKonidena, Snehalatha, MD  meclizine (ANTIVERT) 12.5 MG tablet Take 1 tablet (12.5 mg total) by mouth 3 (three) times daily as needed for dizziness. 02/03/17   Katha HammingKonidena, Snehalatha, MD  meloxicam (MOBIC) 15 MG tablet Take 15 mg by mouth daily.    [provider]  pregabalin (LYRICA) 50 MG capsule Take 50 mg by mouth 3 (three) times daily.    [provider]  TRIMO-SAN 0.025-0.01 % GEL PLACE 0.25 G VAGINALLY ONCE A WEEK. 07/07/17   Defrancesco, Prentice DockerMartin A, MD  valACYclovir (VALTREX) 1000 MG tablet Take 1 tablet by mouth daily as directed    [provider]  venlafaxine XR (EFFEXOR-XR) 150 MG 24 hr capsule venlafaxine ER 37.5 mg capsule,extended release 24 hr  Start 1 tablet po q day for 1 week then 2 tablets po q  day x1 week and continue titrating to 150 mg po q day    [provider]    No Known Allergies  Family History  Problem Relation Age of Onset  . Heart attack Father   . Diabetes Brother   . Cancer Neg Hx     Social History Social History   Tobacco Use  . Smoking status: Former Games developer  . Smokeless tobacco: Never Used  Substance Use Topics  . Alcohol use: No  . Drug use: No    Review of Systems Constitutional: Negative for fever.  Positive for intermittent dizziness. Cardiovascular: Negative for chest pain. Respiratory: Negative for shortness of  breath. Gastrointestinal: Negative for abdominal pain, vomiting and diarrhea. Genitourinary: Negative for urinary compaints Musculoskeletal: Negative for musculoskeletal complaints Skin: Negative for skin complaints  Neurological: Negative for headache All other ROS negative  ____________________________________________   PHYSICAL EXAM:  VITAL SIGNS: ED Triage Vitals  Enc Vitals Group     BP 09/02/17 1644 (!) 191/117     Pulse Rate 09/02/17 1644 76     Resp 09/02/17 1644 (!) 22     Temp 09/02/17 1644 98.4 F (36.9 C)     Temp Source 09/02/17 1644 Oral     SpO2 09/02/17 1644 98 %     Weight 09/02/17 1639 145 lb (65.8 kg)     Height 09/02/17 1639 5\' 6"  (1.676 m)     Head Circumference --      Peak Flow --      Pain Score 09/02/17 1639 0     Pain Loc --      Pain Edu? --      Excl. in GC? --    Constitutional: Alert and oriented. Well appearing and in no distress. Eyes: Normal exam ENT   Head: Normocephalic and atraumatic.   Mouth/Throat: Mucous membranes are moist. Cardiovascular: Normal rate, regular rhythm. Respiratory: Normal respiratory effort without tachypnea nor retractions. Breath sounds are clear  Gastrointestinal: Soft and nontender. No distention.   Musculoskeletal: Nontender with normal range of motion in all extremities.  Neurologic:  Normal speech and language. No gross focal neurologic deficits  Skin:  Skin is warm, dry and intact.  Psychiatric: Mood and affect are normal.  ____________________________________________    EKG  EKG reviewed and interpreted by myself shows normal sinus rhythm at 77 bpm with a slightly widened QRS, normal axis, normal intervals, nonspecific ST changes without ST elevation.  ____________________________________________    RADIOLOGY  CT scan of the head shows no acute abnormality  ____________________________________________   INITIAL IMPRESSION / ASSESSMENT AND PLAN / ED COURSE  Pertinent labs & imaging  results that were available during my care of the patient were reviewed by me and considered in my medical decision making (see chart for details).  Patient presents to the emergency department for intermittent dizziness, states is mostly when she sits up or stands up.  Denies any weakness or numbness confusion or slurred speech.  Largely negative review of systems.  Overall the patient appears very well she is alert and oriented she has good strength in all extremities no gross deficits on examination.  Differential would include metabolic or electrolyte abnormality, dehydration, UTI, CVA.  We will check labs, urinalysis, IV hydrate, we will also obtain CT imaging of the brain as a precaution.  I discussed this plan of care with the patient who is agreeable.  Since urinalysis is positive for urinary tract infection dosed IV Rocephin.  Remainder the patient's work-up is  nonrevealing.  Overall the patient appears very well.  She is ambulated in the emergency department without difficulty we will discharge on Keflex and have the patient follow-up with her doctor. ____________________________________________   FINAL CLINICAL IMPRESSION(S) / ED DIAGNOSES  Dizziness Urinary tract infection   Minna AntisPaduchowski, Zeddie Njie, MD 09/02/17 1932

## 2017-09-02 NOTE — ED Notes (Signed)
Pt ambulatory to toilet and back.  

## 2017-09-02 NOTE — ED Triage Notes (Signed)
Pt arrives to ED via [facility] from home for dizziness that began this AM and has gotten worse through out the day. Pt was not orthostatic for EMS but became more dizzy when sitting and standing up. Husband told EMS that pt has short term memory loss, hx HTN. EMS reports decreased PO intake over past few days. Pt is alert and oriented upon arrival.

## 2017-09-02 NOTE — ED Notes (Signed)
Pt taken to CT via stretcher.

## 2017-09-02 NOTE — ED Notes (Signed)
Pt ambulatory to toilet with 1 assist. States feels woozy but steady gait noted.

## 2017-09-02 NOTE — ED Notes (Signed)
Pt ambulatory to toilet

## 2017-09-05 LAB — URINE CULTURE: Culture: >=100000 — AB

## 2017-11-03 ENCOUNTER — Telehealth: Payer: Self-pay | Admitting: Obstetrics and Gynecology

## 2017-11-03 NOTE — Telephone Encounter (Signed)
Bill pts husband aware appt is at 1pm. Advised him to have pt sign DPR for Bill to have info.

## 2017-11-03 NOTE — Telephone Encounter (Signed)
The patients son called and stated that he received a message on the patient and would like a call back for clarification if possible today. The patients son would like a call back on the con tact number [phone], No other information was disclosed at the time. Please advise.

## 2017-11-03 NOTE — Telephone Encounter (Signed)
[facility] on sons cell.

## 2017-11-05 ENCOUNTER — Encounter: Payer: Medicare Other | Admitting: Obstetrics and Gynecology

## 2018-11-19 ENCOUNTER — Other Ambulatory Visit: Payer: Self-pay

## 2018-11-19 ENCOUNTER — Emergency Department: Payer: Medicare Other

## 2018-11-19 ENCOUNTER — Inpatient Hospital Stay
Admission: EM | Admit: 2018-11-19 | Discharge: 2018-11-23 | DRG: 481 | Disposition: A | Discharge status: Skilled Nursing Facility | Payer: Medicare Other | Attending: Internal Medicine | Admitting: Internal Medicine

## 2018-11-19 DIAGNOSIS — D72829 Elevated white blood cell count, unspecified: Secondary | ICD-10-CM | POA: Diagnosis not present

## 2018-11-19 DIAGNOSIS — F418 Other specified anxiety disorders: Secondary | ICD-10-CM | POA: Diagnosis present

## 2018-11-19 DIAGNOSIS — W010XXA Fall on same level from slipping, tripping and stumbling without subsequent striking against object, initial encounter: Secondary | ICD-10-CM | POA: Diagnosis present

## 2018-11-19 DIAGNOSIS — Z7901 Long term (current) use of anticoagulants: Secondary | ICD-10-CM

## 2018-11-19 DIAGNOSIS — Z7982 Long term (current) use of aspirin: Secondary | ICD-10-CM

## 2018-11-19 DIAGNOSIS — Z87891 Personal history of nicotine dependence: Secondary | ICD-10-CM

## 2018-11-19 DIAGNOSIS — I10 Essential (primary) hypertension: Secondary | ICD-10-CM | POA: Diagnosis not present

## 2018-11-19 DIAGNOSIS — Z90710 Acquired absence of both cervix and uterus: Secondary | ICD-10-CM | POA: Diagnosis not present

## 2018-11-19 DIAGNOSIS — E039 Hypothyroidism, unspecified: Secondary | ICD-10-CM | POA: Diagnosis present

## 2018-11-19 DIAGNOSIS — Z79899 Other long term (current) drug therapy: Secondary | ICD-10-CM

## 2018-11-19 DIAGNOSIS — R2681 Unsteadiness on feet: Secondary | ICD-10-CM | POA: Diagnosis present

## 2018-11-19 DIAGNOSIS — S72002A Fracture of unspecified part of neck of left femur, initial encounter for closed fracture: Secondary | ICD-10-CM

## 2018-11-19 DIAGNOSIS — I119 Hypertensive heart disease without heart failure: Secondary | ICD-10-CM | POA: Diagnosis present

## 2018-11-19 DIAGNOSIS — E46 Unspecified protein-calorie malnutrition: Secondary | ICD-10-CM | POA: Diagnosis not present

## 2018-11-19 DIAGNOSIS — D62 Acute posthemorrhagic anemia: Secondary | ICD-10-CM | POA: Diagnosis not present

## 2018-11-19 DIAGNOSIS — F329 Major depressive disorder, single episode, unspecified: Secondary | ICD-10-CM

## 2018-11-19 DIAGNOSIS — E785 Hyperlipidemia, unspecified: Secondary | ICD-10-CM | POA: Diagnosis present

## 2018-11-19 DIAGNOSIS — F419 Anxiety disorder, unspecified: Secondary | ICD-10-CM | POA: Diagnosis present

## 2018-11-19 DIAGNOSIS — R42 Dizziness and giddiness: Secondary | ICD-10-CM

## 2018-11-19 DIAGNOSIS — F0390 Unspecified dementia without behavioral disturbance: Secondary | ICD-10-CM | POA: Diagnosis present

## 2018-11-19 DIAGNOSIS — S72145A Nondisplaced intertrochanteric fracture of left femur, initial encounter for closed fracture: Secondary | ICD-10-CM | POA: Diagnosis not present

## 2018-11-19 DIAGNOSIS — S7292XA Unspecified fracture of left femur, initial encounter for closed fracture: Secondary | ICD-10-CM | POA: Diagnosis present

## 2018-11-19 DIAGNOSIS — S72142A Displaced intertrochanteric fracture of left femur, initial encounter for closed fracture: Principal | ICD-10-CM | POA: Diagnosis present

## 2018-11-19 DIAGNOSIS — I480 Paroxysmal atrial fibrillation: Secondary | ICD-10-CM | POA: Diagnosis present

## 2018-11-19 DIAGNOSIS — Z79890 Hormone replacement therapy (postmenopausal): Secondary | ICD-10-CM | POA: Diagnosis not present

## 2018-11-19 DIAGNOSIS — W19XXXA Unspecified fall, initial encounter: Secondary | ICD-10-CM

## 2018-11-19 DIAGNOSIS — Z8249 Family history of ischemic heart disease and other diseases of the circulatory system: Secondary | ICD-10-CM | POA: Diagnosis not present

## 2018-11-19 DIAGNOSIS — Z20828 Contact with and (suspected) exposure to other viral communicable diseases: Secondary | ICD-10-CM | POA: Diagnosis present

## 2018-11-19 LAB — CBC WITH DIFFERENTIAL/PLATELET
Abs Immature Granulocytes: 0.14 10*3/uL — ABNORMAL HIGH (ref 0.00–0.07)
Basophils Absolute: 0 10*3/uL (ref 0.0–0.1)
Basophils Relative: 1 %
Eosinophils Absolute: 0.2 10*3/uL (ref 0.0–0.5)
Eosinophils Relative: 2 %
HCT: 34.9 % — ABNORMAL LOW (ref 36.0–46.0)
Hemoglobin: 12.1 g/dL (ref 12.0–15.0)
Immature Granulocytes: 2 %
Lymphocytes Relative: 14 %
Lymphs Abs: 1 10*3/uL (ref 0.7–4.0)
MCH: 30.6 pg (ref 26.0–34.0)
MCHC: 34.7 g/dL (ref 30.0–36.0)
MCV: 88.4 fL (ref 80.0–100.0)
Monocytes Absolute: 1 10*3/uL (ref 0.1–1.0)
Monocytes Relative: 13 %
Neutro Abs: 5.2 10*3/uL (ref 1.7–7.7)
Neutrophils Relative %: 68 %
Platelets: 330 10*3/uL (ref 150–400)
RBC: 3.95 MIL/uL (ref 3.87–5.11)
RDW: 12.8 % (ref 11.5–15.5)
WBC: 7.6 10*3/uL (ref 4.0–10.5)
nRBC: 0 % (ref 0.0–0.2)

## 2018-11-19 LAB — BASIC METABOLIC PANEL
Anion gap: 11 (ref 5–15)
BUN: 17 mg/dL (ref 8–23)
CO2: 24 mmol/L (ref 22–32)
Calcium: 8.9 mg/dL (ref 8.9–10.3)
Chloride: 101 mmol/L (ref 98–111)
Creatinine, Ser: 0.9 mg/dL (ref 0.44–1.00)
GFR calc Af Amer: >60 mL/min (ref >60)
GFR calc non Af Amer: 57 mL/min — ABNORMAL LOW (ref >60)
Glucose, Bld: 116 mg/dL — ABNORMAL HIGH (ref 70–99)
Potassium: 4.2 mmol/L (ref 3.5–5.1)
Sodium: 136 mmol/L (ref 135–145)

## 2018-11-19 LAB — PROTIME-INR
INR: 1.5 — ABNORMAL HIGH (ref 0.8–1.2)
Prothrombin Time: 17.9 seconds — ABNORMAL HIGH (ref 11.4–15.2)

## 2018-11-19 MED ORDER — HYDROCODONE-ACETAMINOPHEN 5-325 MG PO TABS
1.0000 | ORAL_TABLET | Freq: Four times a day (QID) | ORAL | Status: DC | PRN
  Administered 2018-11-20: 1 via ORAL
  Filled 2018-11-19: qty 1

## 2018-11-19 MED ORDER — SENNOSIDES-DOCUSATE SODIUM 8.6-50 MG PO TABS: 1.0000 | ORAL_TABLET | Freq: Every evening | ORAL | Status: DC | PRN

## 2018-11-19 MED ORDER — ALUM & MAG HYDROXIDE-SIMETH 200-200-20 MG/5ML PO SUSP
30.0000 mL | Freq: Once | ORAL | Status: AC
  Administered 2018-11-19: 30 mL via ORAL
  Filled 2018-11-19: qty 30

## 2018-11-19 MED ORDER — MORPHINE SULFATE (PF) 2 MG/ML IV SOLN
0.5000 mg | INTRAVENOUS | Status: DC | PRN
  Administered 2018-11-19 (×2): 0.5 mg via INTRAVENOUS
  Filled 2018-11-19: qty 1

## 2018-11-19 MED ORDER — CEFAZOLIN SODIUM-DEXTROSE 2-4 GM/100ML-% IV SOLN
2.0000 g | Freq: Once | INTRAVENOUS | Status: AC
  Administered 2018-11-20: 11:00:00 2 g via INTRAVENOUS
  Filled 2018-11-19: qty 100

## 2018-11-19 MED ORDER — LIDOCAINE VISCOUS HCL 2 % MT SOLN
15.0000 mL | Freq: Once | OROMUCOSAL | Status: AC
  Administered 2018-11-19: 15 mL via ORAL
  Filled 2018-11-19: qty 15

## 2018-11-19 MED ORDER — MORPHINE SULFATE (PF) 2 MG/ML IV SOLN
2.0000 mg | Freq: Once | INTRAVENOUS | Status: AC
  Administered 2018-11-19: 2 mg via INTRAVENOUS
  Filled 2018-11-19: qty 1

## 2018-11-19 NOTE — Consult Note (Signed)
ORTHOPAEDIC CONSULTATION  REQUESTING PHYSICIAN: Phineas SemenGoodman, Graydon, MD  Chief Complaint:   Left hip pain.  History of Present Illness: Deborah Martinez is a 85 y.o. female with a history of hypertension, hypercholesterolemia, anxiety/depression, and intermittent dizziness who was in her usual state of good health today.  She normally lives independently with her husband at an assisted living facility.  She was with her son this afternoon in her home.  As she went back to her bedroom, she lost her balance upon entering the bedroom and fell onto her left side, injuring her left hip.  The patient denies any associated injury.  She did not strike her head or lose consciousness.  She also does not believe that she suffered any lightheadedness, dizziness, chest pain, shortness of breath, or other symptoms which may have precipitated her fall.  Past Medical History:  Diagnosis Date  . Anxiety   . Constipation   . Cystocele   . Depression   . Hemorrhoid   . Hypercholesterolemia   . Hypertension   . Incomplete bladder emptying   . Postmenopausal atrophic vaginitis   . Rectocele   . Thyroid disease   . UTI (lower urinary tract infection)   . Vaginal enterocele    Past Surgical History:  Procedure Laterality Date  . APPENDECTOMY    . EYE SURGERY  2012   remve cataract  . TOTAL ABDOMINAL HYSTERECTOMY  1977   Social History   Socioeconomic History  . Marital status: Married    Spouse name: Not on file  . Number of children: Not on file  . Years of education: Not on file  . Highest education level: Not on file  Occupational History  . Not on file  Social Needs  . Financial resource strain: Not on file  . Food insecurity    Worry: Not on file    Inability: Not on file  . Transportation needs    Medical: Not on file    Non-medical: Not on file  Tobacco Use  . Smoking status: Former Games developermoker  . Smokeless tobacco: Never Used   Substance and Sexual Activity  . Alcohol use: No  . Drug use: No  . Sexual activity: Not Currently  Lifestyle  . Physical activity    Days per week: Not on file    Minutes per session: Not on file  . Stress: Not on file  Relationships  . Social Musicianconnections    Talks on phone: Not on file    Gets together: Not on file    Attends religious service: Not on file    Active member of club or organization: Not on file    Attends meetings of clubs or organizations: Not on file    Relationship status: Not on file  Other Topics Concern  . Not on file  Social History Narrative  . Not on file   Family History  Problem Relation Age of Onset  . Heart attack Father   . Diabetes Brother   . Cancer Neg Hx    No Known Allergies Prior to Admission medications   Medication Sig Start Date End Date Taking? Authorizing Provider  alendronate (FOSAMAX) 70 MG tablet Take 70 mg by mouth once a week. Take with a full glass of water on an empty stomach.    [provider]  amLODipine (NORVASC) 2.5 MG tablet Take 1 tablet (2.5 mg total) by mouth daily. 02/03/17   Katha HammingKonidena, Snehalatha, MD  aspirin 81 MG tablet Take 81 mg by mouth  daily.    [provider]  atorvastatin (LIPITOR) 10 MG tablet Take 10 mg by mouth. 11/25/11   [provider]  cephALEXin (KEFLEX) 500 MG capsule Take 1 capsule (500 mg total) by mouth 2 (two) times daily. 09/02/17   Minna Antis, MD  citalopram (CELEXA) 40 MG tablet Take 40 mg by mouth daily.    [provider]  levothyroxine (SYNTHROID, LEVOTHROID) 50 MCG tablet Take 50 mcg by mouth every morning.     [provider]  losartan (COZAAR) 25 MG tablet Take 1 tablet (25 mg total) by mouth daily. 02/03/17   Katha Hamming, MD  meclizine (ANTIVERT) 12.5 MG tablet Take 1 tablet (12.5 mg total) by mouth 3 (three) times daily as needed for dizziness. 02/03/17   Katha Hamming, MD  meloxicam (MOBIC) 15 MG tablet Take 15 mg by  mouth daily.    [provider]  pregabalin (LYRICA) 50 MG capsule Take 50 mg by mouth 3 (three) times daily.    [provider]  TRIMO-SAN 0.025-0.01 % GEL PLACE 0.25 G VAGINALLY ONCE A WEEK. 07/07/17   Defrancesco, Prentice Docker, MD  valACYclovir (VALTREX) 1000 MG tablet Take 1 tablet by mouth daily as directed    [provider]  venlafaxine XR (EFFEXOR-XR) 150 MG 24 hr capsule venlafaxine ER 37.5 mg capsule,extended release 24 hr  Start 1 tablet po q day for 1 week then 2 tablets po q day x1 week and continue titrating to 150 mg po q day    [provider]   Dg Chest 1 View  Result Date: 11/19/2018 CLINICAL DATA:  Fall EXAM: CHEST  1 VIEW COMPARISON:  12/30/2016 FINDINGS: Lungs are clear.  No pleural effusion or pneumothorax. Cardiomegaly.  Thoracic aortic atherosclerosis. IMPRESSION: No evidence of acute cardiopulmonary disease. Thoracic aortic atherosclerosis. Electronically Signed   By: Charline Bills M.D.   On: 11/19/2018 18:13   Ct Head Wo Contrast  Result Date: 11/19/2018 CLINICAL DATA:  Fall with hip deformity EXAM: CT HEAD WITHOUT CONTRAST CT CERVICAL SPINE WITHOUT CONTRAST TECHNIQUE: Multidetector CT imaging of the head and cervical spine was performed following the standard protocol without intravenous contrast. Multiplanar CT image reconstructions of the cervical spine were also generated. COMPARISON:  CT brain 09/02/2017, 01/31/2017, 01/10/2017 FINDINGS: CT HEAD FINDINGS Brain: No acute territorial infarction, hemorrhage or intracranial mass. Moderate periventricular, subcortical and deep white matter hypodensity consistent with chronic small vessel ischemic changes. Stable ventricle size. Vascular: No hyperdense vessels. Densely calcified cavernous carotid arteries. 11 mm soft tissue density with peripheral calcification at the right cavernous sinus. Skull: No fracture.  Mastoid sclerosis. Sinuses/Orbits: Mucosal thickening and cysts in the maxillary,  sphenoid and ethmoid sinuses Other: None CT CERVICAL SPINE FINDINGS Alignment: Reversal of cervical lordosis. Trace anterolisthesis C2 on C3, C3 on C4 and C4 on C5. Trace retrolisthesis C6 on C7. Facet alignment within normal limits. Skull base and vertebrae: No acute fracture. No primary bone lesion or focal pathologic process. Soft tissues and spinal canal: No prevertebral fluid or swelling. No visible canal hematoma. Disc levels: Mild degenerative changes at C3-C4 and C4-C5 with advanced degenerative change at C5-C6 and C6-C7. Multiple level facet degenerative change. Upper chest: Negative. Other: None IMPRESSION: 1. No CT evidence for acute intracranial abnormality. Atrophy and small vessel ischemic changes of the white matter. 2. Reversal of cervical lordosis with trace anterolisthesis C2 on C3, C3 on C4 and C4 on C5, probably degenerative. No fracture is seen. If ligamentous injury is a concern,  MRI could be obtained. 3. 11 mm soft tissue density with peripheral calcification at the right cavernous sinus, possibly representing an aneurysm. This could be further evaluated with CT brain angiography. Electronically Signed   By: Donavan Foil M.D.   On: 11/19/2018 17:39   Ct Cervical Spine Wo Contrast  Result Date: 11/19/2018 CLINICAL DATA:  Fall with hip deformity EXAM: CT HEAD WITHOUT CONTRAST CT CERVICAL SPINE WITHOUT CONTRAST TECHNIQUE: Multidetector CT imaging of the head and cervical spine was performed following the standard protocol without intravenous contrast. Multiplanar CT image reconstructions of the cervical spine were also generated. COMPARISON:  CT brain 09/02/2017, 01/31/2017, 01/10/2017 FINDINGS: CT HEAD FINDINGS Brain: No acute territorial infarction, hemorrhage or intracranial mass. Moderate periventricular, subcortical and deep white matter hypodensity consistent with chronic small vessel ischemic changes. Stable ventricle size. Vascular: No hyperdense vessels. Densely calcified cavernous  carotid arteries. 11 mm soft tissue density with peripheral calcification at the right cavernous sinus. Skull: No fracture.  Mastoid sclerosis. Sinuses/Orbits: Mucosal thickening and cysts in the maxillary, sphenoid and ethmoid sinuses Other: None CT CERVICAL SPINE FINDINGS Alignment: Reversal of cervical lordosis. Trace anterolisthesis C2 on C3, C3 on C4 and C4 on C5. Trace retrolisthesis C6 on C7. Facet alignment within normal limits. Skull base and vertebrae: No acute fracture. No primary bone lesion or focal pathologic process. Soft tissues and spinal canal: No prevertebral fluid or swelling. No visible canal hematoma. Disc levels: Mild degenerative changes at C3-C4 and C4-C5 with advanced degenerative change at C5-C6 and C6-C7. Multiple level facet degenerative change. Upper chest: Negative. Other: None IMPRESSION: 1. No CT evidence for acute intracranial abnormality. Atrophy and small vessel ischemic changes of the white matter. 2. Reversal of cervical lordosis with trace anterolisthesis C2 on C3, C3 on C4 and C4 on C5, probably degenerative. No fracture is seen. If ligamentous injury is a concern, MRI could be obtained. 3. 11 mm soft tissue density with peripheral calcification at the right cavernous sinus, possibly representing an aneurysm. This could be further evaluated with CT brain angiography. Electronically Signed   By: Donavan Foil M.D.   On: 11/19/2018 17:39   Dg Hip Unilat W Or Wo Pelvis 2-3 Views Left  Result Date: 11/19/2018 CLINICAL DATA:  Fall EXAM: DG HIP (WITH OR WITHOUT PELVIS) 2-3V LEFT COMPARISON:  None. FINDINGS: Intertrochanteric left hip fracture with foreshortening and varus angulation. Mild degenerative changes of the bilateral hips. Visualized bony pelvis appears intact. Degenerative changes of the lower lumbar spine. IMPRESSION: Intertrochanteric left hip fracture, as above. Electronically Signed   By: Julian Hy M.D.   On: 11/19/2018 18:14    Positive ROS: All other  systems have been reviewed and were otherwise negative with the exception of those mentioned in the HPI and as above.  Physical Exam: General:  Alert, no acute distress Psychiatric:  Patient is competent for consent with normal mood and affect   Cardiovascular:  No pedal edema Respiratory:  No wheezing, non-labored breathing GI:  Abdomen is soft and non-tender Skin:  No lesions in the area of chief complaint Neurologic:  Sensation intact distally Lymphatic:  No axillary or cervical lymphadenopathy  Orthopedic Exam:  Orthopedic examination is limited to the left hip and lower extremity.  The left lower extremity somewhat shortened and externally rotated as compared to the right.  Skin inspection around the left hip is unremarkable.  No swelling, erythema, ecchymosis, abrasions, or other skin abnormalities are identified.  She has mild tenderness to palpation of the lateral aspect  of the left hip.  She has more severe pain with any attempted active or passive motion of the left hip or lower extremity.  She is able to dorsiflex and plantarflex her toes and ankle.  Sensation is intact light touch to all distributions.  She has good capillary refill to her left foot.  X-rays:  X-rays of the pelvis and left hip are available for review and have been reviewed by myself.  These films demonstrate a displaced intertrochanteric fracture of the left hip.  No significant degenerative changes of the hip joint are noted.  No lytic lesions or other acute bony abnormalities are identified.  Assessment: Displaced intertrochanteric left hip fracture.  Plan: The treatment options have been discussed with the patient and her son, who is at the bedside, including both surgical and nonsurgical choices.  The patient and her son would like to proceed with surgical intervention to include an intramedullary nailing of the displaced intertrochanteric left hip fracture.  This procedure has been discussed in detail, as have  the potential risks (including bleeding, infection, nerve and/or blood vessel injury, persistent or recurrent pain, stiffness, malunion and/or nonunion, leg length inequality, need for further surgery, blood clots, strokes, heart attacks and/or arrhythmias, etc.) and benefits.  The patient and her son state their understanding and wished to proceed.  A formal written consent has been obtained.  Thank you for asking me to participate in the care of this most pleasant woman.  I will be happy to follow her with you.   Maryagnes Amos, MD  Beeper #:  [phone]  11/19/2018 6:45 PM

## 2018-11-19 NOTE — ED Notes (Signed)
[place] pt's son

## 2018-11-19 NOTE — ED Provider Notes (Signed)
[facility] Emergency Department Provider Note    ____________________________________________   I have reviewed the triage vital signs and the nursing notes.   HISTORY  Chief Complaint Left hip pain  History limited by: Not Limited   HPI Deborah Martinez is a 85 y.o. female who presents to the emergency department today via EMS with primary complaint of left hip pain.  The patient did have a fall.  She however is unable to give any history about when she fell or how she fell.  She is not sure if she tripped.  She is complaining of severe pain to her left hip.  It is worse with movement.  She says it does radiate to her lower back. She denies any other pain or injury. Denies any recent illness or fever.    Records reviewed. Per medical record review patient has a history of HTN, HLD.   Past Medical History:  Diagnosis Date  . Anxiety   . Constipation   . Cystocele   . Depression   . Hemorrhoid   . Hypercholesterolemia   . Hypertension   . Incomplete bladder emptying   . Postmenopausal atrophic vaginitis   . Rectocele   . Thyroid disease   . UTI (lower urinary tract infection)   . Vaginal enterocele     Patient Active Problem List   Diagnosis Date Noted  . UTI (urinary tract infection) 02/01/2017  . Chest pain 01/10/2017  . Dizziness 01/10/2017  . Ataxia 01/10/2017  . Cystocele, midline 07/27/2014  . Menopause 07/27/2014  . Vaginal atrophy 07/27/2014  . Incomplete bladder emptying 07/27/2014  . Anxiety and depression 07/27/2014  . Constipation 07/27/2014  . Hypertension 07/27/2014  . Hypercholesterolemia 07/27/2014  . Hemorrhoids 07/27/2014  . Rectocele 07/27/2014  . Vaginal enterocele 07/27/2014    Past Surgical History:  Procedure Laterality Date  . APPENDECTOMY    . EYE SURGERY  2012   remve cataract  . TOTAL ABDOMINAL HYSTERECTOMY  1977    Prior to Admission medications   Medication Sig Start Date End Date Taking? Authorizing  Provider  alendronate (FOSAMAX) 70 MG tablet Take 70 mg by mouth once a week. Take with a full glass of water on an empty stomach.    [provider]  amLODipine (NORVASC) 2.5 MG tablet Take 1 tablet (2.5 mg total) by mouth daily. 02/03/17   Katha Hamming, MD  aspirin 81 MG tablet Take 81 mg by mouth daily.    [provider]  atorvastatin (LIPITOR) 10 MG tablet Take 10 mg by mouth. 11/25/11   [provider]  cephALEXin (KEFLEX) 500 MG capsule Take 1 capsule (500 mg total) by mouth 2 (two) times daily. 09/02/17   Minna Antis, MD  citalopram (CELEXA) 40 MG tablet Take 40 mg by mouth daily.    [provider]  levothyroxine (SYNTHROID, LEVOTHROID) 50 MCG tablet Take 50 mcg by mouth every morning.     [provider]  losartan (COZAAR) 25 MG tablet Take 1 tablet (25 mg total) by mouth daily. 02/03/17   Katha Hamming, MD  meclizine (ANTIVERT) 12.5 MG tablet Take 1 tablet (12.5 mg total) by mouth 3 (three) times daily as needed for dizziness. 02/03/17   Katha Hamming, MD  meloxicam (MOBIC) 15 MG tablet Take 15 mg by mouth daily.    [provider]  pregabalin (LYRICA) 50 MG capsule Take 50 mg by mouth 3 (three) times daily.    [provider]  TRIMO-SAN 0.025-0.01 %  GEL PLACE 0.25 G VAGINALLY ONCE A WEEK. 07/07/17   Defrancesco, Prentice DockerMartin A, MD  valACYclovir (VALTREX) 1000 MG tablet Take 1 tablet by mouth daily as directed    [provider]  venlafaxine XR (EFFEXOR-XR) 150 MG 24 hr capsule venlafaxine ER 37.5 mg capsule,extended release 24 hr  Start 1 tablet po q day for 1 week then 2 tablets po q day x1 week and continue titrating to 150 mg po q day    [provider]    Allergies Patient has no known allergies.  Family History  Problem Relation Age of Onset  . Heart attack Father   . Diabetes Brother   . Cancer Neg Hx     Social History Social History   Tobacco Use  . Smoking status:  Former Games developermoker  . Smokeless tobacco: Never Used  Substance Use Topics  . Alcohol use: No  . Drug use: No    Review of Systems Constitutional: No fever/chills Eyes: No visual changes. ENT: No sore throat. Cardiovascular: Denies chest pain. Respiratory: Denies shortness of breath. Gastrointestinal: No abdominal pain.  No nausea, no vomiting.  No diarrhea.   Genitourinary: Negative for dysuria. Musculoskeletal: Positive for left hip pain. Skin: Negative for rash. Neurological: Negative for headaches, focal weakness or numbness.  ____________________________________________   PHYSICAL EXAM:  VITAL SIGNS: ED Triage Vitals  Enc Vitals Group     BP      Pulse      Resp      Temp      Temp src      SpO2      Weight      Height      Head Circumference      Peak Flow      Pain Score      Pain Loc      Pain Edu?      Excl. in GC?      Constitutional: Alert and oriented.  Eyes: Conjunctivae are normal.  ENT      Head: Normocephalic and atraumatic.      Nose: No congestion/rhinnorhea.      Mouth/Throat: Mucous membranes are moist.      Neck: No stridor. Hematological/Lymphatic/Immunilogical: No cervical lymphadenopathy. Cardiovascular: Normal rate, regular rhythm.  No murmurs, rubs, or gallops.  Respiratory: Normal respiratory effort without tachypnea nor retractions. Breath sounds are clear and equal bilaterally. No wheezes/rales/rhonchi. Gastrointestinal: Soft and non tender. No rebound. No guarding.  Genitourinary: Deferred Musculoskeletal: Left leg externally rotated and shortened. Tender to palpitation and manipulation of the left hip. DP 2+ Neurologic:  Normal speech and language. No gross focal neurologic deficits are appreciated.  Skin:  Skin is warm, dry and intact. No rash noted. Psychiatric: Mood and affect are normal. Speech and behavior are normal. Patient exhibits appropriate insight and judgment.  ____________________________________________    LABS  (pertinent positives/negatives)  BMP wnl except glu 116 CBC wbc 7.6, hgb 12.1, plt 330  ____________________________________________   EKG  I, Phineas SemenGraydon Kenard Morawski, attending physician, personally viewed and interpreted this EKG  EKG Time: 1704 Rate: 62 Rhythm: normal sinus rhythm Axis: normal Intervals: qtc 462 QRS: incomplete RBBB ST changes: no st elevation Impression: abnormal ekg   ____________________________________________    RADIOLOGY  CT head/cervical No acute traumatic findings  CXR No acute disease  Left hip Intertrochanteric fracture ____________________________________________   PROCEDURES  Procedures  ____________________________________________   INITIAL IMPRESSION / ASSESSMENT AND PLAN / ED COURSE  Pertinent labs & imaging results that were available  during my care of the patient were reviewed by me and considered in my medical decision making (see chart for details).   Patient presented to the emergency department today because of concerns for left hip pain after a fall.  Physical exam is concerning for left hip fracture.  X-ray did confirm a left intertrochanteric fracture.  Patient was unsure why she fell.  Initial work-up including blood work without any concerning abnormalities.  Discussed with Dr. Roland Rack with orthopedics.  Will discuss with hospitalist for admission.  ____________________________________________   FINAL CLINICAL IMPRESSION(S) / ED DIAGNOSES  Final diagnoses:  Closed fracture of left hip, initial encounter [facility])  Fall, initial encounter     Note: This dictation was prepared with Diplomatic Services operational officer dictation. Any transcriptional errors that result from this process are unintentional     Nance Pear, MD 11/19/18 1829

## 2018-11-19 NOTE — ED Notes (Signed)
Pt reports she needs to "burp" administered medications as ordered by Dr. Archie Balboa. Repositioned pt in bed

## 2018-11-19 NOTE — ED Triage Notes (Signed)
Pt arrives via ems from home. Ems reports pt fell, unsure if mechanical or weakness. Pt left leg laterally rotated and shortened. 50 mcg fentanyl given in route to ed. Pt alert and oriented to self, unsure of month or year, states she is at hospital but unsure of what happened. NAD noted at this time.

## 2018-11-19 NOTE — H&P (Signed)
History and Physical    [name]:[id number] DOB: 06/24/1927 DOA: 11/19/2018  PCP: [name], MD  Patient coming from: Home, Son at bedside  I have personally briefly reviewed patient's old medical records in [facility]  Chief Complaint: Fall  HPI: Deborah Martinez is a 85 y.o. female with medical history significant of paroxysmal atrial fibrillation on Eliquis, hypertension, bilateral carotid artery stenosis, anxiety depression who presents status post a fall.  Son at bedside provides most of the history as patient cannot recall the event.  He notes that patient had went out of his sight at home today and had turned a corner towards her bedroom when he heard her fall. No loss of consciousness.  No object in her path that could have caused the fall. She has unsteady gait and family has to remind her to use a walker frequently. She also has dizzy spells at times. However denies any dizziness. Denies chest pain, palpitation or shortness of breath. No nausea, vomiting or diarrhea. She was otherwise in her normal state of health. Only complaining of lower back pain. Son is concerned about her declining memory and has been considering long-term placement.   ED Course: She was afebrile and hypertensive up to 190s/110 on room air.CBC and BMP unremarkable. CT head and cervical spine were negative for any fracture.  Negative chest x-ray.  Hip x-ray showed intertrochanteric left hip fracture.  Review of Systems:  Constitutional: No Weight Change, No Fever ENT/Mouth: No sore throat, No Rhinorrhea Eyes: No Eye Pain, No Vision Changes Cardiovascular: No Chest Pain, no SOB, No PND, No Dyspnea on Exertion, No Orthopnea, No Claudication, No Edema, No Palpitations Respiratory: No Cough, No Sputum, No Wheezing, no Dyspnea  Gastrointestinal: No Nausea, No Vomiting, No Diarrhea, No Constipation, No Pain Genitourinary: no Urinary Incontinence, No Urgency, No Flank Pain Musculoskeletal: No  Arthralgias, No Myalgias Skin: No Skin Lesions, No Pruritus, Neuro: no Weakness, No Numbness,  No Loss of Consciousness, No Syncope Psych: No Anxiety/Panic, No Depression, no decrease appetite Heme/Lymph: No Bruising, No Bleeding  Past Medical History:  Diagnosis Date   Anxiety    Constipation    Cystocele    Depression    Hemorrhoid    Hypercholesterolemia    Hypertension    Incomplete bladder emptying    Postmenopausal atrophic vaginitis    Rectocele    Thyroid disease    UTI (lower urinary tract infection)    Vaginal enterocele     Past Surgical History:  Procedure Laterality Date   APPENDECTOMY     EYE SURGERY  2012   remve cataract   TOTAL ABDOMINAL HYSTERECTOMY  1977     reports that she has quit smoking. She has never used smokeless tobacco. She reports that she does not drink alcohol or use drugs.  No Known Allergies  Family History  Problem Relation Age of Onset   Heart attack Father    Diabetes Brother    Cancer Neg Hx     Family history reviewed and not pertinent   Prior to Admission medications   Medication Sig Start Date End Date Taking? Authorizing Provider  alendronate (FOSAMAX) 70 MG tablet Take 70 mg by mouth once a week. Take with a full glass of water on an empty stomach.   Yes [provider]  buPROPion (WELLBUTRIN SR) 100 MG 12 hr tablet Take 100 mg by mouth daily. 10/14/18  Yes [provider]  donepezil (ARICEPT) 5 MG tablet Take 5 mg by  mouth at bedtime. 11/18/18  Yes [provider]  ELIQUIS 5 MG TABS tablet Take 5 mg by mouth 2 (two) times daily. 11/18/18  Yes [provider]  levothyroxine (SYNTHROID, LEVOTHROID) 50 MCG tablet Take 50 mcg by mouth every morning.    Yes [provider]  losartan (COZAAR) 50 MG tablet Take 50 mg by mouth 2 (two) times daily. 11/11/18  Yes [provider]  meclizine (ANTIVERT) 12.5 MG tablet Take 1 tablet (12.5 mg total) by mouth 3 (three)  times daily as needed for dizziness. 02/03/17  Yes Katha HammingKonidena, Snehalatha, MD  metoprolol succinate (TOPROL-XL) 25 MG 24 hr tablet Take 25 mg by mouth daily. 10/28/18  Yes [provider]  multivitamin-iron-minerals-folic acid (CENTRUM) chewable tablet Chew 1 tablet by mouth daily.   Yes [provider]  sertraline (ZOLOFT) 100 MG tablet Take 100 mg by mouth daily. 08/20/18  Yes [provider]  amLODipine (NORVASC) 2.5 MG tablet Take 1 tablet (2.5 mg total) by mouth daily. Patient not taking: Reported on 11/19/2018 02/03/17   Katha HammingKonidena, Snehalatha, MD  cephALEXin (KEFLEX) 500 MG capsule Take 1 capsule (500 mg total) by mouth 2 (two) times daily. 09/02/17   Minna AntisPaduchowski, Kevin, MD  TRIMO-SAN 0.025-0.01 % GEL PLACE 0.25 G VAGINALLY ONCE A WEEK. Patient not taking: Reported on 11/19/2018 07/07/17   Defrancesco, Prentice DockerMartin A, MD    Physical Exam: Vitals:   11/19/18 1657 11/19/18 1658 11/19/18 1821  BP: (!) 197/116  (!) 193/111  Pulse: 62  65  Resp: 17  20  Temp: 98.1 F (36.7 C)    TempSrc: Oral    SpO2: 96%    Weight:  61.2 kg   Height:  5\' 6"  (1.676 m)     Constitutional: NAD, calm, comfortable, elderly female laying flat in bed Vitals:   11/19/18 1657 11/19/18 1658 11/19/18 1821  BP: (!) 197/116  (!) 193/111  Pulse: 62  65  Resp: 17  20  Temp: 98.1 F (36.7 C)    TempSrc: Oral    SpO2: 96%    Weight:  61.2 kg   Height:  5\' 6"  (1.676 m)    Eyes: PERRL, lids and conjunctivae normal ENMT: Mucous membranes are moist. Posterior pharynx clear of any exudate or lesions. Neck: normal, supple, no masses Respiratory: clear to auscultation bilaterally, no wheezing, no crackles. Normal respiratory effort. No accessory muscle use.  Cardiovascular: Regular rate and rhythm, no murmurs / rubs / gallops. No extremity edema. 2+ pedal pulses. Abdomen: no tenderness, no masses palpated.  Bowel sounds positive.  Musculoskeletal: no clubbing / cyanosis. No joint deformity upper and  lower extremities. no contractures. Normal muscle tone.  Skin: no rashes, lesions, ulcers. No induration Neurologic: CN 2-12 grossly intact. Sensation intact. Good upper extremity grip strength. Unable to lift left lower extremity due to intolerance of pain from hip fracture.  Able to lift right lower extremity against gravity but could not tolerate resistance due to pain referring to her left hip.   Psychiatric: Normal judgment and insight. Alert and oriented x 3- although not able to recall history around her falling incident. Normal mood.    Labs on Admission: I have personally reviewed following labs and imaging studies  CBC: Recent Labs  Lab 11/19/18 1707  WBC 7.6  NEUTROABS 5.2  HGB 12.1  HCT 34.9*  MCV 88.4  PLT 330   Basic Metabolic Panel: Recent Labs  Lab 11/19/18 1707  NA 136  K 4.2  CL 101  CO2  24  GLUCOSE 116*  BUN 17  CREATININE 0.90  CALCIUM 8.9   GFR: Estimated Creatinine Clearance: 39.7 mL/min (by C-G formula based on SCr of 0.9 mg/dL). Liver Function Tests: No results for input(s): AST, ALT, ALKPHOS, BILITOT, PROT, ALBUMIN in the last 168 hours. No results for input(s): LIPASE, AMYLASE in the last 168 hours. No results for input(s): AMMONIA in the last 168 hours. Coagulation Profile: Recent Labs  Lab 11/19/18 1707  INR 1.5*   Cardiac Enzymes: No results for input(s): CKTOTAL, CKMB, CKMBINDEX, TROPONINI in the last 168 hours. BNP (last 3 results) No results for input(s): PROBNP in the last 8760 hours. HbA1C: No results for input(s): HGBA1C in the last 72 hours. CBG: No results for input(s): GLUCAP in the last 168 hours. Lipid Profile: No results for input(s): CHOL, HDL, LDLCALC, TRIG, CHOLHDL, LDLDIRECT in the last 72 hours. Thyroid Function Tests: No results for input(s): TSH, T4TOTAL, FREET4, T3FREE, THYROIDAB in the last 72 hours. Anemia Panel: No results for input(s): VITAMINB12, FOLATE, FERRITIN, TIBC, IRON, RETICCTPCT in the last 72  hours. Urine analysis:    Component Value Date/Time   COLORURINE YELLOW (A) 09/02/2017 1641   APPEARANCEUR HAZY (A) 09/02/2017 1641   LABSPEC 1.005 09/02/2017 1641   PHURINE 7.0 09/02/2017 1641   GLUCOSEU NEGATIVE 09/02/2017 1641   HGBUR NEGATIVE 09/02/2017 1641   BILIRUBINUR NEGATIVE 09/02/2017 1641   BILIRUBINUR neg 01/22/2017 1445   KETONESUR NEGATIVE 09/02/2017 1641   PROTEINUR NEGATIVE 09/02/2017 1641   UROBILINOGEN 0.2 01/22/2017 1445   NITRITE NEGATIVE 09/02/2017 1641   LEUKOCYTESUR LARGE (A) 09/02/2017 1641    Radiological Exams on Admission: Dg Chest 1 View  Result Date: 11/19/2018 CLINICAL DATA:  Fall EXAM: CHEST  1 VIEW COMPARISON:  12/30/2016 FINDINGS: Lungs are clear.  No pleural effusion or pneumothorax. Cardiomegaly.  Thoracic aortic atherosclerosis. IMPRESSION: No evidence of acute cardiopulmonary disease. Thoracic aortic atherosclerosis. Electronically Signed   By: Charline Bills M.D.   On: 11/19/2018 18:13   Ct Head Wo Contrast  Result Date: 11/19/2018 CLINICAL DATA:  Fall with hip deformity EXAM: CT HEAD WITHOUT CONTRAST CT CERVICAL SPINE WITHOUT CONTRAST TECHNIQUE: Multidetector CT imaging of the head and cervical spine was performed following the standard protocol without intravenous contrast. Multiplanar CT image reconstructions of the cervical spine were also generated. COMPARISON:  CT brain 09/02/2017, 01/31/2017, 01/10/2017 FINDINGS: CT HEAD FINDINGS Brain: No acute territorial infarction, hemorrhage or intracranial mass. Moderate periventricular, subcortical and deep white matter hypodensity consistent with chronic small vessel ischemic changes. Stable ventricle size. Vascular: No hyperdense vessels. Densely calcified cavernous carotid arteries. 11 mm soft tissue density with peripheral calcification at the right cavernous sinus. Skull: No fracture.  Mastoid sclerosis. Sinuses/Orbits: Mucosal thickening and cysts in the maxillary, sphenoid and ethmoid sinuses  Other: None CT CERVICAL SPINE FINDINGS Alignment: Reversal of cervical lordosis. Trace anterolisthesis C2 on C3, C3 on C4 and C4 on C5. Trace retrolisthesis C6 on C7. Facet alignment within normal limits. Skull base and vertebrae: No acute fracture. No primary bone lesion or focal pathologic process. Soft tissues and spinal canal: No prevertebral fluid or swelling. No visible canal hematoma. Disc levels: Mild degenerative changes at C3-C4 and C4-C5 with advanced degenerative change at C5-C6 and C6-C7. Multiple level facet degenerative change. Upper chest: Negative. Other: None IMPRESSION: 1. No CT evidence for acute intracranial abnormality. Atrophy and small vessel ischemic changes of the white matter. 2. Reversal of cervical lordosis with trace anterolisthesis C2 on C3, C3 on C4 and C4  on C5, probably degenerative. No fracture is seen. If ligamentous injury is a concern, MRI could be obtained. 3. 11 mm soft tissue density with peripheral calcification at the right cavernous sinus, possibly representing an aneurysm. This could be further evaluated with CT brain angiography. Electronically Signed   By: Jasmine Pang M.D.   On: 11/19/2018 17:39   Ct Cervical Spine Wo Contrast  Result Date: 11/19/2018 CLINICAL DATA:  Fall with hip deformity EXAM: CT HEAD WITHOUT CONTRAST CT CERVICAL SPINE WITHOUT CONTRAST TECHNIQUE: Multidetector CT imaging of the head and cervical spine was performed following the standard protocol without intravenous contrast. Multiplanar CT image reconstructions of the cervical spine were also generated. COMPARISON:  CT brain 09/02/2017, 01/31/2017, 01/10/2017 FINDINGS: CT HEAD FINDINGS Brain: No acute territorial infarction, hemorrhage or intracranial mass. Moderate periventricular, subcortical and deep white matter hypodensity consistent with chronic small vessel ischemic changes. Stable ventricle size. Vascular: No hyperdense vessels. Densely calcified cavernous carotid arteries. 11 mm soft  tissue density with peripheral calcification at the right cavernous sinus. Skull: No fracture.  Mastoid sclerosis. Sinuses/Orbits: Mucosal thickening and cysts in the maxillary, sphenoid and ethmoid sinuses Other: None CT CERVICAL SPINE FINDINGS Alignment: Reversal of cervical lordosis. Trace anterolisthesis C2 on C3, C3 on C4 and C4 on C5. Trace retrolisthesis C6 on C7. Facet alignment within normal limits. Skull base and vertebrae: No acute fracture. No primary bone lesion or focal pathologic process. Soft tissues and spinal canal: No prevertebral fluid or swelling. No visible canal hematoma. Disc levels: Mild degenerative changes at C3-C4 and C4-C5 with advanced degenerative change at C5-C6 and C6-C7. Multiple level facet degenerative change. Upper chest: Negative. Other: None IMPRESSION: 1. No CT evidence for acute intracranial abnormality. Atrophy and small vessel ischemic changes of the white matter. 2. Reversal of cervical lordosis with trace anterolisthesis C2 on C3, C3 on C4 and C4 on C5, probably degenerative. No fracture is seen. If ligamentous injury is a concern, MRI could be obtained. 3. 11 mm soft tissue density with peripheral calcification at the right cavernous sinus, possibly representing an aneurysm. This could be further evaluated with CT brain angiography. Electronically Signed   By: Jasmine Pang M.D.   On: 11/19/2018 17:39   Dg Hip Unilat W Or Wo Pelvis 2-3 Views Left  Result Date: 11/19/2018 CLINICAL DATA:  Fall EXAM: DG HIP (WITH OR WITHOUT PELVIS) 2-3V LEFT COMPARISON:  None. FINDINGS: Intertrochanteric left hip fracture with foreshortening and varus angulation. Mild degenerative changes of the bilateral hips. Visualized bony pelvis appears intact. Degenerative changes of the lower lumbar spine. IMPRESSION: Intertrochanteric left hip fracture, as above. Electronically Signed   By: Charline Bills M.D.   On: 11/19/2018 18:14    EKG: Independently reviewed.    Assessment/Plan  Fall -unclear etiology but pt has chronic dizziness and has been seeing cardiology outpatient for evaluation -will check orthostatic vital signs - check TSH  Left intertrochanteric fracture -N.p.o. after midnight -Ortho to take for intramedually repair in the morning - pain ctrl with hydrocodone for mild pain and  morphine PRN severe pain  Paroxysmal atrial fibrillation - continue metoprolol - Hold Eliquis-last dose was this morning around 10am - CHAD2VASC2 of 4  Hypertension -Continue losartan  Dementia -Continue Aricept  Depression/anxiety -Continue Wellbutrin and Zoloft  Hypothyroidism -Continue levothyroxine  Protein-calorie malnutrition - consult dietician  DVT prophylaxis:SCD Code Status:Full Family Communication: Plan discussed with patient and son at bedside  disposition Plan: Home with at least 2 midnight stays  Consults called: Ortho Admission  status: inpatient   Azusena Erlandson T Antwone Capozzoli DO [facility]   If 7PM-7AM, please contact night-coverage [email or web address] Password [facility]  11/19/2018, 8:24 PM

## 2018-11-20 ENCOUNTER — Inpatient Hospital Stay: Payer: Medicare Other

## 2018-11-20 ENCOUNTER — Encounter: Payer: Self-pay | Admitting: Anesthesiology

## 2018-11-20 ENCOUNTER — Inpatient Hospital Stay: Payer: Medicare Other | Admitting: Anesthesiology

## 2018-11-20 ENCOUNTER — Encounter
Admission: EM | Disposition: A | Discharge status: Skilled Nursing Facility | Payer: Self-pay | Source: Home / Self Care | Attending: Internal Medicine

## 2018-11-20 DIAGNOSIS — I480 Paroxysmal atrial fibrillation: Secondary | ICD-10-CM

## 2018-11-20 DIAGNOSIS — E039 Hypothyroidism, unspecified: Secondary | ICD-10-CM

## 2018-11-20 DIAGNOSIS — E46 Unspecified protein-calorie malnutrition: Secondary | ICD-10-CM

## 2018-11-20 HISTORY — PX: INTRAMEDULLARY (IM) NAIL INTERTROCHANTERIC: SHX5875

## 2018-11-20 LAB — URINALYSIS, COMPLETE (UACMP) WITH MICROSCOPIC
Bacteria, UA: NONE SEEN
Bilirubin Urine: NEGATIVE
Glucose, UA: NEGATIVE mg/dL
Hgb urine dipstick: NEGATIVE
Ketones, ur: 5 mg/dL — AB
Leukocytes,Ua: NEGATIVE
Nitrite: NEGATIVE
Protein, ur: NEGATIVE mg/dL
Specific Gravity, Urine: 1.018 (ref 1.005–1.030)
pH: 6 (ref 5.0–8.0)

## 2018-11-20 LAB — SARS CORONAVIRUS 2 (TAT 6-24 HRS): SARS Coronavirus 2: NEGATIVE

## 2018-11-20 LAB — TSH: TSH: 2.162 u[IU]/mL (ref 0.350–4.500)

## 2018-11-20 SURGERY — FIXATION, FRACTURE, INTERTROCHANTERIC, WITH INTRAMEDULLARY ROD
Anesthesia: General | Laterality: Left

## 2018-11-20 MED ORDER — BUPIVACAINE HCL (PF) 0.5 % IJ SOLN
INTRAMUSCULAR | Status: AC
  Filled 2018-11-20: qty 30

## 2018-11-20 MED ORDER — EPINEPHRINE PF 1 MG/ML IJ SOLN
INTRAMUSCULAR | Status: AC
  Filled 2018-11-20: qty 1

## 2018-11-20 MED ORDER — ACETAMINOPHEN 325 MG PO TABS
325.0000 mg | ORAL_TABLET | Freq: Four times a day (QID) | ORAL | Status: DC | PRN
  Administered 2018-11-21 – 2018-11-23 (×2): 650 mg via ORAL
  Filled 2018-11-20 (×2): qty 2

## 2018-11-20 MED ORDER — CEFAZOLIN SODIUM-DEXTROSE 2-4 GM/100ML-% IV SOLN
2.0000 g | Freq: Four times a day (QID) | INTRAVENOUS | Status: AC
  Administered 2018-11-21 (×2): 2 g via INTRAVENOUS
  Filled 2018-11-20 (×2): qty 100

## 2018-11-20 MED ORDER — ONDANSETRON HCL 4 MG PO TABS: 4.0000 mg | ORAL_TABLET | Freq: Four times a day (QID) | ORAL | Status: DC | PRN

## 2018-11-20 MED ORDER — SERTRALINE HCL 50 MG PO TABS
100.0000 mg | ORAL_TABLET | Freq: Every day | ORAL | Status: DC
  Administered 2018-11-21 – 2018-11-23 (×3): 100 mg via ORAL
  Filled 2018-11-20 (×3): qty 2

## 2018-11-20 MED ORDER — PHENAZOPYRIDINE HCL 100 MG PO TABS: 100.0000 mg | ORAL_TABLET | Freq: Three times a day (TID) | ORAL | Status: DC

## 2018-11-20 MED ORDER — METOPROLOL SUCCINATE ER 25 MG PO TB24
25.0000 mg | ORAL_TABLET | Freq: Every day | ORAL | Status: DC
  Administered 2018-11-21 – 2018-11-23 (×3): 25 mg via ORAL
  Filled 2018-11-20 (×3): qty 1

## 2018-11-20 MED ORDER — DONEPEZIL HCL 5 MG PO TABS
5.0000 mg | ORAL_TABLET | Freq: Every day | ORAL | Status: DC
  Administered 2018-11-20 – 2018-11-22 (×4): 5 mg via ORAL
  Filled 2018-11-20 (×4): qty 1

## 2018-11-20 MED ORDER — SODIUM CHLORIDE 0.9 % IV SOLN
INTRAVENOUS | Status: DC
  Administered 2018-11-21: 11:00:00 via INTRAVENOUS

## 2018-11-20 MED ORDER — ONDANSETRON HCL 4 MG/2ML IJ SOLN
INTRAMUSCULAR | Status: DC | PRN
  Administered 2018-11-20: 4 mg via INTRAVENOUS

## 2018-11-20 MED ORDER — PHENYLEPHRINE HCL (PRESSORS) 10 MG/ML IV SOLN
INTRAVENOUS | Status: DC | PRN
  Administered 2018-11-20: 200 ug via INTRAVENOUS
  Administered 2018-11-20 (×3): 100 ug via INTRAVENOUS

## 2018-11-20 MED ORDER — SUGAMMADEX SODIUM 200 MG/2ML IV SOLN
INTRAVENOUS | Status: DC | PRN
  Administered 2018-11-20: 200 mg via INTRAVENOUS

## 2018-11-20 MED ORDER — ONDANSETRON HCL 4 MG/2ML IJ SOLN: 4.0000 mg | Freq: Once | INTRAMUSCULAR | Status: DC | PRN

## 2018-11-20 MED ORDER — TRAMADOL HCL 50 MG PO TABS: 50.0000 mg | ORAL_TABLET | Freq: Four times a day (QID) | ORAL | Status: DC | PRN

## 2018-11-20 MED ORDER — LACTATED RINGERS IV SOLN
INTRAVENOUS | Status: DC | PRN
  Administered 2018-11-20: 11:00:00 via INTRAVENOUS

## 2018-11-20 MED ORDER — CEFAZOLIN SODIUM-DEXTROSE 2-4 GM/100ML-% IV SOLN
INTRAVENOUS | Status: AC
  Administered 2018-11-20: 2 g via INTRAVENOUS
  Filled 2018-11-20: qty 100

## 2018-11-20 MED ORDER — FENTANYL CITRATE (PF) 100 MCG/2ML IJ SOLN
INTRAMUSCULAR | Status: AC
  Filled 2018-11-20: qty 2

## 2018-11-20 MED ORDER — HYDROCODONE-ACETAMINOPHEN 5-325 MG PO TABS
1.0000 | ORAL_TABLET | ORAL | Status: DC | PRN
  Administered 2018-11-21 – 2018-11-23 (×5): 1 via ORAL
  Filled 2018-11-20 (×5): qty 1

## 2018-11-20 MED ORDER — FENTANYL CITRATE (PF) 100 MCG/2ML IJ SOLN
INTRAMUSCULAR | Status: DC | PRN
  Administered 2018-11-20 (×4): 25 ug via INTRAVENOUS

## 2018-11-20 MED ORDER — LABETALOL HCL 5 MG/ML IV SOLN
INTRAVENOUS | Status: DC | PRN
  Administered 2018-11-20 (×2): 5 mg via INTRAVENOUS

## 2018-11-20 MED ORDER — LEVOTHYROXINE SODIUM 50 MCG PO TABS
50.0000 ug | ORAL_TABLET | ORAL | Status: DC
  Administered 2018-11-20 – 2018-11-23 (×4): 50 ug via ORAL
  Filled 2018-11-20 (×3): qty 1
  Filled 2018-11-20: qty 2

## 2018-11-20 MED ORDER — CEFAZOLIN SODIUM 1 G IJ SOLR
INTRAMUSCULAR | Status: AC
  Filled 2018-11-20: qty 20

## 2018-11-20 MED ORDER — ONDANSETRON HCL 4 MG/2ML IJ SOLN
INTRAMUSCULAR | Status: AC
  Filled 2018-11-20: qty 2

## 2018-11-20 MED ORDER — MENTHOL 3 MG MT LOZG
1.0000 | LOZENGE | OROMUCOSAL | Status: DC | PRN
  Administered 2018-11-21 – 2018-11-23 (×2): 3 mg via ORAL
  Filled 2018-11-20 (×2): qty 9

## 2018-11-20 MED ORDER — ACETAMINOPHEN 10 MG/ML IV SOLN
INTRAVENOUS | Status: DC | PRN
  Administered 2018-11-20: 1000 mg via INTRAVENOUS

## 2018-11-20 MED ORDER — LIDOCAINE HCL (CARDIAC) PF 100 MG/5ML IV SOSY
PREFILLED_SYRINGE | INTRAVENOUS | Status: DC | PRN
  Administered 2018-11-20: 100 mg via INTRAVENOUS

## 2018-11-20 MED ORDER — DEXAMETHASONE SODIUM PHOSPHATE 10 MG/ML IJ SOLN
INTRAMUSCULAR | Status: DC | PRN
  Administered 2018-11-20: 10 mg via INTRAVENOUS

## 2018-11-20 MED ORDER — FLEET ENEMA 7-19 GM/118ML RE ENEM: 1.0000 | ENEMA | Freq: Once | RECTAL | Status: DC | PRN

## 2018-11-20 MED ORDER — DEXAMETHASONE SODIUM PHOSPHATE 10 MG/ML IJ SOLN
INTRAMUSCULAR | Status: AC
  Filled 2018-11-20: qty 1

## 2018-11-20 MED ORDER — LABETALOL HCL 5 MG/ML IV SOLN
INTRAVENOUS | Status: AC
  Filled 2018-11-20: qty 4

## 2018-11-20 MED ORDER — BUPIVACAINE-EPINEPHRINE (PF) 0.5% -1:200000 IJ SOLN
INTRAMUSCULAR | Status: DC | PRN
  Administered 2018-11-20: 30 mL via PERINEURAL

## 2018-11-20 MED ORDER — ENOXAPARIN SODIUM 40 MG/0.4ML ~~LOC~~ SOLN
40.0000 mg | SUBCUTANEOUS | Status: DC
  Administered 2018-11-21: 09:00:00 40 mg via SUBCUTANEOUS
  Filled 2018-11-20: qty 0.4

## 2018-11-20 MED ORDER — ACETAMINOPHEN 10 MG/ML IV SOLN
INTRAVENOUS | Status: AC
  Filled 2018-11-20: qty 100

## 2018-11-20 MED ORDER — CEFAZOLIN SODIUM-DEXTROSE 2-4 GM/100ML-% IV SOLN
2.0000 g | Freq: Four times a day (QID) | INTRAVENOUS | Status: DC
  Administered 2018-11-20: 17:00:00 2 g via INTRAVENOUS
  Filled 2018-11-20 (×3): qty 100

## 2018-11-20 MED ORDER — DIPHENHYDRAMINE HCL 12.5 MG/5ML PO ELIX: 12.5000 mg | ORAL_SOLUTION | ORAL | Status: DC | PRN

## 2018-11-20 MED ORDER — ONDANSETRON HCL 4 MG/2ML IJ SOLN: 4.0000 mg | Freq: Four times a day (QID) | INTRAMUSCULAR | Status: DC | PRN

## 2018-11-20 MED ORDER — ACETAMINOPHEN 500 MG PO TABS
500.0000 mg | ORAL_TABLET | Freq: Four times a day (QID) | ORAL | Status: AC
  Administered 2018-11-21 (×3): 500 mg via ORAL
  Filled 2018-11-20 (×3): qty 1

## 2018-11-20 MED ORDER — FENTANYL CITRATE (PF) 100 MCG/2ML IJ SOLN: 25.0000 ug | INTRAMUSCULAR | Status: DC | PRN

## 2018-11-20 MED ORDER — MAGNESIUM HYDROXIDE 400 MG/5ML PO SUSP: 30.0000 mL | Freq: Every day | ORAL | Status: DC | PRN

## 2018-11-20 MED ORDER — MORPHINE SULFATE (PF) 4 MG/ML IV SOLN: 0.5000 mg | INTRAVENOUS | Status: DC | PRN

## 2018-11-20 MED ORDER — CHLORHEXIDINE GLUCONATE CLOTH 2 % EX PADS
6.0000 | MEDICATED_PAD | Freq: Every day | CUTANEOUS | Status: DC
  Administered 2018-11-21 – 2018-11-22 (×2): 6 via TOPICAL

## 2018-11-20 MED ORDER — PROPOFOL 10 MG/ML IV BOLUS
INTRAVENOUS | Status: DC | PRN
  Administered 2018-11-20 (×2): 50 mg via INTRAVENOUS

## 2018-11-20 MED ORDER — BISACODYL 10 MG RE SUPP: 10.0000 mg | Freq: Every day | RECTAL | Status: DC | PRN

## 2018-11-20 MED ORDER — METOCLOPRAMIDE HCL 10 MG PO TABS
5.0000 mg | ORAL_TABLET | Freq: Three times a day (TID) | ORAL | Status: DC | PRN
  Administered 2018-11-21: 02:00:00 5 mg via ORAL

## 2018-11-20 MED ORDER — ROCURONIUM BROMIDE 100 MG/10ML IV SOLN
INTRAVENOUS | Status: DC | PRN
  Administered 2018-11-20: 10 mg via INTRAVENOUS
  Administered 2018-11-20: 30 mg via INTRAVENOUS

## 2018-11-20 MED ORDER — LOSARTAN POTASSIUM 50 MG PO TABS
50.0000 mg | ORAL_TABLET | Freq: Two times a day (BID) | ORAL | Status: DC
  Administered 2018-11-20 – 2018-11-23 (×7): 50 mg via ORAL
  Filled 2018-11-20 (×7): qty 1

## 2018-11-20 MED ORDER — DOCUSATE SODIUM 100 MG PO CAPS
100.0000 mg | ORAL_CAPSULE | Freq: Two times a day (BID) | ORAL | Status: DC
  Administered 2018-11-20 – 2018-11-23 (×5): 100 mg via ORAL
  Filled 2018-11-20 (×4): qty 1

## 2018-11-20 MED ORDER — BUPROPION HCL ER (SR) 100 MG PO TB12
100.0000 mg | ORAL_TABLET | Freq: Every day | ORAL | Status: DC
  Administered 2018-11-21 – 2018-11-23 (×4): 100 mg via ORAL
  Filled 2018-11-20 (×4): qty 1

## 2018-11-20 MED ORDER — METOCLOPRAMIDE HCL 5 MG/ML IJ SOLN
5.0000 mg | Freq: Three times a day (TID) | INTRAMUSCULAR | Status: DC | PRN
  Administered 2018-11-21: 09:00:00 10 mg via INTRAVENOUS
  Filled 2018-11-20: qty 2

## 2018-11-20 SURGICAL SUPPLY — 43 items
BIT DRILL 4.0X280 (BIT) ×2 IMPLANT
BNDG COHESIVE 4X5 TAN STRL (GAUZE/BANDAGES/DRESSINGS) ×3 IMPLANT
BNDG COHESIVE 6X5 TAN STRL LF (GAUZE/BANDAGES/DRESSINGS) ×3 IMPLANT
CANISTER SUCT 1200ML W/VALVE (MISCELLANEOUS) ×3 IMPLANT
CHLORAPREP W/TINT 26 (MISCELLANEOUS) ×6 IMPLANT
COVER WAND RF STERILE (DRAPES) ×3 IMPLANT
DRAPE 3/4 80X56 (DRAPES) ×3 IMPLANT
DRAPE C-ARMOR (DRAPES) ×3 IMPLANT
DRSG OPSITE POSTOP 3X4 (GAUZE/BANDAGES/DRESSINGS) IMPLANT
DRSG OPSITE POSTOP 4X6 (GAUZE/BANDAGES/DRESSINGS) IMPLANT
ELECT CAUTERY BLADE 6.4 (BLADE) ×3 IMPLANT
ELECT REM PT RETURN 9FT ADLT (ELECTROSURGICAL) ×3
ELECTRODE REM PT RTRN 9FT ADLT (ELECTROSURGICAL) ×1 IMPLANT
GAUZE SPONGE 4X4 12PLY STRL (GAUZE/BANDAGES/DRESSINGS) ×3 IMPLANT
GLOVE BIO SURGEON STRL SZ8 (GLOVE) ×6 IMPLANT
GLOVE INDICATOR 8.0 STRL GRN (GLOVE) ×3 IMPLANT
GOWN STRL REUS W/ TWL LRG LVL3 (GOWN DISPOSABLE) ×1 IMPLANT
GOWN STRL REUS W/ TWL XL LVL3 (GOWN DISPOSABLE) ×1 IMPLANT
GOWN STRL REUS W/TWL LRG LVL3 (GOWN DISPOSABLE) ×2
GOWN STRL REUS W/TWL XL LVL3 (GOWN DISPOSABLE) ×2
GUIDE PIN 3.2X330 (PIN) ×3
GUIDEWIRE BALL NOSE 3.0X900 (WIRE) ×2 IMPLANT
MAT ABSORB  FLUID 56X50 GRAY (MISCELLANEOUS) ×2
MAT ABSORB FLUID 56X50 GRAY (MISCELLANEOUS) ×1 IMPLANT
NAIL 11X36X130D ANGLED ES (Nail) ×2 IMPLANT
NDL FILTER BLUNT 18X1 1/2 (NEEDLE) ×1 IMPLANT
NEEDLE FILTER BLUNT 18X 1/2SAF (NEEDLE) ×2
NEEDLE FILTER BLUNT 18X1 1/2 (NEEDLE) ×1 IMPLANT
NEEDLE HYPO 22GX1.5 SAFETY (NEEDLE) ×3 IMPLANT
NS IRRIG 500ML POUR BTL (IV SOLUTION) ×3 IMPLANT
PACK HIP COMPR (MISCELLANEOUS) ×3 IMPLANT
PIN GUIDE 3.2X330 (PIN) IMPLANT
SCREW LAG GALILEO FEM 10.5X100 (Screw) ×2 IMPLANT
SCREW LOCK CORT 5.0X40 (Screw) ×2 IMPLANT
STAPLER SKIN PROX 35W (STAPLE) ×3 IMPLANT
STRAP SAFETY 5IN WIDE (MISCELLANEOUS) ×3 IMPLANT
SUT VIC AB 0 CT1 36 (SUTURE) ×3 IMPLANT
SUT VIC AB 1 CT1 36 (SUTURE) ×3 IMPLANT
SUT VIC AB 2-0 CT1 (SUTURE) ×6 IMPLANT
SYR 10ML LL (SYRINGE) ×3 IMPLANT
SYR 30ML LL (SYRINGE) ×3 IMPLANT
TAPE MICROFOAM 4IN (TAPE) ×3 IMPLANT
TOOL ACTIVATION (INSTRUMENTS) ×2 IMPLANT

## 2018-11-20 NOTE — Op Note (Signed)
11/20/2018  12:30 PM  Patient:   Deborah Martinez  Pre-Op Diagnosis:   Closed displaced intertrochanteric fracture, left hip.  Post-Op Diagnosis:   Same  Procedure:   Reduction and internal fixation of closed displaced intertrochanteric left hip fracture with AOS intramedullary nail.  Surgeon:   Maryagnes Amos, MD  Assistant:   None  Anesthesia:   GET  Findings:   As above  Complications:   None  EBL:   100 cc  Fluids:   1000 cc crystalloid  UOP:   40 cc  TT:   None  Drains:   None  Closure:   Staples  Implants:   AOS 11 x 360 mm TFN with a 100 mm lag screw and a 45 mm distal interlocking screw  Brief Clinical Note:   The patient is an 85 year old female who sustained the above-noted injury yesterday evening when she apparently fell while walking into her bedroom. She was brought to the emergency room where x-rays confirmed the above-noted injury. The patient has been cleared medically and presents at this time for reduction and internal fixation of the displaced intertrochanteric right hip fracture.  Procedure:   The patient was brought into the operating room and lain in the supine position.  After adequate general endotracheal intubation and anesthesia were obtained, the patient was transferred to the fracture table. The uninjured leg was placed in a flexed and abducted position while the injured lower extremity was placed in longitudinal traction. The fracture was reduced using longitudinal traction and internal rotation. The adequacy of reduction was verified fluoroscopically in AP and lateral projections and found to be near anatomic. The lateral aspects of the right hip and thigh were prepped with ChloraPrep solution before being draped sterilely. Preoperative antibiotics were administered. A timeout was performed to verify the appropriate surgical site.   The greater trochanter was identified fluoroscopically and an approximately 3-4 cm incision was made about 2-3  fingerbreadths above the tip of the greater trochanter. The incision was carried down through the subcutaneous tissues to expose the gluteal fascia. This was split the length of the incision, providing access to the tip of the trochanter. Under fluoroscopic guidance, a guidewire was drilled through the tip of the trochanter into the proximal metaphysis to the level of the lesser trochanter. After verifying its position fluoroscopically in AP and lateral projections, it was overreamed with the initial reamer to the depth of the lesser trochanter. A guidewire was passed down through the femoral canal to the supracondylar region. The adequacy of guidewire position was verified fluoroscopically in AP and lateral projections before the length of the guidewire within the canal was measured and found to be 385 mm. Therefore, a 360 mm length nail was selected. The guidewire was overreamed sequentially using the flexible reamers, beginning with a 10.5 mm reamer and progressing to a 12.5 mm reamer. This provided good cortical chatter. The 11 x 360 mm AOS intramedullary nail was selected and advanced to the appropriate depth, as verified fluoroscopically.   The guide system for the lag screw was positioned and advanced through an approximately 2 cm stab incision over the lateral aspect of the proximal femur. The guidewire was drilled up through the trochanteric femoral nail and into the femoral neck to rest within 5 mm of subchondral bone. After verifying its position in the femoral neck and head in both AP and lateral projections, the guidewire was measured and found to be optimally replicated by a 100 mm lag screw. The  guidewire was overreamed to the appropriate depth before the lag screw was inserted and advanced to the appropriate depth as verified fluoroscopically in AP and lateral projections. Some compression was applied across the fracture site using the appropriate compression device before the lag screw was locked  into position. The set screw was then removed using the appropriate screwdriver. Again the adequacy of hardware position and fracture reduction was verified fluoroscopically in AP and lateral projections and found to be excellent.  The more distal interlocking screw was inserted through the static hole of the guide. An approximately 1.5 cm stab incision was made over the skin at the appropriate point before the drill bit was advanced through the nail and both cortices. The appropriate length of the screw was determined before the 45 mm distal interlocking screw was advanced and tightened securely. Again the adequacy of screw position was verified fluoroscopically in AP and lateral projections and found to be excellent.  The wounds were irrigated thoroughly with sterile saline solution before the abductor fascia was reapproximated using #0 Vicryl interrupted sutures. The subcutaneous tissues of all 3 wounds were closed using 2-0 Vicryl interrupted sutures before the skin was closed using staples. A total of 30 cc of 0.5% Sensorcaine was injected in around all 3 incisions to help with postoperative analgesia. Sterile occlusive dressings were applied to all wounds before the patient was transferred back to his/her hospital bed. The patient was then awakened, extubated, and transferred to the recovery room in satisfactory condition after tolerating the procedure well.

## 2018-11-20 NOTE — ED Notes (Signed)
RN encouraged pt to provide a urine sample pt reports she is not able to void on bedpan, RN offered to leave bedpan for few minutes pt agrees to have bedpan in place for a wile, Rn will monitor

## 2018-11-20 NOTE — Progress Notes (Signed)
Ice chips given to patient.

## 2018-11-20 NOTE — Transfer of Care (Signed)
Immediate Anesthesia Transfer of Care Note  Patient: Deborah Martinez  Procedure(s) Performed: INTRAMEDULLARY (IM) NAIL INTERTROCHANTRIC (Left )  Patient Location: PACU  Anesthesia Type:General  Level of Consciousness: awake, alert  and oriented  Airway & Oxygen Therapy: Patient Spontanous Breathing and Patient connected to nasal cannula oxygen  Post-op Assessment: Report given to RN and Post -op Vital signs reviewed and stable  Post vital signs: Reviewed and stable  Last Vitals:  Vitals Value Taken Time  BP 173/95 11/20/18 1230  Temp    Pulse 68 11/20/18 1234  Resp 24 11/20/18 1234  SpO2 96 % 11/20/18 1234  Vitals shown include unvalidated device data.  Last Pain:  Vitals:   11/20/18 1017  TempSrc:   PainSc: Asleep         Complications: No apparent anesthesia complications

## 2018-11-20 NOTE — Progress Notes (Signed)
Patient sleeping at this present time.  Will continue to wait for bed assignment on ortho floor.

## 2018-11-20 NOTE — Progress Notes (Signed)
PROGRESS NOTE    Deborah Martinez  OHY:[id number] DOB: 07-20-1927 DOA: 11/19/2018  PCP: [name], MD    LOS - 1   Brief Narrative:  85 y.o. female with history of paroxysmal atrial fibrillation on Eliquis, hypertension, bilateral carotid artery stenosis, anxiety depression who presents status post a fall.  Son reported hearing patient fall at home, but did not witness it. No loss of consciousness.  She has unsteady gait at baseline, family has to remind her to use a walker. In the ED, vitals notable for hypertension 190's/110.  Labs unremarkable.  Chest xray, CT's of head and C-spine negative for acute findings.  Hip xray did show left intertrochanteric hip fracture.  Orthopedics was consulted and plan for surgery today.   Subjective 11/7: Patient seen, asleep sitting up in bed.  She is confused, unable to express complaints.  States "I don't think so" when asked about pain, discomfort.  No acute events reported since admission overnight.  Assessment & Plan:   Principal Problem:   Femur fracture, left (HCC) Active Problems:   Anxiety and depression   Hypertension   Dizziness   Hypothyroid   Protein calorie malnutrition (HCC)   AF (paroxysmal atrial fibrillation) (HCC)   Fall - unclear if mechanical vs dizziness or balance issue.  No LOC or head/neck injuries.  Will check orthostatics, TSH.  PT evaluation.  Left Intertrochanteric Hip Fracture - Ortho taking to surgery today - pain control - bowel regimen - PT eval post-op  Paroxysmal A-fib - CHADS2VASC2 score 4.  Rate controlled.  Eliquis held for surgery.  Continue metoprolol.  Hypertension - continue losartan  Dementia - continue Aricept  Depression with Anxiety - continue wellbutrin and zoloft  Hypothyroidism - continue Synthroid  Protein Calorie Malnutrition - dietician consulted   DVT prophylaxis: SCD's until postop   Code Status: Full Code  Family Communication: none at bedside Disposition Plan:  Likely SNF  rehab pending PT eval postop   Consultants:   Orthopedics  Procedures:   Surgery today  Antimicrobials:   none    Objective: Vitals:   11/20/18 0821 11/20/18 1029 11/20/18 1231 11/20/18 1245  BP: (!) 179/100 136/84 (!) 173/95 131/80  Pulse: 68 70 74 69  Resp: 11 16 18 17   Temp:   97.9 F (36.6 C)   TempSrc:      SpO2: 95% 94% 95% 96%  Weight:      Height:        Intake/Output Summary (Last 24 hours) at 11/20/2018 1254 Last data filed at 11/20/2018 1235 Gross per 24 hour  Intake 1110 ml  Output 500 ml  Net 610 ml   Filed Weights   11/19/18 1658  Weight: 61.2 kg    Examination:  General exam: sleeping comfortably, awoke easily, no acute distress Respiratory system: clear to auscultation bilaterally, no wheezes, rales or rhonchi, normal respiratory effort. Cardiovascular system: normal S1/S2, RRR, no JVD, murmurs, rubs, gallops, no pedal edema.   Gastrointestinal system: soft, non-tender, non-distended abdomen, no organomegaly or masses felt, normal bowel sounds. Central nervous system: alert and oriented x4. no gross focal neurologic deficits, normal speech Extremities: left foot externally rotated, no edema, normal tone Skin: dry, intact, normal temperature Psychiatry: normal mood, flat affect    Data Reviewed: I have personally reviewed following labs and imaging studies  CBC: Recent Labs  Lab 11/19/18 1707  WBC 7.6  NEUTROABS 5.2  HGB 12.1  HCT 34.9*  MCV 88.4  PLT 330   Basic Metabolic Panel:  Recent Labs  Lab 11/19/18 1707  NA 136  K 4.2  CL 101  CO2 24  GLUCOSE 116*  BUN 17  CREATININE 0.90  CALCIUM 8.9   GFR: Estimated Creatinine Clearance: 39.7 mL/min (by C-G formula based on SCr of 0.9 mg/dL). Liver Function Tests: No results for input(s): AST, ALT, ALKPHOS, BILITOT, PROT, ALBUMIN in the last 168 hours. No results for input(s): LIPASE, AMYLASE in the last 168 hours. No results for input(s): AMMONIA in the last 168  hours. Coagulation Profile: Recent Labs  Lab 11/19/18 1707  INR 1.5*   Cardiac Enzymes: No results for input(s): CKTOTAL, CKMB, CKMBINDEX, TROPONINI in the last 168 hours. BNP (last 3 results) No results for input(s): PROBNP in the last 8760 hours. HbA1C: No results for input(s): HGBA1C in the last 72 hours. CBG: No results for input(s): GLUCAP in the last 168 hours. Lipid Profile: No results for input(s): CHOL, HDL, LDLCALC, TRIG, CHOLHDL, LDLDIRECT in the last 72 hours. Thyroid Function Tests: Recent Labs    11/20/18 0125  TSH 2.162   Anemia Panel: No results for input(s): VITAMINB12, FOLATE, FERRITIN, TIBC, IRON, RETICCTPCT in the last 72 hours. Sepsis Labs: No results for input(s): PROCALCITON, LATICACIDVEN in the last 168 hours.  Recent Results (from the past 240 hour(s))  SARS CORONAVIRUS 2 (TAT 6-24 HRS) Nasopharyngeal Nasopharyngeal Swab     Status: None   Collection Time: 11/19/18  6:11 PM   Specimen: Nasopharyngeal Swab  Result Value Ref Range Status   SARS Coronavirus 2 NEGATIVE NEGATIVE Final    Comment: (NOTE) SARS-CoV-2 target nucleic acids are NOT DETECTED. The SARS-CoV-2 RNA is generally detectable in upper and lower respiratory specimens during the acute phase of infection. Negative results do not preclude SARS-CoV-2 infection, do not rule out co-infections with other pathogens, and should not be used as the sole basis for treatment or other patient management decisions. Negative results must be combined with clinical observations, patient history, and epidemiological information. The expected result is Negative. Fact Sheet for Patients: HairSlick.no Fact Sheet for Healthcare Providers: quierodirigir.com This test is not yet approved or cleared by the [place] FDA and  has been authorized for detection and/or diagnosis of SARS-CoV-2 by FDA under an Emergency Use Authorization (EUA). This EUA  will remain  in effect (meaning this test can be used) for the duration of the COVID-19 declaration under Section 56 4(b)(1) of the Act, 21 U.S.C. section 360bbb-3(b)(1), unless the authorization is terminated or revoked sooner. Performed at [facility], [address] [place], [place] [address]          Radiology Studies: Dg Chest 1 View  Result Date: 11/19/2018 CLINICAL DATA:  Fall EXAM: CHEST  1 VIEW COMPARISON:  12/30/2016 FINDINGS: Lungs are clear.  No pleural effusion or pneumothorax. Cardiomegaly.  Thoracic aortic atherosclerosis. IMPRESSION: No evidence of acute cardiopulmonary disease. Thoracic aortic atherosclerosis. Electronically Signed   By: Charline Bills M.D.   On: 11/19/2018 18:13   Ct Head Wo Contrast  Result Date: 11/19/2018 CLINICAL DATA:  Fall with hip deformity EXAM: CT HEAD WITHOUT CONTRAST CT CERVICAL SPINE WITHOUT CONTRAST TECHNIQUE: Multidetector CT imaging of the head and cervical spine was performed following the standard protocol without intravenous contrast. Multiplanar CT image reconstructions of the cervical spine were also generated. COMPARISON:  CT brain 09/02/2017, 01/31/2017, 01/10/2017 FINDINGS: CT HEAD FINDINGS Brain: No acute territorial infarction, hemorrhage or intracranial mass. Moderate periventricular, subcortical and deep white matter hypodensity consistent with chronic small vessel ischemic  changes. Stable ventricle size. Vascular: No hyperdense vessels. Densely calcified cavernous carotid arteries. 11 mm soft tissue density with peripheral calcification at the right cavernous sinus. Skull: No fracture.  Mastoid sclerosis. Sinuses/Orbits: Mucosal thickening and cysts in the maxillary, sphenoid and ethmoid sinuses Other: None CT CERVICAL SPINE FINDINGS Alignment: Reversal of cervical lordosis. Trace anterolisthesis C2 on C3, C3 on C4 and C4 on C5. Trace retrolisthesis C6 on C7. Facet alignment within normal limits. Skull base and vertebrae:  No acute fracture. No primary bone lesion or focal pathologic process. Soft tissues and spinal canal: No prevertebral fluid or swelling. No visible canal hematoma. Disc levels: Mild degenerative changes at C3-C4 and C4-C5 with advanced degenerative change at C5-C6 and C6-C7. Multiple level facet degenerative change. Upper chest: Negative. Other: None IMPRESSION: 1. No CT evidence for acute intracranial abnormality. Atrophy and small vessel ischemic changes of the white matter. 2. Reversal of cervical lordosis with trace anterolisthesis C2 on C3, C3 on C4 and C4 on C5, probably degenerative. No fracture is seen. If ligamentous injury is a concern, MRI could be obtained. 3. 11 mm soft tissue density with peripheral calcification at the right cavernous sinus, possibly representing an aneurysm. This could be further evaluated with CT brain angiography. Electronically Signed   By: Jasmine PangKim  Fujinaga M.D.   On: 11/19/2018 17:39   Ct Cervical Spine Wo Contrast  Result Date: 11/19/2018 CLINICAL DATA:  Fall with hip deformity EXAM: CT HEAD WITHOUT CONTRAST CT CERVICAL SPINE WITHOUT CONTRAST TECHNIQUE: Multidetector CT imaging of the head and cervical spine was performed following the standard protocol without intravenous contrast. Multiplanar CT image reconstructions of the cervical spine were also generated. COMPARISON:  CT brain 09/02/2017, 01/31/2017, 01/10/2017 FINDINGS: CT HEAD FINDINGS Brain: No acute territorial infarction, hemorrhage or intracranial mass. Moderate periventricular, subcortical and deep white matter hypodensity consistent with chronic small vessel ischemic changes. Stable ventricle size. Vascular: No hyperdense vessels. Densely calcified cavernous carotid arteries. 11 mm soft tissue density with peripheral calcification at the right cavernous sinus. Skull: No fracture.  Mastoid sclerosis. Sinuses/Orbits: Mucosal thickening and cysts in the maxillary, sphenoid and ethmoid sinuses Other: None CT CERVICAL  SPINE FINDINGS Alignment: Reversal of cervical lordosis. Trace anterolisthesis C2 on C3, C3 on C4 and C4 on C5. Trace retrolisthesis C6 on C7. Facet alignment within normal limits. Skull base and vertebrae: No acute fracture. No primary bone lesion or focal pathologic process. Soft tissues and spinal canal: No prevertebral fluid or swelling. No visible canal hematoma. Disc levels: Mild degenerative changes at C3-C4 and C4-C5 with advanced degenerative change at C5-C6 and C6-C7. Multiple level facet degenerative change. Upper chest: Negative. Other: None IMPRESSION: 1. No CT evidence for acute intracranial abnormality. Atrophy and small vessel ischemic changes of the white matter. 2. Reversal of cervical lordosis with trace anterolisthesis C2 on C3, C3 on C4 and C4 on C5, probably degenerative. No fracture is seen. If ligamentous injury is a concern, MRI could be obtained. 3. 11 mm soft tissue density with peripheral calcification at the right cavernous sinus, possibly representing an aneurysm. This could be further evaluated with CT brain angiography. Electronically Signed   By: Jasmine PangKim  Fujinaga M.D.   On: 11/19/2018 17:39   Dg Hip Operative Unilat W Or W/o Pelvis Left  Result Date: 11/20/2018 CLINICAL DATA:  Patient status post ORIF proximal left femur. EXAM: OPERATIVE LEFT HIP (WITH PELVIS IF PERFORMED) 3 VIEWS TECHNIQUE: Fluoroscopic spot image(s) were submitted for interpretation post-operatively. COMPARISON:  Hip radiograph 11/19/2018 FINDINGS: Patient status  post I am nail fixation of proximal left femur fracture. Hardware appears intact. Fracture appears in improved anatomic alignment. IMPRESSION: Patient status post ORIF proximal left femur. Electronically Signed   By: Lovey Newcomer M.D.   On: 11/20/2018 12:28   Dg Hip Unilat W Or Wo Pelvis 2-3 Views Left  Result Date: 11/19/2018 CLINICAL DATA:  Fall EXAM: DG HIP (WITH OR WITHOUT PELVIS) 2-3V LEFT COMPARISON:  None. FINDINGS: Intertrochanteric left hip  fracture with foreshortening and varus angulation. Mild degenerative changes of the bilateral hips. Visualized bony pelvis appears intact. Degenerative changes of the lower lumbar spine. IMPRESSION: Intertrochanteric left hip fracture, as above. Electronically Signed   By: Julian Hy M.D.   On: 11/19/2018 18:14        Scheduled Meds:  acetaminophen  500 mg Oral Q6H   [MAR Hold] buPROPion  100 mg Oral Daily   docusate sodium  100 mg Oral BID   [MAR Hold] donepezil  5 mg Oral QHS   [START ON 11/21/2018] enoxaparin (LOVENOX) injection  40 mg Subcutaneous Q24H   [MAR Hold] levothyroxine  50 mcg Oral BH-q7a   [MAR Hold] losartan  50 mg Oral BID   [MAR Hold] metoprolol succinate  25 mg Oral Daily   [MAR Hold] sertraline  100 mg Oral Daily   Continuous Infusions:  sodium chloride      ceFAZolin (ANCEF) IV       LOS: 1 day    Time spent: 25-30 minutes    Ezekiel Slocumb, DO [facility] Pager: [phone]  If 7PM-7AM, please contact night-coverage [email or web address] Password [facility] 11/20/2018, 12:54 PM

## 2018-11-20 NOTE — H&P (Signed)
H&P reviewed and patient re-examined. No changes.   

## 2018-11-20 NOTE — Anesthesia Post-op Follow-up Note (Signed)
Anesthesia QCDR form completed.        

## 2018-11-20 NOTE — ED Notes (Signed)
OR and blood consent signed by son d/t pt forgetting what's going on. Son stated he signed yesterday but this RN unable to find paperwork. Agreed to sign again. Informed son that pt will transported to PACU to hold for OR in next few minutes.

## 2018-11-20 NOTE — Anesthesia Procedure Notes (Signed)
Procedure Name: Intubation Date/Time: 11/20/2018 11:04 AM Performed by: Sherrine Maples, CRNA Pre-anesthesia Checklist: Patient identified, Patient being monitored, Timeout performed, Emergency Drugs available and Suction available Patient Re-evaluated:Patient Re-evaluated prior to induction Oxygen Delivery Method: Circle system utilized Preoxygenation: Pre-oxygenation with 100% oxygen Induction Type: IV induction Ventilation: Mask ventilation without difficulty Laryngoscope Size: Miller and 2 Grade View: Grade I Tube type: Oral Tube size: 7.0 mm Number of attempts: 1 Airway Equipment and Method: Stylet Placement Confirmation: ETT inserted through vocal cords under direct vision,  positive ETCO2 and breath sounds checked- equal and bilateral Secured at: 20 cm Tube secured with: Tape Dental Injury: Teeth and Oropharynx as per pre-operative assessment

## 2018-11-20 NOTE — Progress Notes (Signed)
Patient resting in bed, patiently awaiting room in the hospital.Son went home, he has out number to call and check on her and we have is cell number also.

## 2018-11-20 NOTE — Progress Notes (Signed)
Spoke with pharmacy in reference to medications ordered for patient.  Will verify and send to me what is ordered.  Hope to have a room before 7pm tonight.  Ordered patient a food tray from cafeteria also.  Patient awake now and no acute distress.

## 2018-11-20 NOTE — Progress Notes (Signed)
Left foot pink in color, warm to touch.  Good, strong pedal pulse palpable.

## 2018-11-20 NOTE — Anesthesia Preprocedure Evaluation (Addendum)
Anesthesia Evaluation  Patient identified by MRN, date of birth, ID band Patient awake    Reviewed: Allergy & Precautions, NPO status , Patient's Chart, lab work & pertinent test results  Airway Mallampati: III       Dental  (+) Chipped, Dental Advisory Given, Poor Dentition Extensive chipping:   Pulmonary former smoker,           Cardiovascular hypertension, + dysrhythmias Atrial Fibrillation      Neuro/Psych PSYCHIATRIC DISORDERS Anxiety Depression negative neurological ROS     GI/Hepatic negative GI ROS, Neg liver ROS,   Endo/Other  Hypothyroidism   Renal/GU negative Renal ROS Bladder dysfunction      Musculoskeletal   Abdominal   Peds negative pediatric ROS (+)  Hematology negative hematology ROS (+)   Anesthesia Other Findings Past Medical History: No date: Anxiety No date: Constipation No date: Cystocele No date: Depression No date: Hemorrhoid No date: Hypercholesterolemia No date: Hypertension No date: Incomplete bladder emptying No date: Postmenopausal atrophic vaginitis No date: Rectocele No date: Thyroid disease No date: UTI (lower urinary tract infection) No date: Vaginal enterocele  Reproductive/Obstetrics                            Anesthesia Physical Anesthesia Plan  ASA: III  Anesthesia Plan: General   Post-op Pain Management:    Induction: Intravenous  PONV Risk Score and Plan:   Airway Management Planned: Oral ETT  Additional Equipment:   Intra-op Plan:   Post-operative Plan: Extubation in OR  Informed Consent: I have reviewed the patients History and Physical, chart, labs and discussed the procedure including the risks, benefits and alternatives for the proposed anesthesia with the patient or authorized representative who has indicated his/her understanding and acceptance.     Dental advisory given  Plan Discussed with: CRNA and  Surgeon  Anesthesia Plan Comments:         Anesthesia Quick Evaluation

## 2018-11-21 LAB — BASIC METABOLIC PANEL
Anion gap: 13 (ref 5–15)
BUN: 18 mg/dL (ref 8–23)
CO2: 24 mmol/L (ref 22–32)
Calcium: 8.1 mg/dL — ABNORMAL LOW (ref 8.9–10.3)
Chloride: 97 mmol/L — ABNORMAL LOW (ref 98–111)
Creatinine, Ser: 1.04 mg/dL — ABNORMAL HIGH (ref 0.44–1.00)
GFR calc Af Amer: 55 mL/min — ABNORMAL LOW (ref >60)
GFR calc non Af Amer: 48 mL/min — ABNORMAL LOW (ref >60)
Glucose, Bld: 126 mg/dL — ABNORMAL HIGH (ref 70–99)
Potassium: 3.7 mmol/L (ref 3.5–5.1)
Sodium: 134 mmol/L — ABNORMAL LOW (ref 135–145)

## 2018-11-21 LAB — CBC WITH DIFFERENTIAL/PLATELET
Abs Immature Granulocytes: 0.25 10*3/uL — ABNORMAL HIGH (ref 0.00–0.07)
Basophils Absolute: 0 10*3/uL (ref 0.0–0.1)
Basophils Relative: 0 %
Eosinophils Absolute: 0 10*3/uL (ref 0.0–0.5)
Eosinophils Relative: 0 %
HCT: 29.9 % — ABNORMAL LOW (ref 36.0–46.0)
Hemoglobin: 10.4 g/dL — ABNORMAL LOW (ref 12.0–15.0)
Immature Granulocytes: 2 %
Lymphocytes Relative: 5 %
Lymphs Abs: 0.8 10*3/uL (ref 0.7–4.0)
MCH: 31.1 pg (ref 26.0–34.0)
MCHC: 34.8 g/dL (ref 30.0–36.0)
MCV: 89.5 fL (ref 80.0–100.0)
Monocytes Absolute: 1.5 10*3/uL — ABNORMAL HIGH (ref 0.1–1.0)
Monocytes Relative: 9 %
Neutro Abs: 12.8 10*3/uL — ABNORMAL HIGH (ref 1.7–7.7)
Neutrophils Relative %: 84 %
Platelets: 274 10*3/uL (ref 150–400)
RBC: 3.34 MIL/uL — ABNORMAL LOW (ref 3.87–5.11)
RDW: 13.2 % (ref 11.5–15.5)
WBC: 15.4 10*3/uL — ABNORMAL HIGH (ref 4.0–10.5)
nRBC: 0 % (ref 0.0–0.2)

## 2018-11-21 MED ORDER — ADULT MULTIVITAMIN W/MINERALS CH
1.0000 | ORAL_TABLET | Freq: Every day | ORAL | Status: DC
  Administered 2018-11-22 – 2018-11-23 (×2): 1 via ORAL
  Filled 2018-11-21 (×2): qty 1

## 2018-11-21 MED ORDER — ENSURE ENLIVE PO LIQD
237.0000 mL | Freq: Two times a day (BID) | ORAL | Status: DC
  Administered 2018-11-21 – 2018-11-23 (×5): 237 mL via ORAL

## 2018-11-21 MED ORDER — APIXABAN 5 MG PO TABS
5.0000 mg | ORAL_TABLET | Freq: Two times a day (BID) | ORAL | Status: DC
  Administered 2018-11-21 – 2018-11-23 (×4): 5 mg via ORAL
  Filled 2018-11-21 (×4): qty 1

## 2018-11-21 MED ORDER — HYDROCOD POLST-CPM POLST ER 10-8 MG/5ML PO SUER
5.0000 mL | Freq: Two times a day (BID) | ORAL | Status: DC | PRN
  Administered 2018-11-21 – 2018-11-23 (×2): 5 mL via ORAL
  Filled 2018-11-21 (×2): qty 5

## 2018-11-21 MED ORDER — CALCIUM CARBONATE ANTACID 500 MG PO CHEW
2.0000 | CHEWABLE_TABLET | Freq: Two times a day (BID) | ORAL | Status: DC | PRN
  Administered 2018-11-21 – 2018-11-23 (×3): 400 mg via ORAL
  Filled 2018-11-21 (×3): qty 2

## 2018-11-21 NOTE — Progress Notes (Signed)
  Subjective: 1 Day Post-Op Procedure(s) (LRB): INTRAMEDULLARY (IM) NAIL INTERTROCHANTRIC (Left) Patient reports pain as mild.   Patient seen in rounds with Dr. Roland Rack. Patient is well, and has had no acute complaints or problems Plan is to go Skilled nursing facility after hospital stay. Negative for chest pain and shortness of breath Fever: no Gastrointestinal: Negative for nausea and vomiting  Objective: Vital signs in last 24 hours: Temp:  [97.8 F (36.6 C)-98.4 F (36.9 C)] 98.4 F (36.9 C) (11/07 2335) Pulse Rate:  [68-103] 92 (11/07 2335) Resp:  [11-18] 18 (11/07 2335) BP: (104-179)/(73-100) 138/93 (11/07 2335) SpO2:  [88 %-97 %] 93 % (11/07 2335)  Intake/Output from previous day:  Intake/Output Summary (Last 24 hours) at 11/21/2018 0733 Last data filed at 11/20/2018 1235 Gross per 24 hour  Intake 1110 ml  Output 300 ml  Net 810 ml    Intake/Output this shift: No intake/output data recorded.  Labs: Recent Labs    11/19/18 1707 11/21/18 0524  HGB 12.1 10.4*   Recent Labs    11/19/18 1707 11/21/18 0524  WBC 7.6 15.4*  RBC 3.95 3.34*  HCT 34.9* 29.9*  PLT 330 274   Recent Labs    11/19/18 1707 11/21/18 0524  NA 136 134*  K 4.2 3.7  CL 101 97*  CO2 24 24  BUN 17 18  CREATININE 0.90 1.04*  GLUCOSE 116* 126*  CALCIUM 8.9 8.1*   Recent Labs    11/19/18 1707  INR 1.5*     EXAM General - Patient is Alert and Oriented Extremity - Neurovascular intact Sensation intact distally Dorsiflexion/Plantar flexion intact Compartment soft Dressing/Incision - clean, dry, blood tinged drainage at the proximal incision Motor Function - intact, moving foot and toes well on exam.   Past Medical History:  Diagnosis Date  . Anxiety   . Constipation   . Cystocele   . Depression   . Hemorrhoid   . Hypercholesterolemia   . Hypertension   . Incomplete bladder emptying   . Postmenopausal atrophic vaginitis   . Rectocele   . Thyroid disease   . UTI (lower  urinary tract infection)   . Vaginal enterocele     Assessment/Plan: 1 Day Post-Op Procedure(s) (LRB): INTRAMEDULLARY (IM) NAIL INTERTROCHANTRIC (Left) Principal Problem:   Femur fracture, left (HCC) Active Problems:   Anxiety and depression   Hypertension   Dizziness   Hypothyroid   Protein calorie malnutrition (HCC)   AF (paroxysmal atrial fibrillation) (HCC)  Estimated body mass index is 21.79 kg/m as calculated from the following:   Height as of this encounter: 5\' 6"  (1.676 m).   Weight as of this encounter: 61.2 kg. Advance diet Up with therapy D/C IV fluids Discharge to SNF when cleared by medicine  DVT Prophylaxis - Lovenox, Foot Pumps and TED hose Weight-Bearing as tolerated to left leg  Reche Dixon, PA-C Orthopaedic Surgery 11/21/2018, 7:33 AM

## 2018-11-21 NOTE — Progress Notes (Signed)
Physical Therapy Evaluation Patient Details Name: Deborah Martinez MRN: [id number] DOB: 06-14-1927 Today's Date: 11/21/2018   History of Present Illness  Deborah Martinez is a 85 y.o. female with medical history significant of paroxysmal atrial fibrillation on Eliquis, hypertension, bilateral carotid artery stenosis, anxiety depression who presents status post a fall.Hip xray did show left intertrochanteric hip fracture.Now patient is  S/P INTRAMEDULLARY (IM) NAIL INTERTROCHANTRIC (Left)  Clinical Impression  Patient agrees to PT evaluation. She needs mod assist for supine<>sit bed mobility, mod assist for sit to stand transfer with RW. She ambulates 5 feet with RW and WBAT LLE with antalgic gait. She has decreased strength LLE 2/5 hip and 3/5 knee.  She will benefit from skilled PT to improve mobility and gait.     Follow Up Recommendations SNF    Equipment Recommendations  None recommended by PT    Recommendations for Other Services       Precautions / Restrictions Precautions Precautions: Fall Restrictions Weight Bearing Restrictions: Yes LLE Weight Bearing: Weight bearing as tolerated      Mobility  Bed Mobility Overal bed mobility: Needs Assistance Bed Mobility: Supine to Sit;Sit to Supine     Supine to sit: Mod assist Sit to supine: Mod assist   General bed mobility comments: needs cues for sequencing, impulsive  Transfers Overall transfer level: Needs assistance Equipment used: Rolling walker (2 wheeled) Transfers: Sit to/from Stand Sit to Stand: Mod assist         General transfer comment: needs VC for sequencing and safety  Ambulation/Gait Ambulation/Gait assistance: Min assist Gait Distance (Feet): 5 Feet Assistive device: Rolling walker (2 wheeled) Gait Pattern/deviations: Step-to pattern     General Gait Details: (limp with WB LLE)  Stairs            Wheelchair Mobility    Modified Rankin (Stroke Patients Only)       Balance Overall balance  assessment: Needs assistance Sitting-balance support: Bilateral upper extremity supported Sitting balance-Leahy Scale: Good     Standing balance support: Bilateral upper extremity supported Standing balance-Leahy Scale: Fair                               Pertinent Vitals/Pain Pain Assessment: Faces Faces Pain Scale: Hurts a little bit Pain Descriptors / Indicators: Aching Pain Intervention(s): Limited activity within patient's tolerance;Monitored during session    Home Living Family/patient expects to be discharged to:: Skilled nursing facility                      Prior Function Level of Independence: Independent with assistive device(s)               Hand Dominance        Extremity/Trunk Assessment   Upper Extremity Assessment Upper Extremity Assessment: Overall WFL for tasks assessed    Lower Extremity Assessment Lower Extremity Assessment: Generalized weakness;LLE deficits/detail LLE Deficits / Details: (LLE hip 2/5, knee 3/5)       Communication   Communication: No difficulties  Cognition Arousal/Alertness: Awake/alert Behavior During Therapy: WFL for tasks assessed/performed Overall Cognitive Status: History of cognitive impairments - at baseline                                        General Comments      Exercises General Exercises - Lower  Extremity Ankle Circles/Pumps: AAROM;Strengthening;Right;Left;Both;10 reps Long Arc Quad: AAROM;Strengthening;Left;5 reps Heel Slides: AAROM;Strengthening;Left;5 reps   Assessment/Plan    PT Assessment Patient needs continued PT services  PT Problem List Decreased strength;Decreased activity tolerance;Decreased balance;Decreased mobility;Decreased safety awareness       PT Treatment Interventions Gait training;Functional mobility training;Therapeutic activities;Therapeutic exercise;Balance training    PT Goals (Current goals can be found in the Care Plan section)   Acute Rehab PT Goals Patient Stated Goal: to walk PT Goal Formulation: With patient Time For Goal Achievement: 12/05/18 Potential to Achieve Goals: Good    Frequency BID   Barriers to discharge        Co-evaluation               AM-PAC PT "6 Clicks" Mobility  Outcome Measure Help needed turning from your back to your side while in a flat bed without using bedrails?: A Lot Help needed moving from lying on your back to sitting on the side of a flat bed without using bedrails?: A Lot Help needed moving to and from a bed to a chair (including a wheelchair)?: A Lot Help needed standing up from a chair using your arms (e.g., wheelchair or bedside chair)?: A Lot Help needed to walk in hospital room?: A Lot Help needed climbing 3-5 steps with a railing? : Total 6 Click Score: 11    End of Session Equipment Utilized During Treatment: Gait belt Activity Tolerance: Patient limited by fatigue;Patient limited by pain Patient left: in bed;with call bell/phone within reach;with bed alarm set Nurse Communication: Mobility status PT Visit Diagnosis: Unsteadiness on feet (R26.81);Muscle weakness (generalized) (M62.81);History of falling (Z91.81);Difficulty in walking, not elsewhere classified (R26.2);Pain    Time: 1010-1035 PT Time Calculation (min) (ACUTE ONLY): 25 min   Charges:   PT Evaluation $PT Eval Low Complexity: 1 Low PT Treatments $Therapeutic Exercise: 8-22 mins          Alanson Puls, PT DPT 11/21/2018, 12:18 PM

## 2018-11-21 NOTE — TOC Initial Note (Addendum)
Transition of Care [facility]) - Initial/Assessment Note    Patient Details  Name: Deborah Martinez MRN: [id number] Date of Birth: 15-Dec-1927  Transition of Care [facility]) CM/SW Contact:    Verna Czech [place], [place] Phone Number: 11/21/2018, 3:00 PM  Clinical Narrative:                 Patient is a 85 year old female admitted to the hospital following a fall resulting in a hip fracture. Patient's son [name] was at bedside and states that he is patient's POA. Per patient's son, patient was living in a Independent living facility with her second husband before the fall. Although several years older, he was her main caregiver. Patient has a can and walker at home. Patient has no other services in the home. Patient's spouse has now been admitted to [facility] following a fall of his own, his status is pending.  Patient's son does not feel that it is safe for patient to return to Independent Living due to spouse's hospitalization and her cognitive issues. He would like patient to transition to long term care following her rehab stay.  Per patient's son, patient has a long term care insurance policy and other assets that could be possibly used towards long term care. He will be meeting with [facility] next week as well as an Retail buyer for assistance with how to utilize her long term care policy and other assets towards long term care. The placement process discussed with patient and her son. Patient reluctantly agreed to short term rehab, however per patient's son "it is an absolute necessity".  FL2  to be completed to begin placement process for short rehab.  PASRR completed, went to manual review   Chrystal Land, LCSW Clinical Social Work [phone]  Expected Discharge Plan: Skilled Nursing Facility Barriers to Discharge: Unsafe home situation(per patient's son, her husband who was her primary caretaker has now been hospitalized following a fall)   Patient Goals  and CMS Choice Patient states their goals for this hospitalization and ongoing recovery are:: "I am not sure, I am 85 years old"   Choice offered to / list presented to : Adult Children  Expected Discharge Plan and Services Expected Discharge Plan: Skilled Nursing Facility In-house Referral: Clinical Social Work Discharge Planning Services: (P) CM Consult Post Acute Care Choice: Skilled Nursing Facility(patient's son would like long term care in a memory care unit following rehab) Living arrangements for the past 2 months: ([facility])                                      Prior Living Arrangements/Services Living arrangements for the past 2 months: ([facility]) Lives with:: Spouse Patient language and need for interpreter reviewed:: No Do you feel safe going back to the place where you live?: Yes(patient's son does not feel it is safe for patient to return home alone)      Need for Family Participation in Patient Care: Yes (Comment) Care giver support system in place?: No (comment)(patient's son is POA, however patient's spouse who was patient's caregiver has now been hospitalized) Current home services: DME(cane ,walker) Criminal Activity/Legal Involvement Pertinent to Current Situation/Hospitalization: No - Comment as needed  Activities of Daily Living   ADL Screening (condition at time of admission) Patient's cognitive ability adequate to safely complete daily activities?: No Is the patient  deaf or have difficulty hearing?: No Does the patient have difficulty seeing, even when wearing glasses/contacts?: No Does the patient have difficulty concentrating, remembering, or making decisions?: Yes Patient able to express need for assistance with ADLs?: Yes Does the patient have difficulty dressing or bathing?: Yes Independently performs ADLs?: No Communication: Independent Dressing (OT): Needs assistance Is this a change from baseline?:  Pre-admission baseline Grooming: Needs assistance Feeding: Independent Bathing: Needs assistance Is this a change from baseline?: Pre-admission baseline Toileting: Needs assistance Is this a change from baseline?: Pre-admission baseline In/Out Bed: Needs assistance Is this a change from baseline?: Change from baseline, expected to last >3 days Walks in Home: Independent Weakness of Legs: Both Weakness of Arms/Hands: Both  Permission Sought/Granted Permission sought to share information with : Family Supports Permission granted to share information with : Yes, Verbal Permission Granted  Share Information with NAME: Hilaria Ota     Permission granted to share info w Relationship: son  Permission granted to share info w Contact Information: [phone]  Emotional Assessment Appearance:: Appears stated age Attitude/Demeanor/Rapport: Engaged   Orientation: : Oriented to Self, Oriented to Place Alcohol / Substance Use: Not Applicable Psych Involvement: No (comment)  Admission diagnosis:  Closed left hip fracture ([place]) [S72.002A] Closed fracture of left hip, initial encounter ([place]) [S72.002A] Fall, initial encounter [W19.XXXA] Patient Active Problem List   Diagnosis Date Noted  . Hypothyroid 11/20/2018  . Protein calorie malnutrition ([place]) 11/20/2018  . AF (paroxysmal atrial fibrillation) ([place]) 11/20/2018  . Femur fracture, left ([place]) 11/19/2018  . UTI (urinary tract infection) 02/01/2017  . Chest pain 01/10/2017  . Dizziness 01/10/2017  . Ataxia 01/10/2017  . Cystocele, midline 07/27/2014  . Menopause 07/27/2014  . Vaginal atrophy 07/27/2014  . Incomplete bladder emptying 07/27/2014  . Anxiety and depression 07/27/2014  . Constipation 07/27/2014  . Hypertension 07/27/2014  . Hypercholesterolemia 07/27/2014  . Hemorrhoids 07/27/2014  . Rectocele 07/27/2014  . Vaginal enterocele 07/27/2014   PCP:  Maryland Pink, MD Pharmacy:   [facility], [place]  - [address] [place] [address] Phone: [phone] Fax: [phone]     Social Determinants of Health (SDOH) Interventions    Readmission Risk Interventions No flowsheet data found.

## 2018-11-21 NOTE — Discharge Instructions (Signed)
INSTRUCTIONS AFTER Surgery  o Remove items at home which could result in a fall. This includes throw rugs or furniture in walking pathways o ICE to the affected joint every three hours while awake for 30 minutes at a time, for at least the first 3-5 days, and then as needed for pain and swelling.  Continue to use ice for pain and swelling. You may notice swelling that will progress down to the foot and ankle.  This is normal after surgery.  Elevate your leg when you are not up walking on it.   o Continue to use the breathing machine you got in the hospital (incentive spirometer) which will help keep your temperature down.  It is common for your temperature to cycle up and down following surgery, especially at night when you are not up moving around and exerting yourself.  The breathing machine keeps your lungs expanded and your temperature down.   DIET:  As you were doing prior to hospitalization, we recommend a well-balanced diet.  DRESSING / WOUND CARE / SHOWERING  Wound is to stay dry.  No showering.  Change the dressing as needed.  The patient will have staples removed in 2 weeks at [facility]  o Increase activity slowly as tolerated, but follow the weight bearing instructions below.   o No driving for 6 weeks or until further direction given by your physician.  You cannot drive while taking narcotics.  o No lifting or carrying greater than 10 lbs. until further directed by your surgeon. o Avoid periods of inactivity such as sitting longer than an hour when not asleep. This helps prevent blood clots.  o You may return to work once you are authorized by your doctor.     WEIGHT BEARING  Weightbearing as tolerated on the left   EXERCISES Gait training and ambulation exercises.  CONSTIPATION  Constipation is defined medically as fewer than three stools per week and severe constipation as less than one stool per week.  Even if you have a regular bowel pattern at home,  your normal regimen is likely to be disrupted due to multiple reasons following surgery.  Combination of anesthesia, postoperative narcotics, change in appetite and fluid intake all can affect your bowels.   YOU MUST use at least one of the following options; they are listed in order of increasing strength to get the job done.  They are all available over the counter, and you may need to use some, POSSIBLY even all of these options:    Drink plenty of fluids (prune juice may be helpful) and high fiber foods Colace 100 mg by mouth twice a day  Senokot for constipation as directed and as needed Dulcolax (bisacodyl), take with full glass of water  Miralax (polyethylene glycol) once or twice a day as needed.  If you have tried all these things and are unable to have a bowel movement in the first 3-4 days after surgery call either your surgeon or your primary doctor.    If you experience loose stools or diarrhea, hold the medications until you stool forms back up.  If your symptoms do not get better within 1 week or if they get worse, check with your doctor.  If you experience "the worst abdominal pain ever" or develop nausea or vomiting, please contact the office immediately for further recommendations for treatment.   ITCHING:  If you experience itching with your medications, try taking only a single pain pill, or even half a  pain pill at a time.  You can also use Benadryl over the counter for itching or also to help with sleep.   TED HOSE STOCKINGS:  Use stockings on both legs until for at least 2 weeks or as directed by physician office. They may be removed at night for sleeping.  MEDICATIONS:  See your medication summary on the After Visit Summary that nursing will review with you.  You may have some home medications which will be placed on hold until you complete the course of blood thinner medication.  It is important for you to complete the blood thinner medication as  prescribed.  PRECAUTIONS:  If you experience chest pain or shortness of breath - call 911 immediately for transfer to the hospital emergency department.   If you develop a fever greater that 101 F, purulent drainage from wound, increased redness or drainage from wound, foul odor from the wound/dressing, or calf pain - CONTACT YOUR SURGEON.                                                   FOLLOW-UP APPOINTMENTS:  If you do not already have a post-op appointment, please call the office for an appointment to be seen by your surgeon.  Guidelines for how soon to be seen are listed in your After Visit Summary, but are typically between 1-4 weeks after surgery.  OTHER INSTRUCTIONS:     MAKE SURE YOU:   Understand these instructions.   Get help right away if you are not doing well or get worse.    Thank you for letting us be a part of your medical care team.  It is a privilege we respect greatly.  We hope these instructions will help you stay on track for a fast and full recovery!

## 2018-11-21 NOTE — Progress Notes (Signed)
Initial Nutrition Assessment  RD working remotely.  DOCUMENTATION CODES:   Not applicable  INTERVENTION:  Recommend liberalizing diet to regular.  Provide Ensure Enlive po BID, each supplement provides 350 kcal and 20 grams of protein.  Provide daily MVI.  NUTRITION DIAGNOSIS:   Increased nutrient needs related to post-op healing as evidenced by estimated needs.  GOAL:   Patient will meet greater than or equal to 90% of their needs  MONITOR:   PO intake, Supplement acceptance, Labs, Weight trends, Skin, I & O's  REASON FOR ASSESSMENT:   Consult Assessment of nutrition requirement/status  ASSESSMENT:   85 year old female with PMHx of anxiety, depression, HTN, paroxysmal A-fib on Eliquis who is admitted s/p fall found to have left intertrochanteric hip fracture s/p reduction and internall fixation with intramedullary nail on 11/7.   Spoke with patient over the phone. She was not able to provide a very thorough nutrition or weight history due to reported back pain. She reports her appetite and intake are "okay" but she is unable to provide any details on intake. She is very hesitant to drink oral nutrition supplement but after discussing increased needs for healing she is willing to try them.  Patient is unsure of her weight history. There is limited recent weight history in chart. She was 65.8 kg on 09/02/2017. She is now 61.2 kg (135 lbs) if current weight is accurate (appears stated). That would be insignificant weight loss for time frame but concerned that current weight is not truly measured.  Medications reviewed and include: Colace 100 mg BID, levothyroxine, sertraline, NS at 50 mL/hr.   Labs reviewed: Sodium 134, Chloride 97, Creatinine 1.04.  Patient is at risk for malnutrition. Unable to determine if she meets criteria without full nutrition/weight history or NFPE.  NUTRITION - FOCUSED PHYSICAL EXAM:  Unable to complete at this time.  Diet Order:   Diet Order             Diet heart healthy/carb modified Room service appropriate? Yes; Fluid consistency: Thin  Diet effective now             EDUCATION NEEDS:   Not appropriate for education at this time  Skin:  Skin Assessment: Reviewed RN Assessment  Last BM:  Unknown  Height:   Ht Readings from Last 1 Encounters:  11/19/18 5\' 6"  (1.676 m)   Weight:   Wt Readings from Last 1 Encounters:  11/19/18 61.2 kg   Ideal Body Weight:  59.1 kg  BMI:  Body mass index is 21.79 kg/m.  Estimated Nutritional Needs:   Kcal:  1500-1700  Protein:  75-85 grams  Fluid:  1.5-1.7 L/day  Jacklynn Barnacle, MS, RD, LDN Office: [phone] Pager: [phone] After Hours/Weekend Pager: [phone]

## 2018-11-21 NOTE — NC FL2 (Signed)
[place] LEVEL OF CARE SCREENING TOOL     IDENTIFICATION  Patient Name: Deborah Martinez Birthdate: 1927/07/29 Sex: female Admission Date (Current Location): 11/19/2018  [place] and [place] Number:  Engineering geologist and Address:  [facility], [address], [place], [place] [address]      Provider Number:    Attending Physician Name and Address:  Ezekiel Slocumb, DO  Relative Name and Phone Number:  Hilaria Ota [phone]    Current Level of Care: SNF Recommended Level of Care: [place] Prior Approval Number:    Date Approved/Denied:   PASRR Number:    Discharge Plan: SNF    Current Diagnoses: Patient Active Problem List   Diagnosis Date Noted  . Hypothyroid 11/20/2018  . Protein calorie malnutrition ([place]) 11/20/2018  . AF (paroxysmal atrial fibrillation) ([place]) 11/20/2018  . Femur fracture, left ([place]) 11/19/2018  . UTI (urinary tract infection) 02/01/2017  . Chest pain 01/10/2017  . Dizziness 01/10/2017  . Ataxia 01/10/2017  . Cystocele, midline 07/27/2014  . Menopause 07/27/2014  . Vaginal atrophy 07/27/2014  . Incomplete bladder emptying 07/27/2014  . Anxiety and depression 07/27/2014  . Constipation 07/27/2014  . Hypertension 07/27/2014  . Hypercholesterolemia 07/27/2014  . Hemorrhoids 07/27/2014  . Rectocele 07/27/2014  . Vaginal enterocele 07/27/2014    Orientation RESPIRATION BLADDER Height & Weight     Self, Situation, Place  Normal Continent Weight: 135 lb (61.2 kg) Height:  5\' 6"  (167.6 cm)  BEHAVIORAL SYMPTOMS/MOOD NEUROLOGICAL BOWEL NUTRITION STATUS      Continent Diet  AMBULATORY STATUS COMMUNICATION OF NEEDS Skin   Limited Assist Verbally Normal                       Personal Care Assistance Level of Assistance  Bathing, Feeding, Dressing Bathing Assistance: Maximum assistance Feeding assistance: Limited assistance Dressing Assistance: Maximum assistance      Functional Limitations Info             SPECIAL CARE FACTORS FREQUENCY  PT (By licensed PT), OT (By licensed OT)     PT Frequency: 5x per week OT Frequency: 5x per week            Contractures Contractures Info: Not present    Additional Factors Info  Psychotropic, Code Status Code Status Info: full code   Psychotropic Info: wellbutrin 100mg  daily         Current Medications (11/21/2018):  This is the current hospital active medication list Current Facility-Administered Medications  Medication Dose Route Frequency Provider Last Rate Last Dose  . 0.9 %  sodium chloride infusion   Intravenous Continuous Poggi, Marshall Cork, MD 50 mL/hr at 11/21/18 1039    . acetaminophen (TYLENOL) tablet 325-650 mg  325-650 mg Oral Q6H PRN Poggi, Marshall Cork, MD      . acetaminophen (TYLENOL) tablet 500 mg  500 mg Oral Q6H Poggi, Marshall Cork, MD   500 mg at 11/21/18 1132  . apixaban (ELIQUIS) tablet 5 mg  5 mg Oral BID Nicole Kindred A, DO      . bisacodyl (DULCOLAX) suppository 10 mg  10 mg Rectal Daily PRN Poggi, Marshall Cork, MD      . buPROPion [facility] SR) 12 hr tablet 100 mg  100 mg Oral Daily Poggi, Marshall Cork, MD   100 mg at 11/21/18 0901  . calcium carbonate (TUMS - dosed in mg elemental calcium) chewable tablet 400 mg of elemental calcium  2  tablet Oral BID PRN Esaw Grandchild A, DO   400 mg of elemental calcium at 11/21/18 0809  . Chlorhexidine Gluconate Cloth 2 % PADS 6 each  6 each Topical Daily Esaw Grandchild A, DO   6 each at 11/21/18 1133  . chlorpheniramine-HYDROcodone (TUSSIONEX) 10-8 MG/5ML suspension 5 mL  5 mL Oral Q12H PRN Jimmye Norman, NP   5 mL at 11/21/18 0328  . diphenhydrAMINE (BENADRYL) 12.5 MG/5ML elixir 12.5-25 mg  12.5-25 mg Oral Q4H PRN Poggi, Excell Seltzer, MD      . docusate sodium (COLACE) capsule 100 mg  100 mg Oral BID Poggi, Excell Seltzer, MD   100 mg at 11/21/18 0902  . donepezil (ARICEPT) tablet 5 mg  5 mg Oral QHS Poggi, Excell Seltzer, MD   5 mg at 11/20/18 2249  . feeding  supplement (ENSURE ENLIVE) (ENSURE ENLIVE) liquid 237 mL  237 mL Oral BID BM Esaw Grandchild A, DO      . HYDROcodone-acetaminophen (NORCO/VICODIN) 5-325 MG per tablet 1-2 tablet  1-2 tablet Oral Q4H PRN Poggi, Excell Seltzer, MD      . levothyroxine (SYNTHROID) tablet 50 mcg  50 mcg Oral Bonney Roussel, MD   50 mcg at 11/21/18 [phone]  . losartan (COZAAR) tablet 50 mg  50 mg Oral BID Christena Flake, MD   50 mg at 11/21/18 0902  . magnesium hydroxide (MILK OF MAGNESIA) suspension 30 mL  30 mL Oral Daily PRN Poggi, Excell Seltzer, MD      . menthol-cetylpyridinium (CEPACOL) lozenge 3 mg  1 lozenge Oral PRN Jimmye Norman, NP   3 mg at 11/21/18 0950  . metoCLOPramide (REGLAN) tablet 5-10 mg  5-10 mg Oral Q8H PRN Poggi, Excell Seltzer, MD   5 mg at 11/21/18 1191   Or  . metoCLOPramide (REGLAN) injection 5-10 mg  5-10 mg Intravenous Q8H PRN Poggi, Excell Seltzer, MD   10 mg at 11/21/18 0858  . metoprolol succinate (TOPROL-XL) 24 hr tablet 25 mg  25 mg Oral Daily Poggi, Excell Seltzer, MD   25 mg at 11/21/18 0902  . morphine 2 MG/ML injection 0.5 mg  0.5 mg Intravenous Q2H PRN Tu, Ching T, DO   0.5 mg at 11/19/18 2336  . [START ON 11/22/2018] multivitamin with minerals tablet 1 tablet  1 tablet Oral Daily Esaw Grandchild A, DO      . ondansetron (ZOFRAN) tablet 4 mg  4 mg Oral Q6H PRN Poggi, Excell Seltzer, MD       Or  . ondansetron (ZOFRAN) injection 4 mg  4 mg Intravenous Q6H PRN Poggi, Excell Seltzer, MD      . senna-docusate (Senokot-S) tablet 1 tablet  1 tablet Oral QHS PRN Poggi, Excell Seltzer, MD      . sertraline (ZOLOFT) tablet 100 mg  100 mg Oral Daily Poggi, Excell Seltzer, MD   100 mg at 11/21/18 0901  . sodium phosphate (FLEET) 7-19 GM/118ML enema 1 enema  1 enema Rectal Once PRN Poggi, Excell Seltzer, MD         Discharge Medications: Please see discharge summary for a list of discharge medications.  Relevant Imaging Results:  Relevant Lab Results:   Additional Information    [place], [place], [place]

## 2018-11-21 NOTE — Anesthesia Postprocedure Evaluation (Signed)
Anesthesia Post Note  Patient: Deborah Martinez  Procedure(s) Performed: INTRAMEDULLARY (IM) NAIL INTERTROCHANTRIC (Left )  Patient location during evaluation: PACU Anesthesia Type: General Level of consciousness: awake and alert and oriented Pain management: pain level controlled Vital Signs Assessment: post-procedure vital signs reviewed and stable Respiratory status: spontaneous breathing Cardiovascular status: blood pressure returned to baseline Anesthetic complications: no     Last Vitals:  Vitals:   11/20/18 2335 11/21/18 0743  BP: (!) 138/93 127/82  Pulse: 92 94  Resp: 18 17  Temp: 36.9 C 36.9 C  SpO2: 93% 91%    Last Pain:  Vitals:   11/21/18 0800  TempSrc:   PainSc: 0-No pain                 Khi Mcmillen

## 2018-11-21 NOTE — Progress Notes (Signed)
PROGRESS NOTE    Deborah LenzBetty W Martinez  ZOX:[id number] DOB: 09-Apr-1927 DOA: 11/19/2018  PCP: Jerl MinaHedrick, James, MD    LOS - 2   Brief Narrative:  85 y.o.femalewith history ofparoxysmal atrial fibrillation on Eliquis, hypertension, bilateral carotid artery stenosis, anxiety depression who presents status post a fall. Son reported hearing patient fall at home, but did not witness it.[place]oss of consciousness.She has unsteady gait at baseline, family has to remind her to use a walker. In the ED, vitals notable for hypertension 190's/110.  Labs unremarkable.  Chest xray, CT's of head and C-spine negative for acute findings.  Hip xray did show left intertrochanteric hip fracture.  Orthopedics was consulted and plan for surgery 11/7.   Subjective 11/8: Patient seen and examined, awake sitting up in bed.   Not eating much of her breakfast poor appetite.  States she "cannot breathe" and holds her hand over her lower neck upper chest, describes feeling tight.  She has no dyspnea or hypoxia and vocal projection is normal.  Appears in no distress.  Denies pain at rest but significant pain with movement.  Assessment & Plan:   Principal Problem:   Femur fracture, left (HCC) Active Problems:   Anxiety and depression   Hypertension   Dizziness   Hypothyroid   Protein calorie malnutrition (HCC)   AF (paroxysmal atrial fibrillation) (HCC)   Fall - unclear if mechanical vs dizziness or balance issue.  No LOC or head/neck injuries.  Will check orthostatics (pending), TSH normal.  PT evaluation, recommend SNF.  Case management following.  Left Intertrochanteric Hip Fracture -status post surgery on 11/7 -Ortho following - pain control - bowel regimen - PT eval post-op, recommend SNF  Leukocytosis -likely reactive due to fracture and surgery.  No evidence of infection at this time patient afebrile and without symptomatic complaints. -Follow CBC  Acute blood loss anemia, mild -secondary to surgery.   Hemoglobin 10.4 from 12.1 yesterday.  No evidence of active bleeding.  Follow CBC  Paroxysmal A-fib - CHADS2VASC2 score 4.  Rate controlled.  Eliquis held for surgery, now resumed.  Continue metoprolol.  Hypertension - continue losartan  Dementia - continue Aricept  Depression with Anxiety - continue wellbutrin and zoloft  Hypothyroidism - continue Synthroid  Protein Calorie Malnutrition - dietician consulted   DVT prophylaxis:  Eliquis, foot pumps and TED hose   Code Status: Full Code  Family Communication: none at bedside Disposition Plan:  Likely SNF rehab pending PT eval postop   Consultants:   Orthopedics  Procedures:   Surgery today  Antimicrobials:   none    Objective: Vitals:   11/20/18 1830 11/20/18 1902 11/20/18 2335 11/21/18 0743  BP: 140/85 104/75 (!) 138/93 127/82  Pulse: 90 (!) 103 92 94  Resp: 18 18 18 17   Temp: 98.2 F (36.8 C) 97.8 F (36.6 C) 98.4 F (36.9 C) 98.5 F (36.9 C)  TempSrc: Oral Oral    SpO2: 92% (!) 88% 93% 91%  Weight:      Height:        Intake/Output Summary (Last 24 hours) at 11/21/2018 0744 Last data filed at 11/20/2018 1235 Gross per 24 hour  Intake 1110 ml  Output 300 ml  Net 810 ml   Filed Weights   11/19/18 1658  Weight: 61.2 kg    Examination:  General exam: awake, alert, no acute distress HEENT: moist mucus membranes, hearing grossly normal, neck supple and nontender, no thyromegaly or masses palpated. Respiratory system: clear to auscultation bilaterally, no wheezes, rales  or rhonchi, normal respiratory effort. Cardiovascular system: normal S1/S2, RRR, no JVD, murmurs, rubs, gallops,  no pedal edema.   Gastrointestinal system: soft, non-tender, non-distended abdomen, normal bowel sounds. Central nervous system: alert and oriented x3. no gross focal neurologic deficits, normal speech Extremities: Left hip surgical site without warmth or erythema, dressing intact, no cyanosis, normal tone Skin:  dry, intact, normal temperature Psychiatry: Anxious mood, congruent affect    Data Reviewed: I have personally reviewed following labs and imaging studies  CBC: Recent Labs  Lab 11/19/18 1707 11/21/18 0524  WBC 7.6 15.4*  NEUTROABS 5.2 12.8*  HGB 12.1 10.4*  HCT 34.9* 29.9*  MCV 88.4 89.5  PLT 330 274   Basic Metabolic Panel: Recent Labs  Lab 11/19/18 1707 11/21/18 0524  NA 136 134*  K 4.2 3.7  CL 101 97*  CO2 24 24  GLUCOSE 116* 126*  BUN 17 18  CREATININE 0.90 1.04*  CALCIUM 8.9 8.1*   GFR: Estimated Creatinine Clearance: 34.3 mL/min (A) (by C-G formula based on SCr of 1.04 mg/dL (H)). Liver Function Tests: No results for input(s): AST, ALT, ALKPHOS, BILITOT, PROT, ALBUMIN in the last 168 hours. No results for input(s): LIPASE, AMYLASE in the last 168 hours. No results for input(s): AMMONIA in the last 168 hours. Coagulation Profile: Recent Labs  Lab 11/19/18 1707  INR 1.5*   Cardiac Enzymes: No results for input(s): CKTOTAL, CKMB, CKMBINDEX, TROPONINI in the last 168 hours. BNP (last 3 results) No results for input(s): PROBNP in the last 8760 hours. HbA1C: No results for input(s): HGBA1C in the last 72 hours. CBG: No results for input(s): GLUCAP in the last 168 hours. Lipid Profile: No results for input(s): CHOL, HDL, LDLCALC, TRIG, CHOLHDL, LDLDIRECT in the last 72 hours. Thyroid Function Tests: Recent Labs    11/20/18 0125  TSH 2.162   Anemia Panel: No results for input(s): VITAMINB12, FOLATE, FERRITIN, TIBC, IRON, RETICCTPCT in the last 72 hours. Sepsis Labs: No results for input(s): PROCALCITON, LATICACIDVEN in the last 168 hours.  Recent Results (from the past 240 hour(s))  SARS CORONAVIRUS 2 (TAT 6-24 HRS) Nasopharyngeal Nasopharyngeal Swab     Status: None   Collection Time: 11/19/18  6:11 PM   Specimen: Nasopharyngeal Swab  Result Value Ref Range Status   SARS Coronavirus 2 NEGATIVE NEGATIVE Final    Comment: (NOTE) SARS-CoV-2 target  nucleic acids are NOT DETECTED. The SARS-CoV-2 RNA is generally detectable in upper and lower respiratory specimens during the acute phase of infection. Negative results do not preclude SARS-CoV-2 infection, do not rule out co-infections with other pathogens, and should not be used as the sole basis for treatment or other patient management decisions. Negative results must be combined with clinical observations, patient history, and epidemiological information. The expected result is Negative. Fact Sheet for Patients: HairSlick.no Fact Sheet for Healthcare Providers: quierodirigir.com This test is not yet approved or cleared by the [place] FDA and  has been authorized for detection and/or diagnosis of SARS-CoV-2 by FDA under an Emergency Use Authorization (EUA). This EUA will remain  in effect (meaning this test can be used) for the duration of the COVID-19 declaration under Section 56 4(b)(1) of the Act, 21 U.S.C. section 360bbb-3(b)(1), unless the authorization is terminated or revoked sooner. Performed at [facility], [address], [place], [place] [address]          Radiology Studies: Dg Chest 1 View  Result Date: 11/19/2018 CLINICAL DATA:  Fall EXAM: CHEST  1  VIEW COMPARISON:  12/30/2016 FINDINGS: Lungs are clear.  No pleural effusion or pneumothorax. Cardiomegaly.  Thoracic aortic atherosclerosis. IMPRESSION: No evidence of acute cardiopulmonary disease. Thoracic aortic atherosclerosis. Electronically Signed   By: Charline Bills M.D.   On: 11/19/2018 18:13   Ct Head Wo Contrast  Result Date: 11/19/2018 CLINICAL DATA:  Fall with hip deformity EXAM: CT HEAD WITHOUT CONTRAST CT CERVICAL SPINE WITHOUT CONTRAST TECHNIQUE: Multidetector CT imaging of the head and cervical spine was performed following the standard protocol without intravenous contrast. Multiplanar CT image reconstructions of the cervical spine  were also generated. COMPARISON:  CT brain 09/02/2017, 01/31/2017, 01/10/2017 FINDINGS: CT HEAD FINDINGS Brain: No acute territorial infarction, hemorrhage or intracranial mass. Moderate periventricular, subcortical and deep white matter hypodensity consistent with chronic small vessel ischemic changes. Stable ventricle size. Vascular: No hyperdense vessels. Densely calcified cavernous carotid arteries. 11 mm soft tissue density with peripheral calcification at the right cavernous sinus. Skull: No fracture.  Mastoid sclerosis. Sinuses/Orbits: Mucosal thickening and cysts in the maxillary, sphenoid and ethmoid sinuses Other: None CT CERVICAL SPINE FINDINGS Alignment: Reversal of cervical lordosis. Trace anterolisthesis C2 on C3, C3 on C4 and C4 on C5. Trace retrolisthesis C6 on C7. Facet alignment within normal limits. Skull base and vertebrae: No acute fracture. No primary bone lesion or focal pathologic process. Soft tissues and spinal canal: No prevertebral fluid or swelling. No visible canal hematoma. Disc levels: Mild degenerative changes at C3-C4 and C4-C5 with advanced degenerative change at C5-C6 and C6-C7. Multiple level facet degenerative change. Upper chest: Negative. Other: None IMPRESSION: 1. No CT evidence for acute intracranial abnormality. Atrophy and small vessel ischemic changes of the white matter. 2. Reversal of cervical lordosis with trace anterolisthesis C2 on C3, C3 on C4 and C4 on C5, probably degenerative. No fracture is seen. If ligamentous injury is a concern, MRI could be obtained. 3. 11 mm soft tissue density with peripheral calcification at the right cavernous sinus, possibly representing an aneurysm. This could be further evaluated with CT brain angiography. Electronically Signed   By: Jasmine Pang M.D.   On: 11/19/2018 17:39   Ct Cervical Spine Wo Contrast  Result Date: 11/19/2018 CLINICAL DATA:  Fall with hip deformity EXAM: CT HEAD WITHOUT CONTRAST CT CERVICAL SPINE WITHOUT  CONTRAST TECHNIQUE: Multidetector CT imaging of the head and cervical spine was performed following the standard protocol without intravenous contrast. Multiplanar CT image reconstructions of the cervical spine were also generated. COMPARISON:  CT brain 09/02/2017, 01/31/2017, 01/10/2017 FINDINGS: CT HEAD FINDINGS Brain: No acute territorial infarction, hemorrhage or intracranial mass. Moderate periventricular, subcortical and deep white matter hypodensity consistent with chronic small vessel ischemic changes. Stable ventricle size. Vascular: No hyperdense vessels. Densely calcified cavernous carotid arteries. 11 mm soft tissue density with peripheral calcification at the right cavernous sinus. Skull: No fracture.  Mastoid sclerosis. Sinuses/Orbits: Mucosal thickening and cysts in the maxillary, sphenoid and ethmoid sinuses Other: None CT CERVICAL SPINE FINDINGS Alignment: Reversal of cervical lordosis. Trace anterolisthesis C2 on C3, C3 on C4 and C4 on C5. Trace retrolisthesis C6 on C7. Facet alignment within normal limits. Skull base and vertebrae: No acute fracture. No primary bone lesion or focal pathologic process. Soft tissues and spinal canal: No prevertebral fluid or swelling. No visible canal hematoma. Disc levels: Mild degenerative changes at C3-C4 and C4-C5 with advanced degenerative change at C5-C6 and C6-C7. Multiple level facet degenerative change. Upper chest: Negative. Other: None IMPRESSION: 1. No CT evidence for acute intracranial abnormality. Atrophy and small  vessel ischemic changes of the white matter. 2. Reversal of cervical lordosis with trace anterolisthesis C2 on C3, C3 on C4 and C4 on C5, probably degenerative. No fracture is seen. If ligamentous injury is a concern, MRI could be obtained. 3. 11 mm soft tissue density with peripheral calcification at the right cavernous sinus, possibly representing an aneurysm. This could be further evaluated with CT brain angiography. Electronically Signed    By: Donavan Foil M.D.   On: 11/19/2018 17:39   Dg Hip Operative Unilat W Or W/o Pelvis Left  Result Date: 11/20/2018 CLINICAL DATA:  Patient status post ORIF proximal left femur. EXAM: OPERATIVE LEFT HIP (WITH PELVIS IF PERFORMED) 3 VIEWS TECHNIQUE: Fluoroscopic spot image(s) were submitted for interpretation post-operatively. COMPARISON:  Hip radiograph 11/19/2018 FINDINGS: Patient status post I am nail fixation of proximal left femur fracture. Hardware appears intact. Fracture appears in improved anatomic alignment. IMPRESSION: Patient status post ORIF proximal left femur. Electronically Signed   By: Lovey Newcomer M.D.   On: 11/20/2018 12:28   Dg Hip Unilat W Or Wo Pelvis 2-3 Views Left  Result Date: 11/19/2018 CLINICAL DATA:  Fall EXAM: DG HIP (WITH OR WITHOUT PELVIS) 2-3V LEFT COMPARISON:  None. FINDINGS: Intertrochanteric left hip fracture with foreshortening and varus angulation. Mild degenerative changes of the bilateral hips. Visualized bony pelvis appears intact. Degenerative changes of the lower lumbar spine. IMPRESSION: Intertrochanteric left hip fracture, as above. Electronically Signed   By: Julian Hy M.D.   On: 11/19/2018 18:14        Scheduled Meds:  acetaminophen  500 mg Oral Q6H   buPROPion  100 mg Oral Daily   Chlorhexidine Gluconate Cloth  6 each Topical Daily   docusate sodium  100 mg Oral BID   donepezil  5 mg Oral QHS   enoxaparin (LOVENOX) injection  40 mg Subcutaneous Q24H   levothyroxine  50 mcg Oral BH-q7a   losartan  50 mg Oral BID   metoprolol succinate  25 mg Oral Daily   sertraline  100 mg Oral Daily   Continuous Infusions:  sodium chloride       LOS: 2 days    Time spent: 25-30 min    Ezekiel Slocumb, DO [facility] Pager: [phone]  If 7PM-7AM, please contact night-coverage [email or web address] Password [facility] 11/21/2018, 7:44 AM

## 2018-11-22 ENCOUNTER — Encounter: Payer: Self-pay | Admitting: Surgery

## 2018-11-22 LAB — CBC WITH DIFFERENTIAL/PLATELET
Abs Immature Granulocytes: 0.24 10*3/uL — ABNORMAL HIGH (ref 0.00–0.07)
Basophils Absolute: 0 10*3/uL (ref 0.0–0.1)
Basophils Relative: 0 %
Eosinophils Absolute: 0.2 10*3/uL (ref 0.0–0.5)
Eosinophils Relative: 2 %
HCT: 27.2 % — ABNORMAL LOW (ref 36.0–46.0)
Hemoglobin: 9 g/dL — ABNORMAL LOW (ref 12.0–15.0)
Immature Granulocytes: 2 %
Lymphocytes Relative: 7 %
Lymphs Abs: 0.7 10*3/uL (ref 0.7–4.0)
MCH: 30.1 pg (ref 26.0–34.0)
MCHC: 33.1 g/dL (ref 30.0–36.0)
MCV: 91 fL (ref 80.0–100.0)
Monocytes Absolute: 1 10*3/uL (ref 0.1–1.0)
Monocytes Relative: 9 %
Neutro Abs: 8.9 10*3/uL — ABNORMAL HIGH (ref 1.7–7.7)
Neutrophils Relative %: 80 %
Platelets: 234 10*3/uL (ref 150–400)
RBC: 2.99 MIL/uL — ABNORMAL LOW (ref 3.87–5.11)
RDW: 13.2 % (ref 11.5–15.5)
WBC: 11.2 10*3/uL — ABNORMAL HIGH (ref 4.0–10.5)
nRBC: 0 % (ref 0.0–0.2)

## 2018-11-22 LAB — BASIC METABOLIC PANEL
Anion gap: 9 (ref 5–15)
BUN: 22 mg/dL (ref 8–23)
CO2: 24 mmol/L (ref 22–32)
Calcium: 8.3 mg/dL — ABNORMAL LOW (ref 8.9–10.3)
Chloride: 99 mmol/L (ref 98–111)
Creatinine, Ser: 0.96 mg/dL (ref 0.44–1.00)
GFR calc Af Amer: >60 mL/min (ref >60)
GFR calc non Af Amer: 52 mL/min — ABNORMAL LOW (ref >60)
Glucose, Bld: 130 mg/dL — ABNORMAL HIGH (ref 70–99)
Potassium: 3.8 mmol/L (ref 3.5–5.1)
Sodium: 132 mmol/L — ABNORMAL LOW (ref 135–145)

## 2018-11-22 NOTE — Care Management Important Message (Signed)
Important Message  Patient Details  Name: Deborah Martinez MRN: [id number] Date of Birth: 1927/05/11   Medicare Important Message Given:  Yes     [name] 11/22/2018, 11:09 AM

## 2018-11-22 NOTE — Progress Notes (Signed)
  Subjective: 2 Days Post-Op Procedure(s) (LRB): INTRAMEDULLARY (IM) NAIL INTERTROCHANTRIC (Left) Patient reports pain as mild in the left hip this morning. Patient is alert this morning but does not remember that she had surgery on her left hip. Plan is to go Skilled nursing facility after hospital stay. Negative for chest pain and shortness of breath Fever: no Gastrointestinal: Negative for nausea and vomiting  Objective: Vital signs in last 24 hours: Temp:  [98.4 F (36.9 C)-98.8 F (37.1 C)] 98.5 F (36.9 C) (11/09 0356) Pulse Rate:  [70-93] 87 (11/09 0356) Resp:  [13-18] 13 (11/09 0356) BP: (99-139)/(64-81) 122/81 (11/09 0356) SpO2:  [84 %-100 %] 84 % (11/09 0356)  Intake/Output from previous day:  Intake/Output Summary (Last 24 hours) at 11/22/2018 0743 Last data filed at 11/22/2018 0400 Gross per 24 hour  Intake 1254.92 ml  Output 1175 ml  Net 79.92 ml    Intake/Output this shift: No intake/output data recorded.  Labs: Recent Labs    11/19/18 1707 11/21/18 0524 11/22/18 0416  HGB 12.1 10.4* 9.0*   Recent Labs    11/21/18 0524 11/22/18 0416  WBC 15.4* 11.2*  RBC 3.34* 2.99*  HCT 29.9* 27.2*  PLT 274 234   Recent Labs    11/21/18 0524 11/22/18 0416  NA 134* 132*  K 3.7 3.8  CL 97* 99  CO2 24 24  BUN 18 22  CREATININE 1.04* 0.96  GLUCOSE 126* 130*  CALCIUM 8.1* 8.3*   Recent Labs    11/19/18 1707  INR 1.5*     EXAM General - Patient is Alert but is confused this morning. Extremity - ABD soft Neurovascular intact Sensation intact distally Intact pulses distally Dorsiflexion/Plantar flexion intact Compartment soft Dressing/Incision - clean, dry, blood tinged drainage at the proximal incision, distal incisions are clean. Motor Function - intact, moving foot and toes well on exam.   Past Medical History:  Diagnosis Date  . Anxiety   . Constipation   . Cystocele   . Depression   . Hemorrhoid   . Hypercholesterolemia   .  Hypertension   . Incomplete bladder emptying   . Postmenopausal atrophic vaginitis   . Rectocele   . Thyroid disease   . UTI (lower urinary tract infection)   . Vaginal enterocele     Assessment/Plan: 2 Days Post-Op Procedure(s) (LRB): INTRAMEDULLARY (IM) NAIL INTERTROCHANTRIC (Left) Principal Problem:   Femur fracture, left (HCC) Active Problems:   Anxiety and depression   Hypertension   Dizziness   Hypothyroid   Protein calorie malnutrition (HCC)   AF (paroxysmal atrial fibrillation) (HCC)  Estimated body mass index is 21.79 kg/m as calculated from the following:   Height as of this encounter: 5\' 6"  (1.676 m).   Weight as of this encounter: 61.2 kg. Advance diet Up with therapy   Labs reviewed this AM, Hg 9.0 Confused this AM, does not remember her surgery. Monitor confusion this morning, cut back on pain medicine. Patient has had a BM. Continue with PT today. Plan is for discharge to SNF when appropriate.  DVT Prophylaxis - Lovenox, Foot Pumps and TED hose Weight-Bearing as tolerated to left leg  J. Cameron Proud, PA-C Orthopaedic Surgery 11/22/2018, 7:43 AM

## 2018-11-22 NOTE — Progress Notes (Addendum)
PROGRESS NOTE    Deborah Martinez  GDJ:[id number] DOB: 08/02/1927 DOA: 11/19/2018  PCP: Maryland Pink, MD    LOS - 3   Brief Narrative:  85 y.o.femalewith history ofparoxysmal atrial fibrillation on Eliquis, hypertension, bilateral carotid artery stenosis, anxiety depression who presents status post a fall.Son reported hearing patient fall at home, but did not witness it.Noloss of consciousness.She has unsteady gaitat baseline,family has to remind her to use a walker.In the ED, vitals notable for hypertension 190's/110. Labs unremarkable. Chest xray, CT's of head and C-spine negative for acute findings. Hip xray did show left intertrochanteric hip fracture. Orthopedics was consulted and plan for surgery 11/7.   Subjective 11/9: Patient seen, resting in bed but awoke easily.  Reports some back and hip pain, but it's eased up some since getting pain medicine.  Yesterday had reported some trouble breathing which she reports is resolved today.  States she wants to get up out of bed, PT eval pending.  No acute events reported overnight.  Assessment & Plan:   Principal Problem:   Femur fracture, left (HCC) Active Problems:   Anxiety and depression   Hypertension   Dizziness   Hypothyroid   Protein calorie malnutrition (HCC)   AF (paroxysmal atrial fibrillation) ([place])   Fall - unclear if mechanical vs dizziness or balance issue. No LOC or head/neck injuries. Will check orthostatics (pending), TSH normal. PT evaluation, recommend SNF.  Case management following.  Left Intertrochanteric Hip Fracture-status post surgery on 11/7 -Ortho following - pain control - bowel regimen - PT eval post-op, recommend SNF  Leukocytosis -likely reactive due to fracture and surgery.  No evidence of infection at this time patient afebrile and without symptomatic complaints. -Follow CBC  Acute blood loss anemia, mild -secondary to surgery.  Hemoglobin 9.0 from, 10.4 yesterday.  No  evidence of active bleeding.  Follow CBC  Paroxysmal A-fib- CHADS2VASC2 score 4. Rate controlled. Eliquis held for surgery, now resumed. Continue metoprolol.  Hypertension - continue losartan  Dementia - continue Aricept  Depression with Anxiety - continue wellbutrin and zoloft  Hypothyroidism - continue Synthroid  Protein Calorie Malnutrition - dietician consulted   DVT prophylaxis: Eliquis, foot pumps and TED hose Code Status: Full Code Family Communication:none at bedside Disposition Plan:To SNF rehab, hopefully tomorrow.  Repeat Covid test pending.   Consultants:  Orthopedics  Procedures:  Surgery today  Antimicrobials:  none  Objective: Vitals:   11/21/18 1715 11/21/18 2027 11/22/18 0356 11/22/18 0800  BP: 99/64 104/68 122/81 123/84  Pulse:  82 87 92  Resp:   13 15  Temp:  98.8 F (37.1 C) 98.5 F (36.9 C) 98.7 F (37.1 C)  TempSrc:  Oral Oral Oral  SpO2:  91% (!) 84% 95%  Weight:      Height:        Intake/Output Summary (Last 24 hours) at 11/22/2018 1452 Last data filed at 11/22/2018 1300 Gross per 24 hour  Intake 1549.7 ml  Output 375 ml  Net 1174.7 ml   Filed Weights   11/19/18 1658  Weight: 61.2 kg    Examination:  General exam: awake, alert, no acute distress HEENT: moist mucus membranes, hearing grossly normal  Respiratory system: clear to auscultation bilaterally, no wheezes, rales or rhonchi, normal respiratory effort. Cardiovascular system: normal S1/S2, RRR, no JVD, murmurs, rubs, gallops, no pedal edema.   Gastrointestinal system: soft, non-tender, non-distended abdomen, normal bowel sounds. Central nervous system: alert and oriented x4. no gross focal neurologic deficits, normal speech Extremities: no cyanosis,  normal tone, moves all Skin: dry, intact, normal temperature Psychiatry: normal mood, congruent affect, judgement and insight appear normal    Data Reviewed: I have personally reviewed  following labs and imaging studies  CBC: Recent Labs  Lab 11/19/18 1707 11/21/18 0524 11/22/18 0416  WBC 7.6 15.4* 11.2*  NEUTROABS 5.2 12.8* 8.9*  HGB 12.1 10.4* 9.0*  HCT 34.9* 29.9* 27.2*  MCV 88.4 89.5 91.0  PLT 330 274 234   Basic Metabolic Panel: Recent Labs  Lab 11/19/18 1707 11/21/18 0524 11/22/18 0416  NA 136 134* 132*  K 4.2 3.7 3.8  CL 101 97* 99  CO2 24 24 24   GLUCOSE 116* 126* 130*  BUN 17 18 22   CREATININE 0.90 1.04* 0.96  CALCIUM 8.9 8.1* 8.3*   GFR: Estimated Creatinine Clearance: 37.2 mL/min (by C-G formula based on SCr of 0.96 mg/dL). Liver Function Tests: No results for input(s): AST, ALT, ALKPHOS, BILITOT, PROT, ALBUMIN in the last 168 hours. No results for input(s): LIPASE, AMYLASE in the last 168 hours. No results for input(s): AMMONIA in the last 168 hours. Coagulation Profile: Recent Labs  Lab 11/19/18 1707  INR 1.5*   Cardiac Enzymes: No results for input(s): CKTOTAL, CKMB, CKMBINDEX, TROPONINI in the last 168 hours. BNP (last 3 results) No results for input(s): PROBNP in the last 8760 hours. HbA1C: No results for input(s): HGBA1C in the last 72 hours. CBG: No results for input(s): GLUCAP in the last 168 hours. Lipid Profile: No results for input(s): CHOL, HDL, LDLCALC, TRIG, CHOLHDL, LDLDIRECT in the last 72 hours. Thyroid Function Tests: Recent Labs    11/20/18 0125  TSH 2.162   Anemia Panel: No results for input(s): VITAMINB12, FOLATE, FERRITIN, TIBC, IRON, RETICCTPCT in the last 72 hours. Sepsis Labs: No results for input(s): PROCALCITON, LATICACIDVEN in the last 168 hours.  Recent Results (from the past 240 hour(s))  SARS CORONAVIRUS 2 (TAT 6-24 HRS) Nasopharyngeal Nasopharyngeal Swab     Status: None   Collection Time: 11/19/18  6:11 PM   Specimen: Nasopharyngeal Swab  Result Value Ref Range Status   SARS Coronavirus 2 NEGATIVE NEGATIVE Final    Comment: (NOTE) SARS-CoV-2 target nucleic acids are NOT DETECTED. The  SARS-CoV-2 RNA is generally detectable in upper and lower respiratory specimens during the acute phase of infection. Negative results do not preclude SARS-CoV-2 infection, do not rule out co-infections with other pathogens, and should not be used as the sole basis for treatment or other patient management decisions. Negative results must be combined with clinical observations, patient history, and epidemiological information. The expected result is Negative. Fact Sheet for Patients: 13/07/20 Fact Sheet for Healthcare Providers: 13/06/20 This test is not yet approved or cleared by the [place] FDA and  has been authorized for detection and/or diagnosis of SARS-CoV-2 by FDA under an Emergency Use Authorization (EUA). This EUA will remain  in effect (meaning this test can be used) for the duration of the COVID-19 declaration under Section 56 4(b)(1) of the Act, 21 U.S.C. section 360bbb-3(b)(1), unless the authorization is terminated or revoked sooner. Performed at [facility], [address], [place], [address] [place]          Radiology Studies: No results found.      Scheduled Meds:  apixaban  5 mg Oral BID   buPROPion  100 mg Oral Daily   Chlorhexidine Gluconate Cloth  6 each Topical Daily   docusate sodium  100 mg Oral BID   donepezil  5 mg  Oral QHS   feeding supplement (ENSURE ENLIVE)  237 mL Oral BID BM   levothyroxine  50 mcg Oral BH-q7a   losartan  50 mg Oral BID   metoprolol succinate  25 mg Oral Daily   multivitamin with minerals  1 tablet Oral Daily   sertraline  100 mg Oral Daily   Continuous Infusions:  sodium chloride 50 mL/hr at 11/21/18 1039     LOS: 3 days    Time spent: 25-30 min    Pennie BanterKelly A Osinachi Navarrette, DO [facility] Pager: [phone]  If 7PM-7AM, please contact night-coverage [email or web address] Password [email or web address] 11/22/2018, 2:52 PM

## 2018-11-22 NOTE — Progress Notes (Signed)
Physical Therapy Treatment Patient Details Name: Deborah Martinez MRN: [id number] DOB: [birth year] Today's Date: 11/22/2018    History of Present Illness Deborah Martinez is a 85 y.o. female with medical history significant of paroxysmal atrial fibrillation on Eliquis, hypertension, bilateral carotid artery stenosis, anxiety depression who presents status post a fall.Hip xray did show left intertrochanteric hip fracture.No S/P INTRAMEDULLARY (IM) NAIL INTERTROCHANTRIC (Left)    PT Comments    Gradual progression in gait distance, tolerating up to 20' at a time, cga/min assist.  Min cuing for gait pattern and walker sequencing to maximize offloading to L LE.   Follow Up Recommendations  SNF     Equipment Recommendations       Recommendations for Other Services       Precautions / Restrictions Precautions Precautions: Fall Restrictions Weight Bearing Restrictions: Yes LLE Weight Bearing: Weight bearing as tolerated    Mobility  Bed Mobility Overal bed mobility: Needs Assistance Bed Mobility: Supine to Sit;Sit to Supine     Supine to sit: Mod assist Sit to supine: Mod assist   General bed mobility comments: assist for LE management, truncal elevation  Transfers Overall transfer level: Needs assistance Equipment used: Rolling walker (2 wheeled) Transfers: Sit to/from Stand Sit to Stand: Min guard;Min assist         General transfer comment: tends to pull on RW, excessive weight shift to R LE  Ambulation/Gait Ambulation/Gait assistance: Min guard Gait Distance (Feet): (20' x2) Assistive device: Rolling walker (2 wheeled)       General Gait Details: verbal cuing for gait pattern to decrease pain in L LE; very slow and guarded, heavy WBing bilat UEs.  Fatigues quickly and requires seated rest period after 20'; further limited by pain.   Stairs             Wheelchair Mobility    Modified Rankin (Stroke Patients Only)       Balance Overall balance  assessment: Needs assistance Sitting-balance support: No upper extremity supported;Feet supported Sitting balance-Leahy Scale: Good     Standing balance support: Bilateral upper extremity supported Standing balance-Leahy Scale: Fair                              Cognition Arousal/Alertness: Awake/alert Behavior During Therapy: WFL for tasks assessed/performed Overall Cognitive Status: No family/caregiver present to determine baseline cognitive functioning                                 General Comments: oriented to self, location and general situation; follows simple commands; pleasant and cooperative      Exercises Other Exercises Other Exercises: Sit/stand from edge of bed, recliner with RW, cga/min assist.    General Comments        Pertinent Vitals/Pain Pain Assessment: Faces Faces Pain Scale: Hurts even more Pain Location: L hip Pain Descriptors / Indicators: Aching;Guarding;Grimacing Pain Intervention(s): Limited activity within patient's tolerance;Monitored during session;Repositioned    Home Living                      Prior Function            PT Goals (current goals can now be found in the care plan section) Acute Rehab PT Goals Patient Stated Goal: to walk PT Goal Formulation: With patient Time For Goal Achievement: 12/05/18 Potential to Achieve Goals: Good Progress towards  PT goals: Progressing toward goals    Frequency    BID      PT Plan Current plan remains appropriate    Co-evaluation              AM-PAC PT "6 Clicks" Mobility   Outcome Measure  Help needed turning from your back to your side while in a flat bed without using bedrails?: A Lot Help needed moving from lying on your back to sitting on the side of a flat bed without using bedrails?: A Lot Help needed moving to and from a bed to a chair (including a wheelchair)?: A Little Help needed standing up from a chair using your arms (e.g.,  wheelchair or bedside chair)?: A Little Help needed to walk in hospital room?: A Little Help needed climbing 3-5 steps with a railing? : A Lot 6 Click Score: 15    End of Session Equipment Utilized During Treatment: Gait belt Activity Tolerance: Patient limited by fatigue;Patient limited by pain Patient left: in bed;with call bell/phone within reach;with bed alarm set Nurse Communication: Mobility status PT Visit Diagnosis: Unsteadiness on feet (R26.81);Muscle weakness (generalized) (M62.81);History of falling (Z91.81);Difficulty in walking, not elsewhere classified (R26.2);Pain Pain - Right/Left: Left Pain - part of body: Hip     Time: 3419-3790 PT Time Calculation (min) (ACUTE ONLY): 23 min  Charges:  $Gait Training: 8-22 mins $Therapeutic Activity: 8-22 mins                     Jeray Shugart H. Manson Passey, PT, DPT, NCS 11/22/18, 4:50 PM [phone]

## 2018-11-22 NOTE — TOC Progression Note (Signed)
Transition of Care [facility]) - Progression Note    Patient Details  Name: Deborah Martinez MRN: [id number] Date of Birth: 1927/06/11  Transition of Care [facility]) CM/SW Lor[place], RN Phone Number: 11/22/2018, 4:12 PM  Clinical Narrative:    Spoke with the patient and called the Prudencio Burly on the phone and discussed the bed offer from [facility].  They both accepted the bed offer.  [facility] of [facility]   Expected Discharge Plan: Skilled Nursing Facility Barriers to Discharge: Unsafe home situation(per patient's son, her husband who was her primary caretaker has now been hospitalized following a fall)  Expected Discharge Plan and Services Expected Discharge Plan: [place] In-house Referral: Clinical Social Work Discharge Planning Services: (P) CM Consult Post Acute Care Choice: Skilled Nursing Facility(patient's son would like long term care in a memory care unit following rehab) Living arrangements for the past 2 months: ([facility])                                       Social Determinants of Health (SDOH) Interventions    Readmission Risk Interventions No flowsheet data found.

## 2018-11-22 NOTE — Progress Notes (Signed)
Physical Therapy Treatment Patient Details Name: Deborah Martinez MRN: [id number] DOB: [birth year] Today's Date: 11/22/2018    History of Present Illness Deborah Martinez is a 85 y.o. female with medical history significant of paroxysmal atrial fibrillation on Eliquis, hypertension, bilateral carotid artery stenosis, anxiety depression who presents status post a fall.Hip xray did show left intertrochanteric hip fracture.No S/P INTRAMEDULLARY (IM) NAIL INTERTROCHANTRIC (Left)    PT Comments    Performing all sit/stand, basic transfers and short-distance gait (5' x2) with less physical assist this date, min assist +1.  Slow and guarded, limited by pain (meds administered just prior to session).  Declined additional gait distance this AM; will progress as able this PM.   Follow Up Recommendations  SNF     Equipment Recommendations  None recommended by PT    Recommendations for Other Services       Precautions / Restrictions Precautions Precautions: Fall Restrictions Weight Bearing Restrictions: Yes LLE Weight Bearing: Weight bearing as tolerated    Mobility  Bed Mobility Overal bed mobility: Needs Assistance Bed Mobility: Supine to Sit     Supine to sit: Min assist;Mod assist     General bed mobility comments: assist for LE management, truncal elevation  Transfers Overall transfer level: Needs assistance Equipment used: Rolling walker (2 wheeled) Transfers: Sit to/from Stand Sit to Stand: Min assist         General transfer comment: cuing for hand placement and overall safety; fair use of L LE  Ambulation/Gait Ambulation/Gait assistance: Min assist Gait Distance (Feet): (5x2) Assistive device: Rolling walker (2 wheeled)       General Gait Details: 3-point, step to gait pattern; decreased stance time/weight shift to L LE.  Slow and guarded; additional distance limited by pain.   Stairs             Wheelchair Mobility    Modified Rankin (Stroke Patients  Only)       Balance Overall balance assessment: Needs assistance Sitting-balance support: No upper extremity supported;Feet supported Sitting balance-Leahy Scale: Good     Standing balance support: Bilateral upper extremity supported Standing balance-Leahy Scale: Fair                              Cognition Arousal/Alertness: Awake/alert Behavior During Therapy: WFL for tasks assessed/performed Overall Cognitive Status: No family/caregiver present to determine baseline cognitive functioning                                 General Comments: oriented to self, location and general situation; follows simple commands; pleasant and cooperative      Exercises Other Exercises Other Exercises: Supine LE therex, 1x10, act assist ROM: ankle pumps, quad sets, SAQs, heel slides, hip abduct/adduct.  Act assist throughout all ranges due to pain. Other Exercises: Toilet transfer, SPT to/from [facility], cga/min assist. Min cuing for walker management, sequence.    General Comments        Pertinent Vitals/Pain Pain Assessment: Faces Faces Pain Scale: Hurts whole lot Pain Location: L hip Pain Descriptors / Indicators: Aching;Guarding;Grimacing Pain Intervention(s): Limited activity within patient's tolerance;Monitored during session;Repositioned    Home Living                      Prior Function            PT Goals (current goals can now be found  in the care plan section) Acute Rehab PT Goals Patient Stated Goal: to walk PT Goal Formulation: With patient Time For Goal Achievement: 12/05/18 Potential to Achieve Goals: Good Progress towards PT goals: Progressing toward goals    Frequency    BID      PT Plan Current plan remains appropriate    Co-evaluation              AM-PAC PT "6 Clicks" Mobility   Outcome Measure  Help needed turning from your back to your side while in a flat bed without using bedrails?: A Lot Help needed moving  from lying on your back to sitting on the side of a flat bed without using bedrails?: A Lot Help needed moving to and from a bed to a chair (including a wheelchair)?: A Little Help needed standing up from a chair using your arms (e.g., wheelchair or bedside chair)?: A Little Help needed to walk in hospital room?: A Little Help needed climbing 3-5 steps with a railing? : A Lot 6 Click Score: 15    End of Session Equipment Utilized During Treatment: Gait belt   Patient left: in bed;with call bell/phone within reach;with bed alarm set Nurse Communication: Mobility status PT Visit Diagnosis: Unsteadiness on feet (R26.81);Muscle weakness (generalized) (M62.81);History of falling (Z91.81);Difficulty in walking, not elsewhere classified (R26.2);Pain Pain - Right/Left: Left Pain - part of body: Hip     Time: 3790-2409 PT Time Calculation (min) (ACUTE ONLY): 27 min  Charges:  $Therapeutic Exercise: 8-22 mins $Therapeutic Activity: 8-22 mins                     Chayil Gantt H. Owens Shark, PT, DPT, NCS 11/22/18, 10:20 AM [phone]

## 2018-11-22 NOTE — Plan of Care (Signed)

## 2018-11-23 LAB — BASIC METABOLIC PANEL
Anion gap: 9 (ref 5–15)
BUN: 27 mg/dL — ABNORMAL HIGH (ref 8–23)
CO2: 25 mmol/L (ref 22–32)
Calcium: 8.6 mg/dL — ABNORMAL LOW (ref 8.9–10.3)
Chloride: 98 mmol/L (ref 98–111)
Creatinine, Ser: 0.97 mg/dL (ref 0.44–1.00)
GFR calc Af Amer: >60 mL/min (ref >60)
GFR calc non Af Amer: 52 mL/min — ABNORMAL LOW (ref >60)
Glucose, Bld: 153 mg/dL — ABNORMAL HIGH (ref 70–99)
Potassium: 3.9 mmol/L (ref 3.5–5.1)
Sodium: 132 mmol/L — ABNORMAL LOW (ref 135–145)

## 2018-11-23 LAB — CBC
HCT: 28 % — ABNORMAL LOW (ref 36.0–46.0)
Hemoglobin: 9.7 g/dL — ABNORMAL LOW (ref 12.0–15.0)
MCH: 30.5 pg (ref 26.0–34.0)
MCHC: 34.6 g/dL (ref 30.0–36.0)
MCV: 88.1 fL (ref 80.0–100.0)
Platelets: 290 10*3/uL (ref 150–400)
RBC: 3.18 MIL/uL — ABNORMAL LOW (ref 3.87–5.11)
RDW: 13.3 % (ref 11.5–15.5)
WBC: 11.7 10*3/uL — ABNORMAL HIGH (ref 4.0–10.5)
nRBC: 0.3 % — ABNORMAL HIGH (ref 0.0–0.2)

## 2018-11-23 LAB — SARS CORONAVIRUS 2 (TAT 6-24 HRS): SARS Coronavirus 2: NEGATIVE

## 2018-11-23 MED ORDER — DOCUSATE SODIUM 100 MG PO CAPS: 100.0000 mg | ORAL_CAPSULE | Freq: Two times a day (BID) | ORAL | 0 refills | Status: AC

## 2018-11-23 MED ORDER — HYDROCODONE-ACETAMINOPHEN 5-325 MG PO TABS: 1.0000 | ORAL_TABLET | ORAL | 0 refills | Status: DC | PRN

## 2018-11-23 MED ORDER — ONDANSETRON HCL 4 MG PO TABS: 4.0000 mg | ORAL_TABLET | Freq: Four times a day (QID) | ORAL | 0 refills | Status: DC | PRN

## 2018-11-23 MED ORDER — ENSURE ENLIVE PO LIQD: 237.0000 mL | Freq: Two times a day (BID) | ORAL | 12 refills | Status: AC

## 2018-11-23 MED ORDER — SENNOSIDES-DOCUSATE SODIUM 8.6-50 MG PO TABS: 1.0000 | ORAL_TABLET | Freq: Every evening | ORAL | Status: DC | PRN

## 2018-11-23 MED ORDER — ACETAMINOPHEN 325 MG PO TABS: 650.0000 mg | ORAL_TABLET | Freq: Four times a day (QID) | ORAL | Status: AC | PRN

## 2018-11-23 MED ORDER — CALCIUM CARBONATE ANTACID 500 MG PO CHEW: 2.0000 | CHEWABLE_TABLET | Freq: Two times a day (BID) | ORAL | Status: DC | PRN

## 2018-11-23 NOTE — Progress Notes (Addendum)
  Subjective: 3 Days Post-Op Procedure(s) (LRB): INTRAMEDULLARY (IM) NAIL INTERTROCHANTRIC (Left) Patient reports pain as mild in the left hip this morning. Patient seems more alert this morning, resting comfortably. Plan is to go Skilled nursing facility after hospital stay.  Possibly today. Negative for chest pain and shortness of breath Fever: no Gastrointestinal: Negative for nausea and vomiting  Objective: Vital signs in last 24 hours: Temp:  [97.8 F (36.6 C)-98.4 F (36.9 C)] 98.4 F (36.9 C) (11/10 0749) Pulse Rate:  [76-82] 77 (11/10 0749) Resp:  [16-17] 16 (11/10 0749) BP: (117-153)/(71-81) 153/79 (11/10 0749) SpO2:  [94 %-96 %] 95 % (11/10 0749)  Intake/Output from previous day:  Intake/Output Summary (Last 24 hours) at 11/23/2018 0928 Last data filed at 11/22/2018 1700 Gross per 24 hour  Intake 480 ml  Output -  Net 480 ml    Intake/Output this shift: No intake/output data recorded.  Labs: Recent Labs    11/21/18 0524 11/22/18 0416 11/23/18 0355  HGB 10.4* 9.0* 9.7*   Recent Labs    11/22/18 0416 11/23/18 0355  WBC 11.2* 11.7*  RBC 2.99* 3.18*  HCT 27.2* 28.0*  PLT 234 290   Recent Labs    11/22/18 0416 11/23/18 0355  NA 132* 132*  K 3.8 3.9  CL 99 98  CO2 24 25  BUN 22 27*  CREATININE 0.96 0.97  GLUCOSE 130* 153*  CALCIUM 8.3* 8.6*   No results for input(s): LABPT, INR in the last 72 hours.   EXAM General - Patient is Alert and Appropriate Extremity - ABD soft Neurovascular intact Sensation intact distally Intact pulses distally Dorsiflexion/Plantar flexion intact Compartment soft Dressing/Incision - clean, dry, blood tinged drainage at the proximal incision, distal incisions are clean. Motor Function - intact, moving foot and toes well on exam.   Past Medical History:  Diagnosis Date  . Anxiety   . Constipation   . Cystocele   . Depression   . Hemorrhoid   . Hypercholesterolemia   . Hypertension   . Incomplete bladder  emptying   . Postmenopausal atrophic vaginitis   . Rectocele   . Thyroid disease   . UTI (lower urinary tract infection)   . Vaginal enterocele     Assessment/Plan: 3 Days Post-Op Procedure(s) (LRB): INTRAMEDULLARY (IM) NAIL INTERTROCHANTRIC (Left) Principal Problem:   Femur fracture, left (HCC) Active Problems:   Anxiety and depression   Hypertension   Dizziness   Hypothyroid   Protein calorie malnutrition (HCC)   AF (paroxysmal atrial fibrillation) (HCC)  Estimated body mass index is 21.79 kg/m as calculated from the following:   Height as of this encounter: 5\' 6"  (1.676 m).   Weight as of this encounter: 61.2 kg. Advance diet Up with therapy   Labs reviewed this AM, Hg 9.7 this morning. Patient has had a BM. Continue with PT today. Plan is for discharge to SNF, possibly today.  Staples can be removed on 12/03/18 by SNF. Follow-up with [facility] in 6 weeks for x-rays of the left femur. Continue Eliquis upon discharge.  DVT Prophylaxis - Foot Pumps, TED hose and Eliquis Weight-Bearing as tolerated to left leg  J. Cameron Proud, PA-C Orthopaedic Surgery 11/23/2018, 9:28 AM

## 2018-11-23 NOTE — Progress Notes (Signed)
Physical Therapy Treatment Patient Details Name: Deborah Martinez MRN: [id number] DOB: [birth year] Today's Date: 11/23/2018    History of Present Illness Deborah Martinez is a 85 y.o. female with medical history significant of paroxysmal atrial fibrillation on Eliquis, hypertension, bilateral carotid artery stenosis, anxiety depression who presents status post a fall.Hip xray did show left intertrochanteric hip fracture.No S/P INTRAMEDULLARY (IM) NAIL INTERTROCHANTRIC (Left)    PT Comments    Pt in bed, eyes closed initially.  Supine AAROM for LLE ankle pumps, heel slides, ab/add to awaken.  To edge of bed with min a x 1 and she was able to walk to bathroom then after voiding, continue on to door and back to recliner with RW and min guard/assist +1.    Pt initially declines pain but then c/o pain.  She declines needing to use bathroom but shortly after stated she needed to.  Asks frequently during gait about pain and how long it will last.    SNF remains appropriate upon discharge.   Follow Up Recommendations  SNF     Equipment Recommendations  Rolling walker with 5" wheels    Recommendations for Other Services       Precautions / Restrictions Precautions Precautions: Fall Restrictions LLE Weight Bearing: Weight bearing as tolerated    Mobility  Bed Mobility Overal bed mobility: Needs Assistance Bed Mobility: Supine to Sit     Supine to sit: Min assist        Transfers Overall transfer level: Needs assistance Equipment used: Rolling walker (2 wheeled) Transfers: Sit to/from Stand Sit to Stand: Min guard;Min assist            Ambulation/Gait Ambulation/Gait assistance: Min guard;Min assist Gait Distance (Feet): 40 Feet Assistive device: Rolling walker (2 wheeled) Gait Pattern/deviations: Step-to pattern Gait velocity: decreaseed       Stairs             Wheelchair Mobility    Modified Rankin (Stroke Patients Only)       Balance Overall balance  assessment: Needs assistance Sitting-balance support: No upper extremity supported;Feet supported Sitting balance-Leahy Scale: Good     Standing balance support: Bilateral upper extremity supported Standing balance-Leahy Scale: Fair                              Cognition Arousal/Alertness: Awake/alert Behavior During Therapy: WFL for tasks assessed/performed Overall Cognitive Status: No family/caregiver present to determine baseline cognitive functioning                                 General Comments: demonstrated some inconsistancies with answers but pleasant and cooperative      Exercises Other Exercises Other Exercises: to commode to void    General Comments        Pertinent Vitals/Pain Pain Assessment: Faces Faces Pain Scale: Hurts little more Pain Location: L hip Pain Descriptors / Indicators: Aching;Guarding;Grimacing Pain Intervention(s): Limited activity within patient's tolerance;Monitored during session;Patient requesting pain meds-RN notified;Repositioned    Home Living                      Prior Function            PT Goals (current goals can now be found in the care plan section) Progress towards PT goals: Progressing toward goals    Frequency    BID  PT Plan Current plan remains appropriate    Co-evaluation              AM-PAC PT "6 Clicks" Mobility   Outcome Measure  Help needed turning from your back to your side while in a flat bed without using bedrails?: A Little Help needed moving from lying on your back to sitting on the side of a flat bed without using bedrails?: A Little Help needed moving to and from a bed to a chair (including a wheelchair)?: A Little Help needed standing up from a chair using your arms (e.g., wheelchair or bedside chair)?: A Little Help needed to walk in hospital room?: A Little Help needed climbing 3-5 steps with a railing? : A Lot 6 Click Score: 17    End of  Session Equipment Utilized During Treatment: Gait belt Activity Tolerance: Patient tolerated treatment well;Patient limited by pain Patient left: in chair;with call bell/phone within reach;with chair alarm set;with nursing/sitter in room Nurse Communication: Mobility status;Patient requests pain meds Pain - Right/Left: Left Pain - part of body: Hip     Time: 1610-9604 PT Time Calculation (min) (ACUTE ONLY): 18 min  Charges:  $Gait Training: 8-22 mins                     Danielle Dess, PTA 11/23/18, 11:15 AM

## 2018-11-23 NOTE — Progress Notes (Signed)
Patient picked up by Ems, Requested TUMS before departure given PO Tums for c/o heartburn ... Report given to EMS and [facility] place. IV removed RFA . VSS. Transferred via stretcher without incident

## 2018-11-23 NOTE — Discharge Summary (Signed)
Physician Discharge Summary  Deborah LenzBetty W Martinez ZOX:[id number] DOB: Aug 28, 1927 DOA: 11/19/2018  PCP: Deborah MinaHedrick, James, MD  Admit date: 11/19/2018 Discharge date: 11/23/2018  Admitted From: Home Disposition:  SNF  Recommendations for Outpatient Follow-up:  1. Follow up with PCP in 1-2 weeks 2. Please obtain BMP/CBC in one week 3. Please follow with orthopedic surgeon in 6 weeks  Home Health: No  Equipment/Devices: None  Discharge Condition: Stable CODE STATUS: Full Diet recommendation: Regular  Brief/Interim Summary:  85 y.o.femalewith history ofparoxysmal atrial fibrillation on Eliquis, hypertension, bilateral carotid artery stenosis, anxiety depression who presents status post a fall.Son reported hearing patient fall at home, but did not witness it.Noloss of consciousness.She has unsteady gaitat baseline,family has to remind her to use a walker.In the ED, vitals notable for hypertension 190's/110. Labs unremarkable. Chest xray, CT's of head and C-spine negative for acute findings. Hip xray did show left intertrochanteric hip fracture. Orthopedics was consulted and patient underwent surgeryon 11/7. Physical therapy evaluated patient post-operatively and recommend SNF for rehab.  Patient is clinically improved and stable for discharge today.   Discharge Diagnoses: Principal Problem:   Femur fracture, left (HCC) Active Problems:   Anxiety and depression   Hypertension   Dizziness   Hypothyroid   Protein calorie malnutrition (HCC)   AF (paroxysmal atrial fibrillation) (HCC)  Fall - unclear if mechanical vs dizziness or balance issue. No LOC or head/neck injuries. Will check orthostatics(pending), TSHnormal. PT evaluation, recommend SNF.Case management following.  Left Intertrochanteric Hip Fracture-status post surgery on 11/7 -Ortho following - pain control - bowel regimen - PT eval post-op, recommend SNF  Leukocytosis-likely reactive due to fracture and  surgery. No evidence of infection at this time patient afebrile and without symptomatic complaints. -Follow CBC  Acute blood loss anemia, mild-secondary to surgery. Hemoglobin 9.0 from, 10.4 yesterday. No evidence of active bleeding. Follow CBC  Paroxysmal A-fib- CHADS2VASC2 score 4. Rate controlled. Eliquis held for surgery, now resumed. Continue metoprolol.  Hypertension - continue losartan  Dementia - continue Aricept  Depression with Anxiety - continue wellbutrin and zoloft  Hypothyroidism - continue Synthroid  Protein Calorie Malnutrition - dietician consulted  Discharge Instructions   Discharge Instructions    Call MD for:  redness, tenderness, or signs of infection (pain, swelling, redness, odor or green/yellow discharge around incision site)   Complete by: As directed    Call MD for:  severe uncontrolled pain   Complete by: As directed    Call MD for:  temperature >100.4   Complete by: As directed    Diet - low sodium heart healthy   Complete by: As directed    Discharge instructions   Complete by: As directed    Per Orthopedic Surgeon: - staples may be removed at SNF on 12/03/18 - weight-bearing as tolerated on left lower extremity - [facility] - continue Eliquis twice daily   Increase activity slowly   Complete by: As directed      Allergies as of 11/23/2018   No Known Allergies     Medication List    STOP taking these medications   amLODipine 2.5 MG tablet Commonly known as: NORVASC   cephALEXin 500 MG capsule Commonly known as: KEFLEX   Trimo-San 0.025-0.01 % Gel Generic drug: Oxyquinoline-Sod Lauryl Sulf     TAKE these medications   acetaminophen 325 MG tablet Commonly known as: TYLENOL Take 2 tablets (650 mg total) by mouth every 6 (six) hours as needed for mild pain or fever.  alendronate 70 MG tablet Commonly known as: FOSAMAX Take 70 mg by mouth once a week. Take with a full glass of  water on an empty stomach.   buPROPion 100 MG 12 hr tablet Commonly known as: WELLBUTRIN SR Take 100 mg by mouth daily.   calcium carbonate 500 MG chewable tablet Commonly known as: TUMS - dosed in mg elemental calcium Chew 2 tablets (400 mg of elemental calcium total) by mouth 2 (two) times daily as needed for indigestion or heartburn.   docusate sodium 100 MG capsule Commonly known as: COLACE Take 1 capsule (100 mg total) by mouth 2 (two) times daily.   donepezil 5 MG tablet Commonly known as: ARICEPT Take 5 mg by mouth at bedtime.   Eliquis 5 MG Tabs tablet Generic drug: apixaban Take 5 mg by mouth 2 (two) times daily.   feeding supplement (ENSURE ENLIVE) Liqd Take 237 mLs by mouth 2 (two) times daily between meals.   HYDROcodone-acetaminophen 5-325 MG tablet Commonly known as: NORCO/VICODIN Take 1 tablet by mouth every 4 (four) hours as needed for moderate pain.   levothyroxine 50 MCG tablet Commonly known as: SYNTHROID Take 50 mcg by mouth every morning.   losartan 50 MG tablet Commonly known as: COZAAR Take 50 mg by mouth 2 (two) times daily.   meclizine 12.5 MG tablet Commonly known as: ANTIVERT Take 1 tablet (12.5 mg total) by mouth 3 (three) times daily as needed for dizziness.   metoprolol succinate 25 MG 24 hr tablet Commonly known as: TOPROL-XL Take 25 mg by mouth daily.   multivitamin-iron-minerals-folic acid chewable tablet Chew 1 tablet by mouth daily.   ondansetron 4 MG tablet Commonly known as: ZOFRAN Take 1 tablet (4 mg total) by mouth every 6 (six) hours as needed for nausea.   senna-docusate 8.6-50 MG tablet Commonly known as: Senokot-S Take 1 tablet by mouth at bedtime as needed for moderate constipation.   sertraline 100 MG tablet Commonly known as: ZOLOFT Take 100 mg by mouth daily.       Contact information for follow-up providers    Anson [place], PA-C. Schedule an appointment as soon as possible for a visit in 2 week(s).    Specialty: Physician Assistant Why: For staple removal Contact information: [address] [place] [address] [phone]            Contact information for after-discharge care    Destination    [facility] .   Service: Skilled Nursing Contact information: [address] [place] [address] [phone]                 No Known Allergies  Consultations:  Orthopedics   Procedures/Studies: Dg Chest 1 View  Result Date: 11/19/2018 CLINICAL DATA:  Fall EXAM: CHEST  1 VIEW COMPARISON:  12/30/2016 FINDINGS: Lungs are clear.  No pleural effusion or pneumothorax. Cardiomegaly.  Thoracic aortic atherosclerosis. IMPRESSION: No evidence of acute cardiopulmonary disease. Thoracic aortic atherosclerosis. Electronically Signed   By: Charline Bills M.D.   On: 11/19/2018 18:13   Ct Head Wo Contrast  Result Date: 11/19/2018 CLINICAL DATA:  Fall with hip deformity EXAM: CT HEAD WITHOUT CONTRAST CT CERVICAL SPINE WITHOUT CONTRAST TECHNIQUE: Multidetector CT imaging of the head and cervical spine was performed following the standard protocol without intravenous contrast. Multiplanar CT image reconstructions of the cervical spine were also generated. COMPARISON:  CT brain 09/02/2017, 01/31/2017, 01/10/2017 FINDINGS: CT HEAD FINDINGS Brain: No acute territorial infarction, hemorrhage  or intracranial mass. Moderate periventricular, subcortical and deep white matter hypodensity consistent with chronic small vessel ischemic changes. Stable ventricle size. Vascular: No hyperdense vessels. Densely calcified cavernous carotid arteries. 11 mm soft tissue density with peripheral calcification at the right cavernous sinus. Skull: No fracture.  Mastoid sclerosis. Sinuses/Orbits: Mucosal thickening and cysts in the maxillary, sphenoid and ethmoid sinuses Other: None CT CERVICAL SPINE FINDINGS Alignment: Reversal of cervical  lordosis. Trace anterolisthesis C2 on C3, C3 on C4 and C4 on C5. Trace retrolisthesis C6 on C7. Facet alignment within normal limits. Skull base and vertebrae: No acute fracture. No primary bone lesion or focal pathologic process. Soft tissues and spinal canal: No prevertebral fluid or swelling. No visible canal hematoma. Disc levels: Mild degenerative changes at C3-C4 and C4-C5 with advanced degenerative change at C5-C6 and C6-C7. Multiple level facet degenerative change. Upper chest: Negative. Other: None IMPRESSION: 1. No CT evidence for acute intracranial abnormality. Atrophy and small vessel ischemic changes of the white matter. 2. Reversal of cervical lordosis with trace anterolisthesis C2 on C3, C3 on C4 and C4 on C5, probably degenerative. No fracture is seen. If ligamentous injury is a concern, MRI could be obtained. 3. 11 mm soft tissue density with peripheral calcification at the right cavernous sinus, possibly representing an aneurysm. This could be further evaluated with CT brain angiography. Electronically Signed   By: Jasmine Pang M.D.   On: 11/19/2018 17:39   Ct Cervical Spine Wo Contrast  Result Date: 11/19/2018 CLINICAL DATA:  Fall with hip deformity EXAM: CT HEAD WITHOUT CONTRAST CT CERVICAL SPINE WITHOUT CONTRAST TECHNIQUE: Multidetector CT imaging of the head and cervical spine was performed following the standard protocol without intravenous contrast. Multiplanar CT image reconstructions of the cervical spine were also generated. COMPARISON:  CT brain 09/02/2017, 01/31/2017, 01/10/2017 FINDINGS: CT HEAD FINDINGS Brain: No acute territorial infarction, hemorrhage or intracranial mass. Moderate periventricular, subcortical and deep white matter hypodensity consistent with chronic small vessel ischemic changes. Stable ventricle size. Vascular: No hyperdense vessels. Densely calcified cavernous carotid arteries. 11 mm soft tissue density with peripheral calcification at the right cavernous  sinus. Skull: No fracture.  Mastoid sclerosis. Sinuses/Orbits: Mucosal thickening and cysts in the maxillary, sphenoid and ethmoid sinuses Other: None CT CERVICAL SPINE FINDINGS Alignment: Reversal of cervical lordosis. Trace anterolisthesis C2 on C3, C3 on C4 and C4 on C5. Trace retrolisthesis C6 on C7. Facet alignment within normal limits. Skull base and vertebrae: No acute fracture. No primary bone lesion or focal pathologic process. Soft tissues and spinal canal: No prevertebral fluid or swelling. No visible canal hematoma. Disc levels: Mild degenerative changes at C3-C4 and C4-C5 with advanced degenerative change at C5-C6 and C6-C7. Multiple level facet degenerative change. Upper chest: Negative. Other: None IMPRESSION: 1. No CT evidence for acute intracranial abnormality. Atrophy and small vessel ischemic changes of the white matter. 2. Reversal of cervical lordosis with trace anterolisthesis C2 on C3, C3 on C4 and C4 on C5, probably degenerative. No fracture is seen. If ligamentous injury is a concern, MRI could be obtained. 3. 11 mm soft tissue density with peripheral calcification at the right cavernous sinus, possibly representing an aneurysm. This could be further evaluated with CT brain angiography. Electronically Signed   By: Jasmine Pang M.D.   On: 11/19/2018 17:39   Dg Hip Operative Unilat W Or W/o Pelvis Left  Result Date: 11/20/2018 CLINICAL DATA:  Patient status post ORIF proximal left femur. EXAM: OPERATIVE LEFT HIP (WITH PELVIS IF PERFORMED) 3 VIEWS  TECHNIQUE: Fluoroscopic spot image(s) were submitted for interpretation post-operatively. COMPARISON:  Hip radiograph 11/19/2018 FINDINGS: Patient status post I am nail fixation of proximal left femur fracture. Hardware appears intact. Fracture appears in improved anatomic alignment. IMPRESSION: Patient status post ORIF proximal left femur. Electronically Signed   By: Annia Belt M.D.   On: 11/20/2018 12:28   Dg Hip Unilat W Or Wo Pelvis 2-3  Views Left  Result Date: 11/19/2018 CLINICAL DATA:  Fall EXAM: DG HIP (WITH OR WITHOUT PELVIS) 2-3V LEFT COMPARISON:  None. FINDINGS: Intertrochanteric left hip fracture with foreshortening and varus angulation. Mild degenerative changes of the bilateral hips. Visualized bony pelvis appears intact. Degenerative changes of the lower lumbar spine. IMPRESSION: Intertrochanteric left hip fracture, as above. Electronically Signed   By: Charline Bills M.D.   On: 11/19/2018 18:14      Surgery on 11/20/18: INTRAMEDULLARY (IM) NAIL INTERTROCHANTRIC (Left)   Subjective: Patient seen and examined, sleeping comfortably but awoke easily.  Reports pain currently adequately controlled.  Denies fever/chills, chest pain, SOB or other acute complaints.  NO acute events reported overnight.   Discharge Exam: Vitals:   11/23/18 0017 11/23/18 0749  BP: 136/81 (!) 153/79  Pulse: 82 77  Resp: 17 16  Temp: 98.2 F (36.8 C) 98.4 F (36.9 C)  SpO2: 96% 95%   Vitals:   11/22/18 0800 11/22/18 1540 11/23/18 0017 11/23/18 0749  BP: 123/84 117/71 136/81 (!) 153/79  Pulse: 92 76 82 77  Resp: 15  17 16   Temp: 98.7 F (37.1 C) 97.8 F (36.6 C) 98.2 F (36.8 C) 98.4 F (36.9 C)  TempSrc: Oral Oral    SpO2: 95% 94% 96% 95%  Weight:      Height:        General: Pt is alert, awake, not in acute distress Cardiovascular: RRR, S1/S2 +, no rubs, no gallops Respiratory: CTA bilaterally, no wheezing, no rhonchi Abdominal: Soft, NT, ND, bowel sounds + Extremities: no edema, no cyanosis    The results of significant diagnostics from this hospitalization (including imaging, microbiology, ancillary and laboratory) are listed below for reference.     Microbiology: Recent Results (from the past 240 hour(s))  SARS CORONAVIRUS 2 (TAT 6-24 HRS) Nasopharyngeal Nasopharyngeal Swab     Status: None   Collection Time: 11/19/18  6:11 PM   Specimen: Nasopharyngeal Swab  Result Value Ref Range Status   SARS  Coronavirus 2 NEGATIVE NEGATIVE Final    Comment: (NOTE) SARS-CoV-2 target nucleic acids are NOT DETECTED. The SARS-CoV-2 RNA is generally detectable in upper and lower respiratory specimens during the acute phase of infection. Negative results do not preclude SARS-CoV-2 infection, do not rule out co-infections with other pathogens, and should not be used as the sole basis for treatment or other patient management decisions. Negative results must be combined with clinical observations, patient history, and epidemiological information. The expected result is Negative. Fact Sheet for Patients: 13/06/20 Fact Sheet for Healthcare Providers: HairSlick.no This test is not yet approved or cleared by the [place] FDA and  has been authorized for detection and/or diagnosis of SARS-CoV-2 by FDA under an Emergency Use Authorization (EUA). This EUA will remain  in effect (meaning this test can be used) for the duration of the COVID-19 declaration under Section 56 4(b)(1) of the Act, 21 U.S.C. section 360bbb-3(b)(1), unless the authorization is terminated or revoked sooner. Performed at [facility], [address], [place], [place]   SARS CORONAVIRUS 2 (TAT 6-24 HRS) Nasopharyngeal  Nasopharyngeal Swab     Status: None   Collection Time: 11/22/18  6:20 PM   Specimen: Nasopharyngeal Swab  Result Value Ref Range Status   SARS Coronavirus 2 NEGATIVE NEGATIVE Final    Comment: (NOTE) SARS-CoV-2 target nucleic acids are NOT DETECTED. The SARS-CoV-2 RNA is generally detectable in upper and lower respiratory specimens during the acute phase of infection. Negative results do not preclude SARS-CoV-2 infection, do not rule out co-infections with other pathogens, and should not be used as the sole basis for treatment or other patient management decisions. Negative results must be combined with clinical  observations, patient history, and epidemiological information. The expected result is Negative. Fact Sheet for Patients: HairSlick.no Fact Sheet for Healthcare Providers: quierodirigir.com This test is not yet approved or cleared by the [place] FDA and  has been authorized for detection and/or diagnosis of SARS-CoV-2 by FDA under an Emergency Use Authorization (EUA). This EUA will remain  in effect (meaning this test can be used) for the duration of the COVID-19 declaration under Section 56 4(b)(1) of the Act, 21 U.S.C. section 360bbb-3(b)(1), unless the authorization is terminated or revoked sooner. Performed at [facility], [address] [place], [place] [address]      Labs: BNP (last 3 results) No results for input(s): BNP in the last 8760 hours. Basic Metabolic Panel: Recent Labs  Lab 11/19/18 1707 11/21/18 0524 11/22/18 0416 11/23/18 0355  NA 136 134* 132* 132*  K 4.2 3.7 3.8 3.9  CL 101 97* 99 98  CO2 GLUCOSE 116* 126* 130* 153*  BUN 27*  CREATININE 0.90 1.04* 0.96 0.97  CALCIUM 8.9 8.1* 8.3* 8.6*   Liver Function Tests: No results for input(s): AST, ALT, ALKPHOS, BILITOT, PROT, ALBUMIN in the last 168 hours. No results for input(s): LIPASE, AMYLASE in the last 168 hours. No results for input(s): AMMONIA in the last 168 hours. CBC: Recent Labs  Lab 11/19/18 1707 11/21/18 0524 11/22/18 0416 11/23/18 0355  WBC 7.6 15.4* 11.2* 11.7*  NEUTROABS 5.2 12.8* 8.9*  --   HGB 12.1 10.4* 9.0* 9.7*  HCT 34.9* 29.9* 27.2* 28.0*  MCV 88.4 89.5 91.0 88.1  PLT 330 274 234 290   Cardiac Enzymes: No results for input(s): CKTOTAL, CKMB, CKMBINDEX, TROPONINI in the last 168 hours. BNP: Invalid input(s): POCBNP CBG: No results for input(s): GLUCAP in the last 168 hours. D-Dimer No results for input(s): DDIMER in the last 72 hours. Hgb A1c No results for input(s): HGBA1C in the  last 72 hours. Lipid Profile No results for input(s): CHOL, HDL, LDLCALC, TRIG, CHOLHDL, LDLDIRECT in the last 72 hours. Thyroid function studies No results for input(s): TSH, T4TOTAL, T3FREE, THYROIDAB in the last 72 hours.  Invalid input(s): FREET3 Anemia work up No results for input(s): VITAMINB12, FOLATE, FERRITIN, TIBC, IRON, RETICCTPCT in the last 72 hours. Urinalysis    Component Value Date/Time   COLORURINE YELLOW (A) 11/20/2018 0126   APPEARANCEUR HAZY (A) 11/20/2018 0126   LABSPEC 1.018 11/20/2018 0126   PHURINE 6.0 11/20/2018 0126   GLUCOSEU NEGATIVE 11/20/2018 0126   HGBUR NEGATIVE 11/20/2018 0126   BILIRUBINUR NEGATIVE 11/20/2018 0126   BILIRUBINUR neg 01/22/2017 1445   KETONESUR 5 (A) 11/20/2018 0126   PROTEINUR NEGATIVE 11/20/2018 0126   UROBILINOGEN 0.2 01/22/2017 1445   NITRITE NEGATIVE 11/20/2018 0126   LEUKOCYTESUR NEGATIVE 11/20/2018 0126   Sepsis Labs Invalid input(s): PROCALCITONIN,  WBC,  LACTICIDVEN Microbiology Recent Results (from the  past 240 hour(s))  SARS CORONAVIRUS 2 (TAT 6-24 HRS) Nasopharyngeal Nasopharyngeal Swab     Status: None   Collection Time: 11/19/18  6:11 PM   Specimen: Nasopharyngeal Swab  Result Value Ref Range Status   SARS Coronavirus 2 NEGATIVE NEGATIVE Final    Comment: (NOTE) SARS-CoV-2 target nucleic acids are NOT DETECTED. The SARS-CoV-2 RNA is generally detectable in upper and lower respiratory specimens during the acute phase of infection. Negative results do not preclude SARS-CoV-2 infection, do not rule out co-infections with other pathogens, and should not be used as the sole basis for treatment or other patient management decisions. Negative results must be combined with clinical observations, patient history, and epidemiological information. The expected result is Negative. Fact Sheet for Patients: SugarRoll.be Fact Sheet for Healthcare  Providers: https://www.woods-mathews.com/ This test is not yet approved or cleared by the [place] FDA and  has been authorized for detection and/or diagnosis of SARS-CoV-2 by FDA under an Emergency Use Authorization (EUA). This EUA will remain  in effect (meaning this test can be used) for the duration of the COVID-19 declaration under Section 56 4(b)(1) of the Act, 21 U.S.C. section 360bbb-3(b)(1), unless the authorization is terminated or revoked sooner. Performed at [facility], [place] [address] [place], [place] [address]   SARS CORONAVIRUS 2 (TAT 6-24 HRS) Nasopharyngeal Nasopharyngeal Swab     Status: None   Collection Time: 11/22/18  6:20 PM   Specimen: Nasopharyngeal Swab  Result Value Ref Range Status   SARS Coronavirus 2 NEGATIVE NEGATIVE Final    Comment: (NOTE) SARS-CoV-2 target nucleic acids are NOT DETECTED. The SARS-CoV-2 RNA is generally detectable in upper and lower respiratory specimens during the acute phase of infection. Negative results do not preclude SARS-CoV-2 infection, do not rule out co-infections with other pathogens, and should not be used as the sole basis for treatment or other patient management decisions. Negative results must be combined with clinical observations, patient history, and epidemiological information. The expected result is Negative. Fact Sheet for Patients: SugarRoll.be Fact Sheet for Healthcare Providers: https://www.woods-mathews.com/ This test is not yet approved or cleared by the [place] FDA and  has been authorized for detection and/or diagnosis of SARS-CoV-2 by FDA under an Emergency Use Authorization (EUA). This EUA will remain  in effect (meaning this test can be used) for the duration of the COVID-19 declaration under Section 56 4(b)(1) of the Act, 21 U.S.C. section 360bbb-3(b)(1), unless the authorization is terminated or revoked sooner. Performed at  [facility], [place] [address] [place], [place] [address]      Time coordinating discharge: Over 30 minutes  SIGNED:   Ezekiel Slocumb, DO [facility] 11/23/2018, 10:13 AM Pager [phone]  If 7PM-7AM, please contact night-coverage [email or web address] Password [email or web address]

## 2018-11-23 NOTE — TOC Transition Note (Signed)
Transition of Care [facility]) - CM/SW Discharge Note   Patient Details  Name: Deborah Martinez MRN: [id number] Date of Birth: [birth year]  [facility] CM/SW Contact:  Su Hilt, RN Phone Number: 11/23/2018, 2:23 PM   Clinical Narrative:    Received PASSR number [id number] A Notified Deborah Martinez at [place] number, Patient to DC to [facility] today  Final next level of care: Skilled Nursing Facility Barriers to Discharge: [facility] Deborah Martinez)   Patient Goals and CMS Choice Patient states their goals for this hospitalization and ongoing recovery are:: "I am not sure, I am 85 years old"   Choice offered to / list presented to : Adult Children  Discharge Placement                       Discharge Plan and Services In-house Referral: Clinical Social Work Discharge Planning Services: (P) CM Consult Post Acute Care Choice: Skilled Nursing Facility(patient's son would like long term care in a memory care unit following rehab)                               Social Determinants of Health (SDOH) Interventions     Readmission Risk Interventions No flowsheet data found.

## 2018-11-23 NOTE — Progress Notes (Signed)
Report called to " Tasha " at [facility] , patient will be going to [facility]. Discharge summary and hard rx included in d/c packet . Ems called.

## 2018-11-23 NOTE — TOC Progression Note (Signed)
Transition of Care [facility]) - Progression Note    Patient Details  Name: Deborah Martinez MRN: [id number] Date of Birth: 07-Jun-1927  Transition [facility]) CM/SW Contact  Su Hilt, RN Phone Number: 11/23/2018, 11:39 AM  Clinical Narrative:     Olivia Mackie With [place] was notified that Covid was done on 11/9 and the SS number was provided as requested, She is asking for PASSR number, I notified her that it is pending.  The PASSR is waived by the state at the moment I am asking [facility] to honor it,.  Awaiting responce  Expected Discharge Plan: Skilled Nursing Facility Barriers to Discharge: Unsafe home situation(per patient's son, her husband who was her primary caretaker has now been hospitalized following a fall)  Expected Discharge Plan and Services Expected Discharge Plan: [place] In-house Referral: Clinical Social Work Discharge Planning Services: (P) CM Consult Post Acute Care Choice: Skilled Nursing Facility(patient's son would like long term care in a memory care unit following rehab) Living arrangements for the past 2 months: ([facility]) Expected Discharge Date: 11/23/18                                     Social Determinants of Health (SDOH) Interventions    Readmission Risk Interventions No flowsheet data found.

## 2018-11-23 NOTE — TOC Progression Note (Signed)
Transition of Care [facility]) - Progression Note    Patient Details  Name: Deborah Martinez MRN: [id number] Date of Birth: 05/21/1927  Transition of Care [facility]) CM/SW [place], RN Phone Number: 11/23/2018, 12:33 PM  Clinical Narrative:    Isaias Cowman will not honor the PASSR waive by the government, I faxed the requested information to PASSR   Expected Discharge Plan: [place] Barriers to Discharge: [place] Rosalie Gums)  Expected Discharge Plan and Services Expected Discharge Plan: [place] In-house Referral: Clinical Social Work Discharge Planning Services: (P) CM Consult Post Acute Care Choice: Skilled Nursing Facility(patient's son would like long term care in a memory care unit following rehab) Living arrangements for the past 2 months: ([facility]) Expected Discharge Date: 11/23/18                                     Social Determinants of Health (SDOH) Interventions    Readmission Risk Interventions No flowsheet data found.

## 2018-11-23 NOTE — TOC Progression Note (Signed)
Transition of Care [facility]) - Progression Note    Patient Details  Name: Deborah Martinez MRN: [id number] Date of Birth: [birth year]  Transition of Care [facility]) CM/SW [place], RN Phone Number: 11/23/2018, 10:20 AM  Clinical Narrative:    Patient is to DC to [facility] today via EMS transport.  I called the Son Chrissie Noa and made him aware   Expected Discharge Plan: Skilled Nursing Facility Barriers to Discharge: Unsafe home situation(per patient's son, her husband who was her primary caretaker has now been hospitalized following a fall)  Expected Discharge Plan and Services Expected Discharge Plan: [place] In-house Referral: Clinical Social Work Discharge Planning Services: (P) CM Consult Post Acute Care Choice: Skilled Nursing Facility(patient's son would like long term care in a memory care unit following rehab) Living arrangements for the past 2 months: ([facility]) Expected Discharge Date: 11/23/18                                     Social Determinants of Health (SDOH) Interventions    Readmission Risk Interventions No flowsheet data found.

## 2018-12-01 ENCOUNTER — Emergency Department (HOSPITAL_COMMUNITY): Payer: Medicare Other

## 2018-12-01 ENCOUNTER — Encounter (HOSPITAL_COMMUNITY): Payer: Self-pay

## 2018-12-01 ENCOUNTER — Other Ambulatory Visit: Payer: Self-pay

## 2018-12-01 ENCOUNTER — Emergency Department (HOSPITAL_COMMUNITY)
Admission: EM | Admit: 2018-12-01 | Discharge: 2018-12-01 | Disposition: A | Discharge status: Home / Self Care | Payer: Medicare Other | Attending: Emergency Medicine | Admitting: Emergency Medicine

## 2018-12-01 DIAGNOSIS — S0003XA Contusion of scalp, initial encounter: Secondary | ICD-10-CM | POA: Insufficient documentation

## 2018-12-01 DIAGNOSIS — S0990XA Unspecified injury of head, initial encounter: Secondary | ICD-10-CM

## 2018-12-01 DIAGNOSIS — E039 Hypothyroidism, unspecified: Secondary | ICD-10-CM | POA: Diagnosis not present

## 2018-12-01 DIAGNOSIS — I10 Essential (primary) hypertension: Secondary | ICD-10-CM | POA: Insufficient documentation

## 2018-12-01 DIAGNOSIS — Z87891 Personal history of nicotine dependence: Secondary | ICD-10-CM | POA: Diagnosis not present

## 2018-12-01 DIAGNOSIS — Y92129 Unspecified place in nursing home as the place of occurrence of the external cause: Secondary | ICD-10-CM | POA: Insufficient documentation

## 2018-12-01 DIAGNOSIS — W19XXXA Unspecified fall, initial encounter: Secondary | ICD-10-CM | POA: Diagnosis not present

## 2018-12-01 DIAGNOSIS — I4891 Unspecified atrial fibrillation: Secondary | ICD-10-CM | POA: Insufficient documentation

## 2018-12-01 DIAGNOSIS — Y939 Activity, unspecified: Secondary | ICD-10-CM | POA: Diagnosis not present

## 2018-12-01 DIAGNOSIS — Z79899 Other long term (current) drug therapy: Secondary | ICD-10-CM | POA: Insufficient documentation

## 2018-12-01 DIAGNOSIS — Z7901 Long term (current) use of anticoagulants: Secondary | ICD-10-CM | POA: Diagnosis not present

## 2018-12-01 DIAGNOSIS — Y999 Unspecified external cause status: Secondary | ICD-10-CM | POA: Diagnosis not present

## 2018-12-01 NOTE — ED Triage Notes (Signed)
PT BIB [facility] from [facility]. Pt is currently doing rehab at [facility] for left hip surgery. Pt has been using a walker to stand and move around. Pt had an unwitnessed fall today, nursing home staff picked her up off the floor and called EMS. Pt has dementia at baseline and is currently at baseline.

## 2018-12-01 NOTE — ED Notes (Signed)
[facility] called to transport patient  

## 2018-12-01 NOTE — ED Notes (Signed)
Deborah Martinez son [phone]

## 2018-12-01 NOTE — ED Notes (Signed)
Report given to Colletta Maryland, Therapist, sports at [facility].

## 2018-12-01 NOTE — Discharge Instructions (Addendum)
You were seen in the emergency department for evaluation from injuries from a fall.  You had a CAT scan of your head and cervical spine that did not show any serious findings.  You have a large hematoma on the back of your head and you should use ice and Tylenol for pain.  Return to the emergency department if any concerning symptoms.

## 2018-12-01 NOTE — ED Notes (Signed)
Called son Yvone Neu and gave him update on pt and let him know pt was going back to nursing home.

## 2018-12-01 NOTE — ED Notes (Signed)
Pt has abrasion on posterior crown of head, w/a large hematoma.

## 2018-12-01 NOTE — ED Provider Notes (Signed)
[facility] Provider Note   CSN: [id number] Arrival date & time: 12/01/18  1601     History   Chief Complaint Chief Complaint  Patient presents with  . Fall    HPI Deborah Martinez is a 85 y.o. female.  She is sent in from her facility [facility].  She is there for rehab after having left hip surgery.  She was using a walker to stand and move around.  She had an unwitnessed fall and found by staff on the floor with a contusion and abrasion to the back of her head.  Patient herself does not recall the fall, level 5 caveat secondary to dementia.  She denies any complaints other than a sore spot on the back of her head.  She denies any recent illness.  No chest pain belly pain vomiting diarrhea or urinary symptoms.     The history is provided by the patient and the EMS personnel.  Fall This is a new problem. The current episode started 1 to 2 hours ago. The problem has not changed since onset.Associated symptoms include headaches. Pertinent negatives include no chest pain, no abdominal pain and no shortness of breath. Nothing aggravates the symptoms. Nothing relieves the symptoms. She has tried nothing for the symptoms. The treatment provided no relief.    Past Medical History:  Diagnosis Date  . Anxiety   . Constipation   . Cystocele   . Depression   . Hemorrhoid   . Hypercholesterolemia   . Hypertension   . Incomplete bladder emptying   . Postmenopausal atrophic vaginitis   . Rectocele   . Thyroid disease   . UTI (lower urinary tract infection)   . Vaginal enterocele     Patient Active Problem List   Diagnosis Date Noted  . Hypothyroid 11/20/2018  . Protein calorie malnutrition (HCC) 11/20/2018  . AF (paroxysmal atrial fibrillation) (HCC) 11/20/2018  . Femur fracture, left (HCC) 11/19/2018  . UTI (urinary tract infection) 02/01/2017  . Chest pain 01/10/2017  . Dizziness 01/10/2017  . Ataxia 01/10/2017  . Cystocele, midline  07/27/2014  . Menopause 07/27/2014  . Vaginal atrophy 07/27/2014  . Incomplete bladder emptying 07/27/2014  . Anxiety and depression 07/27/2014  . Constipation 07/27/2014  . Hypertension 07/27/2014  . Hypercholesterolemia 07/27/2014  . Hemorrhoids 07/27/2014  . Rectocele 07/27/2014  . Vaginal enterocele 07/27/2014    Past Surgical History:  Procedure Laterality Date  . APPENDECTOMY    . EYE SURGERY  2012   remve cataract  . INTRAMEDULLARY (IM) NAIL INTERTROCHANTERIC Left 11/20/2018   Procedure: INTRAMEDULLARY (IM) NAIL INTERTROCHANTRIC;  Surgeon: Christena FlakePoggi, John J, MD;  Location: [facility] ORS;  Service: Orthopedics;  Laterality: Left;  . TOTAL ABDOMINAL HYSTERECTOMY  1977     OB History   No obstetric history on file.      Home Medications    Prior to Admission medications   Medication Sig Start Date End Date Taking? Authorizing Provider  acetaminophen (TYLENOL) 325 MG tablet Take 2 tablets (650 mg total) by mouth every 6 (six) hours as needed for mild pain or fever. 11/23/18   Pennie BanterGriffith, Kelly A, DO  alendronate (FOSAMAX) 70 MG tablet Take 70 mg by mouth once a week. Take with a full glass of water on an empty stomach.    [provider]  buPROPion (WELLBUTRIN SR) 100 MG 12 hr tablet Take 100 mg by mouth daily. 10/14/18   [provider]  calcium carbonate (TUMS - DOSED IN  MG ELEMENTAL CALCIUM) 500 MG chewable tablet Chew 2 tablets (400 mg of elemental calcium total) by mouth 2 (two) times daily as needed for indigestion or heartburn. 11/23/18   Ezekiel Slocumb, DO  docusate sodium (COLACE) 100 MG capsule Take 1 capsule (100 mg total) by mouth 2 (two) times daily. 11/23/18   Nicole Kindred A, DO  donepezil (ARICEPT) 5 MG tablet Take 5 mg by mouth at bedtime. 11/18/18   [provider]  ELIQUIS 5 MG TABS tablet Take 5 mg by mouth 2 (two) times daily. 11/18/18   [provider]  feeding supplement, ENSURE ENLIVE, (ENSURE ENLIVE) LIQD Take 237 mLs by  mouth 2 (two) times daily between meals. 11/23/18   Ezekiel Slocumb, DO  HYDROcodone-acetaminophen (NORCO/VICODIN) 5-325 MG tablet Take 1 tablet by mouth every 4 (four) hours as needed for moderate pain. 11/23/18   Ezekiel Slocumb, DO  levothyroxine (SYNTHROID, LEVOTHROID) 50 MCG tablet Take 50 mcg by mouth every morning.     [provider]  losartan (COZAAR) 50 MG tablet Take 50 mg by mouth 2 (two) times daily. 11/11/18   [provider]  meclizine (ANTIVERT) 12.5 MG tablet Take 1 tablet (12.5 mg total) by mouth 3 (three) times daily as needed for dizziness. 02/03/17   Epifanio Lesches, MD  metoprolol succinate (TOPROL-XL) 25 MG 24 hr tablet Take 25 mg by mouth daily. 10/28/18   [provider]  multivitamin-iron-minerals-folic acid (CENTRUM) chewable tablet Chew 1 tablet by mouth daily.    [provider]  ondansetron (ZOFRAN) 4 MG tablet Take 1 tablet (4 mg total) by mouth every 6 (six) hours as needed for nausea. 11/23/18   Nicole Kindred A, DO  senna-docusate (SENOKOT-S) 8.6-50 MG tablet Take 1 tablet by mouth at bedtime as needed for moderate constipation. 11/23/18   Nicole Kindred A, DO  sertraline (ZOLOFT) 100 MG tablet Take 100 mg by mouth daily. 08/20/18   [provider]    Family History Family History  Problem Relation Age of Onset  . Heart attack Father   . Diabetes Brother   . Cancer Neg Hx     Social History Social History   Tobacco Use  . Smoking status: Former Research scientist (life sciences)  . Smokeless tobacco: Never Used  Substance Use Topics  . Alcohol use: No  . Drug use: No     Allergies   Patient has no known allergies.   Review of Systems Review of Systems  Constitutional: Negative for fever.  HENT: Negative for sore throat.   Eyes: Negative for visual disturbance.  Respiratory: Negative for shortness of breath.   Cardiovascular: Negative for chest pain.  Gastrointestinal: Negative for abdominal pain.  Genitourinary:  Negative for dysuria.  Musculoskeletal: Negative for neck pain.  Skin: Positive for wound. Negative for rash.  Neurological: Positive for headaches.     Physical Exam Updated Vital Signs BP (!) 161/110   Pulse 81   Temp 97.8 F (36.6 C)   Resp 20   SpO2 97%   Physical Exam Vitals signs and nursing note reviewed.  Constitutional:      General: She is not in acute distress.    Appearance: She is well-developed.  HENT:     Head: Normocephalic.     Comments: She is approximately 3 cm hematoma left parietal with some overlying abrasion.  No active bleeding. Eyes:     Conjunctiva/sclera: Conjunctivae normal.  Neck:     Musculoskeletal: No muscular tenderness.     Comments:  C-collar in place. Cardiovascular:     Rate and Rhythm: Normal rate and regular rhythm.     Heart sounds: No murmur.  Pulmonary:     Effort: Pulmonary effort is normal. No respiratory distress.     Breath sounds: Normal breath sounds. No stridor. No wheezing.  Abdominal:     Palpations: Abdomen is soft.     Tenderness: There is no abdominal tenderness.  Musculoskeletal: Normal range of motion.        General: No tenderness or deformity.  Skin:    General: Skin is warm and dry.     Capillary Refill: Capillary refill takes less than 2 seconds.     Findings: Bruising (Left thigh.  Likely resolving from her prior hip surgery.) present.  Neurological:     General: No focal deficit present.     Mental Status: She is alert. Mental status is at baseline.     GCS: GCS eye subscore is 4. GCS verbal subscore is 5. GCS motor subscore is 6.     Sensory: No sensory deficit.     Motor: No weakness.      ED Treatments / Results  Labs (all labs ordered are listed, but only abnormal results are displayed) Labs Reviewed - No data to display  EKG None  Radiology Ct Head Wo Contrast  Result Date: 12/01/2018 CLINICAL DATA:  Unwitnessed fall EXAM: CT HEAD WITHOUT CONTRAST CT CERVICAL SPINE WITHOUT CONTRAST  TECHNIQUE: Multidetector CT imaging of the head and cervical spine was performed following the standard protocol without intravenous contrast. Multiplanar CT image reconstructions of the cervical spine were also generated. COMPARISON:  11/19/2018 CT, 01/31/2017, 01/10/2017 FINDINGS: CT HEAD FINDINGS Brain: No acute territorial infarction, hemorrhage, or intracranial mass is visualized. Atrophy and moderate small vessel ischemic changes of the white matter. Stable ventricle size. Stable partially calcified soft tissue density at the right cavernous sinus. Vascular: No hyperdense vessels.  Carotid vascular calcification Skull: Normal. Negative for fracture or focal lesion. Sinuses/Orbits: No acute finding. Other: Large left posterior parietal scalp hematoma CT CERVICAL SPINE FINDINGS Alignment: Reversal of cervical lordosis. Stable trace anterolisthesis C3 on C4 and C4 on C5 with trace retrolisthesis C6 on C7. Facet alignment is maintained Skull base and vertebrae: No acute fracture. No primary bone lesion or focal pathologic process. Soft tissues and spinal canal: No prevertebral fluid or swelling. No visible canal hematoma. Disc levels: Multiple level degenerative change, most advanced at C5-C6 and C6-C7. Multiple level facet degenerative change and multiple level foraminal stenosis. Upper chest: Negative. Other: None IMPRESSION: 1. No CT evidence for acute intracranial abnormality. Atrophy and small vessel ischemic changes of the white matter. Large left posterior parietal scalp hematoma 2. Reversal of cervical lordosis with stable anterolisthesis C3 on C4, C4 on C5 and retrolisthesis C6 and C7. No acute fracture is seen. Multiple level degenerative change Electronically Signed   By: Jasmine Pang M.D.   On: 12/01/2018 19:30   Ct Cervical Spine Wo Contrast  Result Date: 12/01/2018 CLINICAL DATA:  Unwitnessed fall EXAM: CT HEAD WITHOUT CONTRAST CT CERVICAL SPINE WITHOUT CONTRAST TECHNIQUE: Multidetector CT  imaging of the head and cervical spine was performed following the standard protocol without intravenous contrast. Multiplanar CT image reconstructions of the cervical spine were also generated. COMPARISON:  11/19/2018 CT, 01/31/2017, 01/10/2017 FINDINGS: CT HEAD FINDINGS Brain: No acute territorial infarction, hemorrhage, or intracranial mass is visualized. Atrophy and moderate small vessel ischemic changes of the white matter. Stable ventricle size. Stable partially calcified soft tissue  density at the right cavernous sinus. Vascular: No hyperdense vessels.  Carotid vascular calcification Skull: Normal. Negative for fracture or focal lesion. Sinuses/Orbits: No acute finding. Other: Large left posterior parietal scalp hematoma CT CERVICAL SPINE FINDINGS Alignment: Reversal of cervical lordosis. Stable trace anterolisthesis C3 on C4 and C4 on C5 with trace retrolisthesis C6 on C7. Facet alignment is maintained Skull base and vertebrae: No acute fracture. No primary bone lesion or focal pathologic process. Soft tissues and spinal canal: No prevertebral fluid or swelling. No visible canal hematoma. Disc levels: Multiple level degenerative change, most advanced at C5-C6 and C6-C7. Multiple level facet degenerative change and multiple level foraminal stenosis. Upper chest: Negative. Other: None IMPRESSION: 1. No CT evidence for acute intracranial abnormality. Atrophy and small vessel ischemic changes of the white matter. Large left posterior parietal scalp hematoma 2. Reversal of cervical lordosis with stable anterolisthesis C3 on C4, C4 on C5 and retrolisthesis C6 and C7. No acute fracture is seen. Multiple level degenerative change Electronically Signed   By: Jasmine Pang M.D.   On: 12/01/2018 19:30    Procedures Procedures (including critical care time)  Medications Ordered in ED Medications - No data to display   Initial Impression / Assessment and Plan / ED Course  I have reviewed the triage vital signs  and the nursing notes.  Pertinent labs & imaging results that were available during my care of the patient were reviewed by me and considered in my medical decision making (see chart for details).  Clinical Course as of Dec 01 937  Wed Dec 01, 2018  1953 Not speaking.  85 year old female here after an unwitnessed fall.  She is on Eliquis.  Differential includes scalp hematoma, intercerebral bleed, cervical fracture.  She is otherwise well-appearing and neuro intact.  Baseline dementia.  Getting head CT and cervical spine CT.   [MB]  1953 Head CT interpreted by me as no intracranial bleed.  Radiology reading is same, white matter changes,   [MB]  1951  cervical spine with no acute fractures   [MB]    Clinical Course User Index [MB] Terrilee Files, MD        Final Clinical Impressions(s) / ED Diagnoses   Final diagnoses:  Contusion of scalp, initial encounter  Fall, initial encounter  Closed head injury, initial encounter    ED Discharge Orders    None       Terrilee Files, MD 12/02/18 [phone]

## 2019-03-23 ENCOUNTER — Encounter: Payer: Self-pay | Admitting: Emergency Medicine

## 2019-03-23 ENCOUNTER — Inpatient Hospital Stay
Admit: 2019-03-23 | Discharge: 2019-03-23 | Disposition: A | Discharge status: Home / Self Care | Payer: Medicare Other | Attending: Internal Medicine | Admitting: Internal Medicine

## 2019-03-23 ENCOUNTER — Other Ambulatory Visit: Payer: Self-pay

## 2019-03-23 ENCOUNTER — Inpatient Hospital Stay
Admission: EM | Admit: 2019-03-23 | Discharge: 2019-03-28 | DRG: 291 | Disposition: A | Discharge status: Home Health | Payer: Medicare Other | Source: Skilled Nursing Facility | Attending: Internal Medicine | Admitting: Internal Medicine

## 2019-03-23 ENCOUNTER — Emergency Department: Payer: Medicare Other

## 2019-03-23 DIAGNOSIS — E039 Hypothyroidism, unspecified: Secondary | ICD-10-CM | POA: Diagnosis present

## 2019-03-23 DIAGNOSIS — I509 Heart failure, unspecified: Secondary | ICD-10-CM | POA: Diagnosis not present

## 2019-03-23 DIAGNOSIS — F329 Major depressive disorder, single episode, unspecified: Secondary | ICD-10-CM | POA: Diagnosis present

## 2019-03-23 DIAGNOSIS — F419 Anxiety disorder, unspecified: Secondary | ICD-10-CM | POA: Diagnosis present

## 2019-03-23 DIAGNOSIS — Z87891 Personal history of nicotine dependence: Secondary | ICD-10-CM | POA: Diagnosis not present

## 2019-03-23 DIAGNOSIS — Z8249 Family history of ischemic heart disease and other diseases of the circulatory system: Secondary | ICD-10-CM | POA: Diagnosis not present

## 2019-03-23 DIAGNOSIS — Z7901 Long term (current) use of anticoagulants: Secondary | ICD-10-CM | POA: Diagnosis not present

## 2019-03-23 DIAGNOSIS — J9601 Acute respiratory failure with hypoxia: Secondary | ICD-10-CM | POA: Diagnosis present

## 2019-03-23 DIAGNOSIS — E785 Hyperlipidemia, unspecified: Secondary | ICD-10-CM | POA: Diagnosis present

## 2019-03-23 DIAGNOSIS — I482 Chronic atrial fibrillation, unspecified: Secondary | ICD-10-CM | POA: Diagnosis present

## 2019-03-23 DIAGNOSIS — Z7983 Long term (current) use of bisphosphonates: Secondary | ICD-10-CM | POA: Diagnosis not present

## 2019-03-23 DIAGNOSIS — I10 Essential (primary) hypertension: Secondary | ICD-10-CM

## 2019-03-23 DIAGNOSIS — Z79890 Hormone replacement therapy (postmenopausal): Secondary | ICD-10-CM

## 2019-03-23 DIAGNOSIS — I472 Ventricular tachycardia: Secondary | ICD-10-CM | POA: Diagnosis not present

## 2019-03-23 DIAGNOSIS — J189 Pneumonia, unspecified organism: Secondary | ICD-10-CM | POA: Diagnosis present

## 2019-03-23 DIAGNOSIS — Z833 Family history of diabetes mellitus: Secondary | ICD-10-CM

## 2019-03-23 DIAGNOSIS — Z20822 Contact with and (suspected) exposure to covid-19: Secondary | ICD-10-CM | POA: Diagnosis present

## 2019-03-23 DIAGNOSIS — F0390 Unspecified dementia without behavioral disturbance: Secondary | ICD-10-CM | POA: Diagnosis present

## 2019-03-23 DIAGNOSIS — K5900 Constipation, unspecified: Secondary | ICD-10-CM | POA: Diagnosis present

## 2019-03-23 DIAGNOSIS — Z90710 Acquired absence of both cervix and uterus: Secondary | ICD-10-CM

## 2019-03-23 DIAGNOSIS — R0902 Hypoxemia: Secondary | ICD-10-CM

## 2019-03-23 DIAGNOSIS — I5043 Acute on chronic combined systolic (congestive) and diastolic (congestive) heart failure: Secondary | ICD-10-CM | POA: Diagnosis present

## 2019-03-23 DIAGNOSIS — E7800 Pure hypercholesterolemia, unspecified: Secondary | ICD-10-CM | POA: Diagnosis present

## 2019-03-23 DIAGNOSIS — Z79899 Other long term (current) drug therapy: Secondary | ICD-10-CM

## 2019-03-23 DIAGNOSIS — I110 Hypertensive heart disease with heart failure: Secondary | ICD-10-CM | POA: Diagnosis present

## 2019-03-23 DIAGNOSIS — I480 Paroxysmal atrial fibrillation: Secondary | ICD-10-CM | POA: Diagnosis not present

## 2019-03-23 DIAGNOSIS — R0600 Dyspnea, unspecified: Secondary | ICD-10-CM

## 2019-03-23 LAB — CBC
HCT: 32.2 % — ABNORMAL LOW (ref 36.0–46.0)
Hemoglobin: 10.2 g/dL — ABNORMAL LOW (ref 12.0–15.0)
MCH: 29.5 pg (ref 26.0–34.0)
MCHC: 31.7 g/dL (ref 30.0–36.0)
MCV: 93.1 fL (ref 80.0–100.0)
Platelets: 299 10*3/uL (ref 150–400)
RBC: 3.46 MIL/uL — ABNORMAL LOW (ref 3.87–5.11)
RDW: 14.5 % (ref 11.5–15.5)
WBC: 6.1 10*3/uL (ref 4.0–10.5)
nRBC: 0 % (ref 0.0–0.2)

## 2019-03-23 LAB — COMPREHENSIVE METABOLIC PANEL
ALT: 17 U/L (ref 0–44)
AST: 30 U/L (ref 15–41)
Albumin: 3.1 g/dL — ABNORMAL LOW (ref 3.5–5.0)
Alkaline Phosphatase: 74 U/L (ref 38–126)
Anion gap: 11 (ref 5–15)
BUN: 19 mg/dL (ref 8–23)
CO2: 25 mmol/L (ref 22–32)
Calcium: 8.6 mg/dL — ABNORMAL LOW (ref 8.9–10.3)
Chloride: 97 mmol/L — ABNORMAL LOW (ref 98–111)
Creatinine, Ser: 0.85 mg/dL (ref 0.44–1.00)
GFR calc Af Amer: >60 mL/min (ref >60)
GFR calc non Af Amer: >60 mL/min (ref >60)
Glucose, Bld: 141 mg/dL — ABNORMAL HIGH (ref 70–99)
Potassium: 3.6 mmol/L (ref 3.5–5.1)
Sodium: 133 mmol/L — ABNORMAL LOW (ref 135–145)
Total Bilirubin: 1.1 mg/dL (ref 0.3–1.2)
Total Protein: 5.9 g/dL — ABNORMAL LOW (ref 6.5–8.1)

## 2019-03-23 LAB — BRAIN NATRIURETIC PEPTIDE: B Natriuretic Peptide: 1520 pg/mL — ABNORMAL HIGH (ref 0.0–100.0)

## 2019-03-23 LAB — TROPONIN I (HIGH SENSITIVITY)
Troponin I (High Sensitivity): 28 ng/L — ABNORMAL HIGH (ref <18)
Troponin I (High Sensitivity): 33 ng/L — ABNORMAL HIGH (ref <18)

## 2019-03-23 LAB — RESPIRATORY PANEL BY RT PCR (FLU A&B, COVID)
Influenza A by PCR: NEGATIVE
Influenza B by PCR: NEGATIVE
SARS Coronavirus 2 by RT PCR: NEGATIVE

## 2019-03-23 LAB — MRSA PCR SCREENING: MRSA by PCR: NEGATIVE

## 2019-03-23 LAB — PROCALCITONIN: Procalcitonin: <0.1 ng/mL

## 2019-03-23 MED ORDER — METOPROLOL SUCCINATE ER 25 MG PO TB24
25.0000 mg | ORAL_TABLET | Freq: Every day | ORAL | Status: DC
  Administered 2019-03-24: 25 mg via ORAL
  Filled 2019-03-23: qty 1

## 2019-03-23 MED ORDER — LOSARTAN POTASSIUM 50 MG PO TABS
50.0000 mg | ORAL_TABLET | Freq: Two times a day (BID) | ORAL | Status: DC
  Administered 2019-03-23 – 2019-03-28 (×10): 50 mg via ORAL
  Filled 2019-03-23 (×10): qty 1

## 2019-03-23 MED ORDER — SODIUM CHLORIDE 0.9% FLUSH
3.0000 mL | Freq: Two times a day (BID) | INTRAVENOUS | Status: DC
  Administered 2019-03-23 – 2019-03-28 (×9): 3 mL via INTRAVENOUS

## 2019-03-23 MED ORDER — LEVOTHYROXINE SODIUM 50 MCG PO TABS
50.0000 ug | ORAL_TABLET | ORAL | Status: DC
  Administered 2019-03-24 – 2019-03-28 (×4): 50 ug via ORAL
  Filled 2019-03-23 (×5): qty 1

## 2019-03-23 MED ORDER — CALCIUM CARBONATE ANTACID 500 MG PO CHEW
2.0000 | CHEWABLE_TABLET | Freq: Two times a day (BID) | ORAL | Status: DC | PRN
  Administered 2019-03-27: 400 mg via ORAL
  Filled 2019-03-23: qty 2

## 2019-03-23 MED ORDER — HYDROCODONE-ACETAMINOPHEN 5-325 MG PO TABS
1.0000 | ORAL_TABLET | ORAL | Status: DC | PRN
  Administered 2019-03-26 – 2019-03-27 (×3): 1 via ORAL
  Filled 2019-03-23 (×3): qty 1

## 2019-03-23 MED ORDER — DOCUSATE SODIUM 100 MG PO CAPS
100.0000 mg | ORAL_CAPSULE | Freq: Two times a day (BID) | ORAL | Status: DC
  Administered 2019-03-23 – 2019-03-28 (×10): 100 mg via ORAL
  Filled 2019-03-23 (×10): qty 1

## 2019-03-23 MED ORDER — LISINOPRIL 5 MG PO TABS: 2.5000 mg | ORAL_TABLET | Freq: Every day | ORAL | Status: DC

## 2019-03-23 MED ORDER — SODIUM CHLORIDE 0.9% FLUSH: 3.0000 mL | INTRAVENOUS | Status: DC | PRN

## 2019-03-23 MED ORDER — FERROUS SULFATE 325 (65 FE) MG PO TABS
325.0000 mg | ORAL_TABLET | Freq: Every day | ORAL | Status: DC
  Administered 2019-03-24 – 2019-03-28 (×5): 325 mg via ORAL
  Filled 2019-03-23 (×5): qty 1

## 2019-03-23 MED ORDER — SENNOSIDES-DOCUSATE SODIUM 8.6-50 MG PO TABS: 1.0000 | ORAL_TABLET | Freq: Every evening | ORAL | Status: DC | PRN

## 2019-03-23 MED ORDER — DONEPEZIL HCL 5 MG PO TABS
5.0000 mg | ORAL_TABLET | Freq: Every day | ORAL | Status: DC
  Administered 2019-03-23 – 2019-03-27 (×5): 5 mg via ORAL
  Filled 2019-03-23 (×6): qty 1

## 2019-03-23 MED ORDER — BUPROPION HCL ER (SR) 100 MG PO TB12
100.0000 mg | ORAL_TABLET | Freq: Every day | ORAL | Status: DC
  Administered 2019-03-24 – 2019-03-28 (×5): 100 mg via ORAL
  Filled 2019-03-23 (×5): qty 1

## 2019-03-23 MED ORDER — ACETAMINOPHEN 325 MG PO TABS
650.0000 mg | ORAL_TABLET | Freq: Four times a day (QID) | ORAL | Status: DC | PRN
  Administered 2019-03-24 – 2019-03-27 (×2): 650 mg via ORAL
  Filled 2019-03-23 (×2): qty 2

## 2019-03-23 MED ORDER — MECLIZINE HCL 12.5 MG PO TABS
12.5000 mg | ORAL_TABLET | Freq: Three times a day (TID) | ORAL | Status: DC | PRN
  Administered 2019-03-23: 12.5 mg via ORAL
  Filled 2019-03-23 (×2): qty 1

## 2019-03-23 MED ORDER — ENSURE ENLIVE PO LIQD
237.0000 mL | Freq: Two times a day (BID) | ORAL | Status: DC
  Administered 2019-03-23 – 2019-03-28 (×9): 237 mL via ORAL

## 2019-03-23 MED ORDER — ACETAMINOPHEN 325 MG PO TABS: 650.0000 mg | ORAL_TABLET | ORAL | Status: DC | PRN

## 2019-03-23 MED ORDER — ONDANSETRON HCL 4 MG PO TABS: 4.0000 mg | ORAL_TABLET | Freq: Four times a day (QID) | ORAL | Status: DC | PRN

## 2019-03-23 MED ORDER — MAGNESIUM SULFATE 2 GM/50ML IV SOLN
2.0000 g | Freq: Once | INTRAVENOUS | Status: AC
  Administered 2019-03-23: 2 g via INTRAVENOUS
  Filled 2019-03-23: qty 50

## 2019-03-23 MED ORDER — SODIUM CHLORIDE 0.9 % IV SOLN
250.0000 mL | INTRAVENOUS | Status: DC | PRN
  Administered 2019-03-23: 250 mL via INTRAVENOUS

## 2019-03-23 MED ORDER — SODIUM CHLORIDE 0.9 % IV SOLN
1.0000 g | Freq: Once | INTRAVENOUS | Status: DC
  Administered 2019-03-23: 1 g via INTRAVENOUS
  Filled 2019-03-23: qty 10

## 2019-03-23 MED ORDER — ONDANSETRON HCL 4 MG/2ML IJ SOLN
4.0000 mg | Freq: Four times a day (QID) | INTRAMUSCULAR | Status: DC | PRN
  Administered 2019-03-26: 4 mg via INTRAVENOUS

## 2019-03-23 MED ORDER — ORAL CARE MOUTH RINSE
15.0000 mL | Freq: Two times a day (BID) | OROMUCOSAL | Status: DC
  Administered 2019-03-23 – 2019-03-28 (×7): 15 mL via OROMUCOSAL

## 2019-03-23 MED ORDER — FUROSEMIDE 10 MG/ML IJ SOLN
40.0000 mg | Freq: Once | INTRAMUSCULAR | Status: AC
  Administered 2019-03-23: 40 mg via INTRAVENOUS
  Filled 2019-03-23: qty 4

## 2019-03-23 MED ORDER — FUROSEMIDE 10 MG/ML IJ SOLN
40.0000 mg | Freq: Two times a day (BID) | INTRAMUSCULAR | Status: DC
  Administered 2019-03-24 – 2019-03-25 (×3): 40 mg via INTRAVENOUS
  Filled 2019-03-23 (×3): qty 4

## 2019-03-23 MED ORDER — APIXABAN 5 MG PO TABS
5.0000 mg | ORAL_TABLET | Freq: Two times a day (BID) | ORAL | Status: DC
  Administered 2019-03-23 – 2019-03-28 (×10): 5 mg via ORAL
  Filled 2019-03-23 (×10): qty 1

## 2019-03-23 MED ORDER — METOPROLOL SUCCINATE ER 50 MG PO TB24: 25.0000 mg | ORAL_TABLET | Freq: Every day | ORAL | Status: DC

## 2019-03-23 MED ORDER — SODIUM CHLORIDE 0.9 % IV SOLN
500.0000 mg | Freq: Once | INTRAVENOUS | Status: DC
  Filled 2019-03-23: qty 500

## 2019-03-23 MED ORDER — SERTRALINE HCL 50 MG PO TABS
100.0000 mg | ORAL_TABLET | Freq: Every day | ORAL | Status: DC
  Administered 2019-03-23 – 2019-03-28 (×6): 100 mg via ORAL
  Filled 2019-03-23 (×6): qty 2

## 2019-03-23 NOTE — ED Notes (Signed)
Report given to [facility].

## 2019-03-23 NOTE — H&P (Signed)
History and Physical    Deborah Martinez:[id number] DOB: [birth year] DOA: 03/23/2019  PCP: [name], MD  Patient coming from: Skilled nursing facility  I have personally briefly reviewed patient's old medical records in [facility]  Chief Complaint: Shortness of breath, fatigue  HPI: Deborah Martinez is a 86 y.o. female with medical history significant of atrial fibrillation, chronic anticoagulation on Eliquis, anxiety, hypertension, hyperlipidemia who is brought to the emergency department from her resident skilled nursing facility for shortness of breath.  Patient has been experiencing approximately 3 to 4 days of worsening shortness of breath.  She is on room air at baseline.  Patient has chronic peripheral edema and currently has lower extremity wraps in place however endorses that her lower extremities have become more edematous recently.  No chest pain.  On my arrival patient only endorses fatigue and only admits to shortness of breath on prompting.  She does not appear in extremis.  No use of accessory muscles.  Normal work of breathing.  Covid was taken and is negative.  ED Course: On room air patient was found to be saturating 86-87.  She was placed on 2 L supplemental oxygen with appropriate rise in her oxygen saturation.  Patient sees Dr. Gwen Pounds as outpatient for atrial fibrillation management.  She is only on hydrochlorothiazide.  Chest x-ray done in emergency department read as possible multifocal pneumonia.  Patient is afebrile with no other clinical indicators of infection.  Procalcitonin taken in the emergency department also negative.  BNP is elevated at 1500.  Patient was given 40 mg of IV Lasix.  Hospitalist called for admission.  Review of Systems: As per HPI otherwise 10 point review of systems negative.    Past Medical History:  Diagnosis Date  . Anxiety   . Constipation   . Cystocele   . Depression   . Hemorrhoid   . Hypercholesterolemia   . Hypertension   .  Incomplete bladder emptying   . Postmenopausal atrophic vaginitis   . Rectocele   . Thyroid disease   . UTI (lower urinary tract infection)   . Vaginal enterocele     Past Surgical History:  Procedure Laterality Date  . APPENDECTOMY    . EYE SURGERY  2012   remve cataract  . INTRAMEDULLARY (IM) NAIL INTERTROCHANTERIC Left 11/20/2018   Procedure: INTRAMEDULLARY (IM) NAIL INTERTROCHANTRIC;  Surgeon: Christena Flake, MD;  Location: [facility] ORS;  Service: Orthopedics;  Laterality: Left;  . TOTAL ABDOMINAL HYSTERECTOMY  1977     reports that she has quit smoking. She has never used smokeless tobacco. She reports that she does not drink alcohol or use drugs.  No Known Allergies  Family History  Problem Relation Age of Onset  . Heart attack Father   . Diabetes Brother   . Cancer Neg Hx      Prior to Admission medications   Medication Sig Start Date End Date Taking? Authorizing Provider  acetaminophen (TYLENOL) 325 MG tablet Take 2 tablets (650 mg total) by mouth every 6 (six) hours as needed for mild pain or fever. 11/23/18  Yes Esaw Grandchild A, DO  alendronate (FOSAMAX) 70 MG tablet Take 70 mg by mouth once a week. Take with a full glass of water on an empty stomach.   Yes [provider]  Amino Acids-Protein Hydrolys (FEEDING SUPPLEMENT, PRO-STAT SUGAR FREE 64,) LIQD Take 30 mLs by mouth daily.   Yes [provider]  buPROPion (WELLBUTRIN SR) 100 MG 12 hr tablet  Take 100 mg by mouth daily. 10/14/18  Yes [provider]  calcium carbonate (TUMS - DOSED IN MG ELEMENTAL CALCIUM) 500 MG chewable tablet Chew 2 tablets (400 mg of elemental calcium total) by mouth 2 (two) times daily as needed for indigestion or heartburn. Patient taking differently: Chew 2 tablets by mouth in the morning and at bedtime.  11/23/18  Yes Esaw Grandchild A, DO  docusate sodium (COLACE) 100 MG capsule Take 1 capsule (100 mg total) by mouth 2 (two) times daily. 11/23/18  Yes Esaw Grandchild  A, DO  donepezil (ARICEPT) 5 MG tablet Take 5 mg by mouth at bedtime. 11/18/18  Yes [provider]  ELIQUIS 5 MG TABS tablet Take 5 mg by mouth 2 (two) times daily. 11/18/18  Yes [provider]  feeding supplement, ENSURE ENLIVE, (ENSURE ENLIVE) LIQD Take 237 mLs by mouth 2 (two) times daily between meals. 11/23/18  Yes Esaw Grandchild A, DO  ferrous sulfate 325 (65 FE) MG tablet Take 325 mg by mouth daily with breakfast.   Yes [provider]  hydrochlorothiazide (MICROZIDE) 12.5 MG capsule Take 12.5 mg by mouth daily.   Yes [provider]  HYDROcodone-acetaminophen (NORCO/VICODIN) 5-325 MG tablet Take 1 tablet by mouth every 4 (four) hours as needed for moderate pain. 11/23/18  Yes Esaw Grandchild A, DO  levothyroxine (SYNTHROID, LEVOTHROID) 50 MCG tablet Take 50 mcg by mouth every morning.    Yes [provider]  losartan (COZAAR) 50 MG tablet Take 50 mg by mouth 2 (two) times daily. 11/11/18  Yes [provider]  meclizine (ANTIVERT) 12.5 MG tablet Take 1 tablet (12.5 mg total) by mouth 3 (three) times daily as needed for dizziness. 02/03/17  Yes Katha Hamming, MD  metoprolol succinate (TOPROL-XL) 25 MG 24 hr tablet Take 25 mg by mouth daily. 10/28/18  Yes [provider]  multivitamin-iron-minerals-folic acid (CENTRUM) chewable tablet Chew 1 tablet by mouth daily.   Yes [provider]  ondansetron (ZOFRAN) 4 MG tablet Take 1 tablet (4 mg total) by mouth every 6 (six) hours as needed for nausea. 11/23/18  Yes Esaw Grandchild A, DO  phenylephrine-shark liver oil-mineral oil-petrolatum (PREPARATION H) 0.25-3-14-71.9 % rectal ointment Place 1 application rectally 4 (four) times daily as needed for hemorrhoids.   Yes [provider]  senna-docusate (SENOKOT-S) 8.6-50 MG tablet Take 1 tablet by mouth at bedtime as needed for moderate constipation. 11/23/18  Yes Esaw Grandchild A, DO  sertraline (ZOLOFT) 100 MG  tablet Take 100 mg by mouth daily. 08/20/18  Yes [provider]    Physical Exam: Vitals:   03/23/19 1230 03/23/19 1330 03/23/19 1409 03/23/19 1500  BP: (!) 155/87 (!) 160/105 (!) 157/100 (!) 159/95  Pulse: 74 80 80 79  Resp: 17 18 19 15   Temp:      TempSrc:      SpO2: 96% 94% 100% 99%  Weight:         Vitals:   03/23/19 1230 03/23/19 1330 03/23/19 1409 03/23/19 1500  BP: (!) 155/87 (!) 160/105 (!) 157/100 (!) 159/95  Pulse: 74 80 80 79  Resp: 17 18 19 15   Temp:      TempSrc:      SpO2: 96% 94% 100% 99%  Weight:      Constitutional: NAD, calm, comfortable, appears stated age Eyes: PERRL, lids and conjunctivae normal ENMT: Mucous membranes are moist. Posterior pharynx clear of any exudate or lesions.Normal dentition.  Neck: normal, supple, no masses, no thyromegaly Respiratory:  Lung sounds diminished bilaterally.  Scattered crackles bilaterally.  Decreased air movement.  Normal work of breathing. Cardiovascular: +JVD.  Regular rate and rhythm.  No murmurs.  3+ pitting edema bilaterally Abdomen: no tenderness, no masses palpated. No hepatosplenomegaly. Bowel sounds positive.  Musculoskeletal: no clubbing / cyanosis. No joint deformity upper and lower extremities. Good ROM, no contractures. Normal muscle tone.  Skin: no rashes, lesions, ulcers. No induration Neurologic: CN 2-12 grossly intact. Sensation intact, DTR normal. Strength 5/5 in all 4.  Psychiatric: Normal judgment and insight. Alert and oriented x 3. Normal mood.   Labs on Admission: I have personally reviewed following labs and imaging studies  CBC: Recent Labs  Lab 03/23/19 1145  WBC 6.1  HGB 10.2*  HCT 32.2*  MCV 93.1  PLT 272   Basic Metabolic Panel: Recent Labs  Lab 03/23/19 1145  NA 133*  K 3.6  CL 97*  CO2 25  GLUCOSE 141*  BUN 19  CREATININE 0.85  CALCIUM 8.6*   GFR: Estimated Creatinine Clearance: 46.7 mL/min (by C-G formula based on SCr of 0.85 mg/dL). Liver Function  Tests: Recent Labs  Lab 03/23/19 1145  AST 30  ALT 17  ALKPHOS 74  BILITOT 1.1  PROT 5.9*  ALBUMIN 3.1*   No results for input(s): LIPASE, AMYLASE in the last 168 hours. No results for input(s): AMMONIA in the last 168 hours. Coagulation Profile: No results for input(s): INR, PROTIME in the last 168 hours. Cardiac Enzymes: No results for input(s): CKTOTAL, CKMB, CKMBINDEX, TROPONINI in the last 168 hours. BNP (last 3 results) No results for input(s): PROBNP in the last 8760 hours. HbA1C: No results for input(s): HGBA1C in the last 72 hours. CBG: No results for input(s): GLUCAP in the last 168 hours. Lipid Profile: No results for input(s): CHOL, HDL, LDLCALC, TRIG, CHOLHDL, LDLDIRECT in the last 72 hours. Thyroid Function Tests: No results for input(s): TSH, T4TOTAL, FREET4, T3FREE, THYROIDAB in the last 72 hours. Anemia Panel: No results for input(s): VITAMINB12, FOLATE, FERRITIN, TIBC, IRON, RETICCTPCT in the last 72 hours. Urine analysis:    Component Value Date/Time   COLORURINE YELLOW (A) 11/20/2018 0126   APPEARANCEUR HAZY (A) 11/20/2018 0126   LABSPEC 1.018 11/20/2018 0126   PHURINE 6.0 11/20/2018 0126   GLUCOSEU NEGATIVE 11/20/2018 0126   HGBUR NEGATIVE 11/20/2018 0126   BILIRUBINUR NEGATIVE 11/20/2018 0126   BILIRUBINUR neg 01/22/2017 1445   KETONESUR 5 (A) 11/20/2018 0126   PROTEINUR NEGATIVE 11/20/2018 0126   UROBILINOGEN 0.2 01/22/2017 1445   NITRITE NEGATIVE 11/20/2018 0126   LEUKOCYTESUR NEGATIVE 11/20/2018 0126    Radiological Exams on Admission: DG Chest Portable 1 View  Result Date: 03/23/2019 CLINICAL DATA:  Short of breath. EXAM: PORTABLE CHEST 1 VIEW COMPARISON:  11/19/2018 FINDINGS: Normal heart size. Aortic atherosclerosis. Bilateral pleural effusions identified left greater than right. The mild interstitial edema. Airspace disease within the right upper lobe and left lower lobe noted. IMPRESSION: Right upper lobe and left lower lobe airspace  densities concerning for multifocal infection. Followup PA and lateral chest X-ray is recommended in 3-4 weeks following trial of antibiotic therapy to ensure resolution and exclude underlying malignancy. Small pleural effusions and mild interstitial edema suggesting CHF. Aortic Atherosclerosis (ICD10-I70.0). Electronically Signed   By: Kerby Moors M.D.   On: 03/23/2019 12:03    EKG: Independently reviewed.  Poor baseline.  Normal sinus rhythm, incomplete left bundle branch block  Assessment/Plan Active Problems:   Acute decompensated heart failure (HCC)   Fluid overload Suspected  acute decompensated heart failure Patient with no known history of heart failure Does have a history of atrial fibrillation Last echocardiogram in 2018, EF 55% with grade 1 diastolic dysfunction Patient received 40 mg IV Lasix in ED Plan: Admit to telemetry Lasix 40 mg IV twice daily Target net negative fluid balance 1-1.5L/day 2D echocardiogram Home Cozaar Continue home metoprolol Cardiology consulted, message sent to Dr. Darrold Junker Monitor and replace electrolytes, K> 4; mag>2  Atrial fibrillation Chronic anticoagulation on Eliquis Patient is rate controlled at this time Chest pain-free Plan: Continue metoprolol succinate 25 mg daily per home dose Continue Eliquis  Essential hypertension Continue home Cozaar 50 mg twice daily Continue metoprolol succinate 25 mg daily Lasix as above for fluid overload  Anxiety Dementia/cognitive decline Continue home bupropion Continue home Aricept Continue home sertraline  Hypothyroidism Continue home Synthroid  Constipation As needed bowel regimen  DVT prophylaxis: Eliquis Code Status: Full.  I discussed this with patient.  I explained the significance behind CODE STATUS.  She did not provide me with a very clear answer.  Assume full code for now.  We will revisit this with patient Family Communication: Offered to contact patient's family.  Patient  declined.  Plan of care was discussed with patient who understood. Disposition Plan: Anticipate discharge within 48 hours pending resolution of hypovolemic state and further investigation of suspect underlying heart failure Consults called: Cardiology- [place] Admission status: Telemetry)   Tresa Moore MD [facility] Pager [phone]  If 7PM-7AM, please contact night-coverage   03/23/2019, 3:33 PM

## 2019-03-23 NOTE — ED Provider Notes (Addendum)
[facility] Emergency Department Provider Note  Time seen: 11:46 AM  I have reviewed the triage vital signs and the nursing notes.   HISTORY  Chief Complaint Shortness of Breath   HPI Deborah Martinez is a 86 y.o. female with a past medical history of anxiety, constipation, hypertension, hyperlipidemia, presents to the emergency department for shortness of breath.  According to the patient since this morning she has been feeling short of breath.  Does not wear oxygen at baseline.  Patient denies any fever cough.  Patient does have chronic peripheral edema currently has lower extremity wraps in place.  Denies any increased edema recently per patient.  Denies any chest pain.  Largely negative review of systems.  States mild shortness of breath currently.  Currently on 2 L of oxygen satting 94%.  Respiratory rate of 23.   Past Medical History:  Diagnosis Date  . Anxiety   . Constipation   . Cystocele   . Depression   . Hemorrhoid   . Hypercholesterolemia   . Hypertension   . Incomplete bladder emptying   . Postmenopausal atrophic vaginitis   . Rectocele   . Thyroid disease   . UTI (lower urinary tract infection)   . Vaginal enterocele     Patient Active Problem List   Diagnosis Date Noted  . Hypothyroid 11/20/2018  . Protein calorie malnutrition ([place]) 11/20/2018  . AF (paroxysmal atrial fibrillation) ([place]) 11/20/2018  . Femur fracture, left ([place]) 11/19/2018  . UTI (urinary tract infection) 02/01/2017  . Chest pain 01/10/2017  . Dizziness 01/10/2017  . Ataxia 01/10/2017  . Cystocele, midline 07/27/2014  . Menopause 07/27/2014  . Vaginal atrophy 07/27/2014  . Incomplete bladder emptying 07/27/2014  . Anxiety and depression 07/27/2014  . Constipation 07/27/2014  . Hypertension 07/27/2014  . Hypercholesterolemia 07/27/2014  . Hemorrhoids 07/27/2014  . Rectocele 07/27/2014  . Vaginal enterocele 07/27/2014    Past Surgical History:  Procedure  Laterality Date  . APPENDECTOMY    . EYE SURGERY  2012   remve cataract  . INTRAMEDULLARY (IM) NAIL INTERTROCHANTERIC Left 11/20/2018   Procedure: INTRAMEDULLARY (IM) NAIL INTERTROCHANTRIC;  Surgeon: Corky Mull, MD;  Location: [facility] ORS;  Service: Orthopedics;  Laterality: Left;  . TOTAL ABDOMINAL HYSTERECTOMY  1977    Prior to Admission medications   Medication Sig Start Date End Date Taking? Authorizing Provider  acetaminophen (TYLENOL) 325 MG tablet Take 2 tablets (650 mg total) by mouth every 6 (six) hours as needed for mild pain or fever. 11/23/18   Ezekiel Slocumb, DO  alendronate (FOSAMAX) 70 MG tablet Take 70 mg by mouth once a week. Take with a full glass of water on an empty stomach.    [provider]  buPROPion (WELLBUTRIN SR) 100 MG 12 hr tablet Take 100 mg by mouth daily. 10/14/18   [provider]  calcium carbonate (TUMS - DOSED IN MG ELEMENTAL CALCIUM) 500 MG chewable tablet Chew 2 tablets (400 mg of elemental calcium total) by mouth 2 (two) times daily as needed for indigestion or heartburn. 11/23/18   Ezekiel Slocumb, DO  docusate sodium (COLACE) 100 MG capsule Take 1 capsule (100 mg total) by mouth 2 (two) times daily. 11/23/18   Nicole Kindred A, DO  donepezil (ARICEPT) 5 MG tablet Take 5 mg by mouth at bedtime. 11/18/18   [provider]  ELIQUIS 5 MG TABS tablet Take 5 mg by mouth 2 (two) times daily. 11/18/18   [provider]  feeding supplement, ENSURE ENLIVE, (ENSURE ENLIVE) LIQD Take 237 mLs by mouth 2 (two) times daily between meals. 11/23/18   Pennie Banter, DO  HYDROcodone-acetaminophen (NORCO/VICODIN) 5-325 MG tablet Take 1 tablet by mouth every 4 (four) hours as needed for moderate pain. 11/23/18   Pennie Banter, DO  levothyroxine (SYNTHROID, LEVOTHROID) 50 MCG tablet Take 50 mcg by mouth every morning.     [provider]  losartan (COZAAR) 50 MG tablet Take 50 mg by mouth 2 (two) times daily. 11/11/18    [provider]  meclizine (ANTIVERT) 12.5 MG tablet Take 1 tablet (12.5 mg total) by mouth 3 (three) times daily as needed for dizziness. 02/03/17   Katha Hamming, MD  metoprolol succinate (TOPROL-XL) 25 MG 24 hr tablet Take 25 mg by mouth daily. 10/28/18   [provider]  multivitamin-iron-minerals-folic acid (CENTRUM) chewable tablet Chew 1 tablet by mouth daily.    [provider]  ondansetron (ZOFRAN) 4 MG tablet Take 1 tablet (4 mg total) by mouth every 6 (six) hours as needed for nausea. 11/23/18   Esaw Grandchild A, DO  senna-docusate (SENOKOT-S) 8.6-50 MG tablet Take 1 tablet by mouth at bedtime as needed for moderate constipation. 11/23/18   Esaw Grandchild A, DO  sertraline (ZOLOFT) 100 MG tablet Take 100 mg by mouth daily. 08/20/18   [provider]    No Known Allergies  Family History  Problem Relation Age of Onset  . Heart attack Father   . Diabetes Brother   . Cancer Neg Hx     Social History Social History   Tobacco Use  . Smoking status: Former Games developer  . Smokeless tobacco: Never Used  Substance Use Topics  . Alcohol use: No  . Drug use: No    Review of Systems Constitutional: Negative for fever. Cardiovascular: Negative for chest pain. Respiratory: Mild shortness of breath. Gastrointestinal: Negative for abdominal pain Musculoskeletal: Negative for musculoskeletal complaints Neurological: Negative for headache All other ROS negative  ____________________________________________   PHYSICAL EXAM:  VITAL SIGNS: ED Triage Vitals  Enc Vitals Group     BP 03/23/19 1131 (!) 152/107     Pulse Rate 03/23/19 1131 81     Resp 03/23/19 1131 (!) 23     Temp 03/23/19 1134 98.1 F (36.7 C)     Temp Source 03/23/19 1134 Oral     SpO2 03/23/19 1131 94 %     Weight 03/23/19 1130 167 lb 1.7 oz (75.8 kg)     Height --      Head Circumference --      Peak Flow --      Pain Score 03/23/19 1131 0     Pain Loc --      Pain  Edu? --      Excl. in GC? --     Constitutional: Alert and oriented. Well appearing and in no distress. Eyes: Normal exam ENT      Head: Normocephalic and atraumatic.      Mouth/Throat: Mucous membranes are moist. Cardiovascular: Normal rate, regular rhythm.  Respiratory: Normal respiratory effort without tachypnea nor retractions. Breath sounds are clear Gastrointestinal: Soft and nontender. No distention.  Musculoskeletal: Nontender with normal range of motion in all extremities.  Neurologic:  Normal speech and language. No gross focal neurologic deficits Skin:  Skin is warm, dry and intact.  Psychiatric: Mood and affect are normal.   ____________________________________________    EKG  EKG viewed and interpreted by myself shows a normal sinus  rhythm 85 bpm with a slightly widened QRS, normal axis, largely normal intervals with nonspecific ST changes without ST elevation.  ____________________________________________    RADIOLOGY  X-ray shows right upper lobe and left upper lobe opacities concerning for multifocal infection.  Also interstitial edema suggesting CHF.  ____________________________________________   INITIAL IMPRESSION / ASSESSMENT AND PLAN / ED COURSE  Pertinent labs & imaging results that were available during my care of the patient were reviewed by me and considered in my medical decision making (see chart for details).   Presents to the emergency department for shortness of breath since this morning.  Overall the patient appears well she does require 2 L of oxygen to maintain sats in the mid 90s.  Denies any cough or fever.  We will check labs, chest x-ray and continue to closely monitor.  Patient agreeable to plan of care.  Patient is requiring 2 L of oxygen to maintain sats in the 90s which is new for the patient.  X-ray shows bilateral airspace opacities consistent with multifocal pneumonia in addition to the start opacities consistent with possible CHF.   Given the patient's lower extremity edema we will treat with IV Lasix.  We will cover with IV antibiotics and blood cultures and admit to the hospitalist service.  Reassuringly patient's temperature is normal and white blood cell count is normal.  I have added on a prolactin level to try to differentiate pneumonia versus asymmetric pulmonary edema.   Patient's BNP is resulted elevated greater than 1500.  Highly suspect the patient suffering more from pulmonary edema and true pneumonia however we will continue to cover with antibiotics until we can further delineate this.  I reviewed the patient's chart she is currently following up with Dr. Gwen Pounds, takes Eliquis for paroxysmal atrial fibrillation.  She has a history of peripheral edema but does not appear to have any pulmonary edema history, is only on hydrochlorothiazide for lower extremity edema.  Patient will receive IV Lasix in the emergency department and admitted to the hospital service.  Deborah Martinez was evaluated in Emergency Department on 03/23/2019 for the symptoms described in the history of present illness. She was evaluated in the context of the global COVID-19 pandemic, which necessitated consideration that the patient might be at risk for infection with the SARS-CoV-2 virus that causes COVID-19. Institutional protocols and algorithms that pertain to the evaluation of patients at risk for COVID-19 are in a state of rapid change based on information released by regulatory bodies including the CDC and federal and state organizations. These policies and algorithms were followed during the patient's care in the ED.  ____________________________________________   FINAL CLINICAL IMPRESSION(S) / ED DIAGNOSES  Dyspnea CHF exacerbation Multifocal pneumonia   Minna Antis, MD 03/23/19 1352    Minna Antis, MD 03/23/19 1408

## 2019-03-23 NOTE — ED Notes (Signed)
RN attempted to call report, Nurse Secretary for [facility] took RN number to give to receiving RN on [facility] to call when done providing patient care.

## 2019-03-23 NOTE — ED Triage Notes (Signed)
Pt from [facility] for [facility].  Per EMS sats in 80s at [facility].  sats 96 RN in ED but pt labored with increased WOB. Denies pain.  Dementia  Hx, disoriented to place at this time.  No known fevers. covid back before December and has had both covid shots.

## 2019-03-23 NOTE — ED Notes (Signed)
ED Nurse Secretary has called for Transport for patient.

## 2019-03-23 NOTE — Progress Notes (Signed)
Patient admitted to 257. Alert and oriented x2, disoriented to time and situation. On 2L nasal cannula. No complaints of pain or sob. Bed in lowest position, call bell within reach as well as the phone, yellow socks on. Spoke with [name], son, and HPOA to do admission profile. Patient and son do not remember if the pt has had the pneumonia vaccine but has had both covid vaccine shots and the flu shot. Patient has both legs wrapped tightly, almost look like UNNA boots. Left in place and placed wound consult incase it needs to be specially wrapped again by wound nurse. Otherwise skin intact with scattered ecchymosis BUE's.

## 2019-03-24 DIAGNOSIS — I480 Paroxysmal atrial fibrillation: Secondary | ICD-10-CM

## 2019-03-24 DIAGNOSIS — J9601 Acute respiratory failure with hypoxia: Secondary | ICD-10-CM

## 2019-03-24 LAB — BASIC METABOLIC PANEL
Anion gap: 9 (ref 5–15)
BUN: 18 mg/dL (ref 8–23)
CO2: 28 mmol/L (ref 22–32)
Calcium: 8.2 mg/dL — ABNORMAL LOW (ref 8.9–10.3)
Chloride: 98 mmol/L (ref 98–111)
Creatinine, Ser: 0.78 mg/dL (ref 0.44–1.00)
GFR calc Af Amer: >60 mL/min (ref >60)
GFR calc non Af Amer: >60 mL/min (ref >60)
Glucose, Bld: 110 mg/dL — ABNORMAL HIGH (ref 70–99)
Potassium: 3 mmol/L — ABNORMAL LOW (ref 3.5–5.1)
Sodium: 135 mmol/L (ref 135–145)

## 2019-03-24 LAB — ECHOCARDIOGRAM COMPLETE
Height: 66 in
Weight: 2564.8 oz

## 2019-03-24 LAB — MAGNESIUM: Magnesium: 1.9 mg/dL (ref 1.7–2.4)

## 2019-03-24 MED ORDER — POTASSIUM CHLORIDE CRYS ER 20 MEQ PO TBCR
40.0000 meq | EXTENDED_RELEASE_TABLET | Freq: Two times a day (BID) | ORAL | Status: DC
  Administered 2019-03-24 – 2019-03-28 (×9): 40 meq via ORAL
  Filled 2019-03-24 (×10): qty 2

## 2019-03-24 MED ORDER — MAGNESIUM SULFATE 2 GM/50ML IV SOLN
2.0000 g | Freq: Once | INTRAVENOUS | Status: AC
  Administered 2019-03-24: 2 g via INTRAVENOUS
  Filled 2019-03-24: qty 50

## 2019-03-24 NOTE — Progress Notes (Addendum)
PROGRESS NOTE    Deborah Martinez  MWN:[id number] DOB: [birth year] DOA: 03/23/2019 PCP: [place] Pink, MD   Brief Narrative: HPI: Deborah Martinez is a 86 y.o. female with medical history significant of atrial fibrillation, chronic anticoagulation on Eliquis, anxiety, hypertension, hyperlipidemia who is brought to the emergency department from her resident skilled nursing facility for shortness of breath.  Patient has been experiencing approximately 3 to 4 days of worsening shortness of breath.  She is on room air at baseline.  Patient has chronic peripheral edema and currently has lower extremity wraps in place however endorses that her lower extremities have become more edematous recently.  No chest pain.  On my arrival patient only endorses fatigue and only admits to shortness of breath on prompting.  She does not appear in extremis.  No use of accessory muscles.  Normal work of breathing.  Covid was taken and is negative.  3/11: Patient seen and examined.  Patient's husband is at bedside this morning.  Patient feels well.  Remains on 2 L.  Cardiology consult appreciated.  Unclear regarding volume status.  Ins and outs do not seem to correlate with daily weights.  Assessment & Plan:   Active Problems:   Acute decompensated heart failure (HCC)  Acute respiratory failure with hypoxia Likely secondary to fluid overloaded state Currently on 2 L Titrate off as tolerated  Fluid overload Suspected acute decompensated heart failure Patient with no known history of heart failure Does have a history of atrial fibrillation Last echocardiogram in 2018, EF 55% with grade 1 diastolic dysfunction Patient received 40 mg IV Lasix in ED Repeat echo pending Cardiology consult appreciated Plan: Continue IV Lasix 40 mg twice daily Target net negative fluid balance 1-1.5L/day Monitor renal function Follow-up 2D echocardiogram Continue Home Cozaar Continue home metoprolol Monitor and replace electrolytes,  K> 4; mag>2  Atrial fibrillation Chronic anticoagulation on Eliquis Patient is rate controlled at this time Chest pain-free Plan: Continue metoprolol succinate 25 mg daily per home dose Continue Eliquis  Essential hypertension Continue home Cozaar 50 mg twice daily Continue metoprolol succinate 25 mg daily Lasix as above for fluid overload  Anxiety Dementia/cognitive decline Continue home bupropion Continue home Aricept Continue home sertraline  Hypothyroidism Continue home Synthroid  Constipation As needed bowel regimen   DVT prophylaxis: Eliquis Code Status: Full Family Communication: Husband at bedside Disposition Plan: Anticipate return to previous home environment.  Will need at least 24 more hours of IV diuresis and review of echocardiogram.  Will need to return to euvolemia.  Dry weight is somewhat unclear at this time.  Consultants:  Cardiology- [facility]  Procedures:   2D echo  Antimicrobials:   none   Subjective: Seen and examined Husband at bedside Patient endorses symptomatic improvement  Objective: Vitals:   03/23/19 1935 03/24/19 0431 03/24/19 0805 03/24/19 1150  BP: 132/82 (!) 155/97 (!) 156/91 125/74  Pulse: 68 65 66 66  Resp: 18 16 17 16   Temp: (!) 97.4 F (36.3 C) 97.7 F (36.5 C) 97.7 F (36.5 C) 97.8 F (36.6 C)  TempSrc: Oral Oral Oral Oral  SpO2: 100% 99% 99% 99%  Weight:  71.9 kg    Height:        Intake/Output Summary (Last 24 hours) at 03/24/2019 1511 Last data filed at 03/24/2019 1153 Gross per 24 hour  Intake 339.91 ml  Output 1100 ml  Net -760.09 ml   Filed Weights   03/23/19 1130 03/23/19 1734 03/24/19 0431  Weight: 75.8 kg 72.7 kg  71.9 kg    Examination:  General exam: Appears calm and comfortable  Respiratory system: Lung sounds diminished bilaterally.  Scattered crackles.  Normal work of breathing Cardiovascular system: Regular rate, regular rhythm, no murmurs, 2+ pedal edema gastrointestinal system:  Abdomen is nondistended, soft and nontender. No organomegaly or masses felt. Normal bowel sounds heard. Central nervous system: Alert and oriented. No focal neurological deficits. Extremities: Symmetric 5 x 5 power.  Lower extremity in wraps bilaterally Skin: No rashes, lesions or ulcers Psychiatry: Judgement and insight appear normal. Mood & affect appropriate.     Data Reviewed: I have personally reviewed following labs and imaging studies  CBC: Recent Labs  Lab 03/23/19 1145  WBC 6.1  HGB 10.2*  HCT 32.2*  MCV 93.1  PLT 299   Basic Metabolic Panel: Recent Labs  Lab 03/23/19 1145 03/24/19 0445  NA 133* 135  K 3.6 3.0*  CL 97* 98  CO2 25 28  GLUCOSE 141* 110*  BUN 19 18  CREATININE 0.85 0.78  CALCIUM 8.6* 8.2*  MG  --  1.9   GFR: Estimated Creatinine Clearance: 48.4 mL/min (by C-G formula based on SCr of 0.78 mg/dL). Liver Function Tests: Recent Labs  Lab 03/23/19 1145  AST 30  ALT 17  ALKPHOS 74  BILITOT 1.1  PROT 5.9*  ALBUMIN 3.1*   No results for input(s): LIPASE, AMYLASE in the last 168 hours. No results for input(s): AMMONIA in the last 168 hours. Coagulation Profile: No results for input(s): INR, PROTIME in the last 168 hours. Cardiac Enzymes: No results for input(s): CKTOTAL, CKMB, CKMBINDEX, TROPONINI in the last 168 hours. BNP (last 3 results) No results for input(s): PROBNP in the last 8760 hours. HbA1C: No results for input(s): HGBA1C in the last 72 hours. CBG: No results for input(s): GLUCAP in the last 168 hours. Lipid Profile: No results for input(s): CHOL, HDL, LDLCALC, TRIG, CHOLHDL, LDLDIRECT in the last 72 hours. Thyroid Function Tests: No results for input(s): TSH, T4TOTAL, FREET4, T3FREE, THYROIDAB in the last 72 hours. Anemia Panel: No results for input(s): VITAMINB12, FOLATE, FERRITIN, TIBC, IRON, RETICCTPCT in the last 72 hours. Sepsis Labs: Recent Labs  Lab 03/23/19 1420  PROCALCITON <0.10    Recent Results (from the  past 240 hour(s))  Respiratory Panel by RT PCR (Flu A&B, Covid) - Nasopharyngeal Swab     Status: None   Collection Time: 03/23/19 11:54 AM   Specimen: Nasopharyngeal Swab  Result Value Ref Range Status   SARS Coronavirus 2 by RT PCR NEGATIVE NEGATIVE Final    Comment: (NOTE) SARS-CoV-2 target nucleic acids are NOT DETECTED. The SARS-CoV-2 RNA is generally detectable in upper respiratoy specimens during the acute phase of infection. The lowest concentration of SARS-CoV-2 viral copies this assay can detect is 131 copies/mL. A negative result does not preclude SARS-Cov-2 infection and should not be used as the sole basis for treatment or other patient management decisions. A negative result may occur with  improper specimen collection/handling, submission of specimen other than nasopharyngeal swab, presence of viral mutation(s) within the areas targeted by this assay, and inadequate number of viral copies (<131 copies/mL). A negative result must be combined with clinical observations, patient history, and epidemiological information. The expected result is Negative. Fact Sheet for Patients:  https://www.moore.com/ Fact Sheet for Healthcare Providers:  https://www.young.biz/ This test is not yet ap proved or cleared by the [place] FDA and  has been authorized for detection and/or diagnosis of SARS-CoV-2 by FDA under an Emergency Use  Authorization (EUA). This EUA will remain  in effect (meaning this test can be used) for the duration of the COVID-19 declaration under Section 564(b)(1) of the Act, 21 U.S.C. section 360bbb-3(b)(1), unless the authorization is terminated or revoked sooner.    Influenza A by PCR NEGATIVE NEGATIVE Final   Influenza B by PCR NEGATIVE NEGATIVE Final    Comment: (NOTE) The Xpert Xpress SARS-CoV-2/FLU/RSV assay is intended as an aid in  the diagnosis of influenza from Nasopharyngeal swab specimens and  should not be  used as a sole basis for treatment. Nasal washings and  aspirates are unacceptable for Xpert Xpress SARS-CoV-2/FLU/RSV  testing. Fact Sheet for Patients: https://www.moore.com/ Fact Sheet for Healthcare Providers: https://www.young.biz/ This test is not yet approved or cleared by the [place] FDA and  has been authorized for detection and/or diagnosis of SARS-CoV-2 by  FDA under an Emergency Use Authorization (EUA). This EUA will remain  in effect (meaning this test can be used) for the duration of the  Covid-19 declaration under Section 564(b)(1) of the Act, 21  U.S.C. section 360bbb-3(b)(1), unless the authorization is  terminated or revoked. Performed at [facility], [address] [place], [place] [address]   Blood culture (routine x 2)     Status: None (Preliminary result)   Collection Time: 03/23/19  1:53 PM   Specimen: BLOOD  Result Value Ref Range Status   Specimen Description BLOOD BLOOD LEFT ARM  Final   Special Requests   Final    BOTTLES DRAWN AEROBIC AND ANAEROBIC Blood Culture results may not be optimal due to an excessive volume of blood received in culture bottles   Culture   Final    NO GROWTH < 24 HOURS Performed at [facility], [address] [place], [place] [address]    Report Status PENDING  Incomplete  Blood culture (routine x 2)     Status: None (Preliminary result)   Collection Time: 03/23/19  1:58 PM   Specimen: BLOOD  Result Value Ref Range Status   Specimen Description BLOOD BLOOD LEFT HAND  Final   Special Requests   Final    BOTTLES DRAWN AEROBIC AND ANAEROBIC Blood Culture results may not be optimal due to an inadequate volume of blood received in culture bottles   Culture   Final    NO GROWTH < 24 HOURS Performed at [facility], [address], [place], [place] [address]    Report Status PENDING  Incomplete  MRSA PCR Screening     Status: None   Collection Time:  03/23/19  5:58 PM   Specimen: Nasopharyngeal  Result Value Ref Range Status   MRSA by PCR NEGATIVE NEGATIVE Final    Comment:        The GeneXpert MRSA Assay (FDA approved for NASAL specimens only), is one component of a comprehensive MRSA colonization surveillance program. It is not intended to diagnose MRSA infection nor to guide or monitor treatment for MRSA infections. Performed at [facility], [address], [place], [place] [address]          Radiology Studies: DG Chest Portable 1 View  Result Date: 03/23/2019 CLINICAL DATA:  Short of breath. EXAM: PORTABLE CHEST 1 VIEW COMPARISON:  11/19/2018 FINDINGS: Normal heart size. Aortic atherosclerosis. Bilateral pleural effusions identified left greater than right. The mild interstitial edema. Airspace disease within the right upper lobe and left lower lobe noted. IMPRESSION: Right upper lobe and left lower lobe airspace densities concerning for multifocal infection.  Followup PA and lateral chest X-ray is recommended in 3-4 weeks following trial of antibiotic therapy to ensure resolution and exclude underlying malignancy. Small pleural effusions and mild interstitial edema suggesting CHF. Aortic Atherosclerosis (ICD10-I70.0). Electronically Signed   By: Signa Kell M.D.   On: 03/23/2019 12:03        Scheduled Meds: . apixaban  5 mg Oral BID  . buPROPion  100 mg Oral Daily  . docusate sodium  100 mg Oral BID  . donepezil  5 mg Oral QHS  . feeding supplement (ENSURE ENLIVE)  237 mL Oral BID BM  . ferrous sulfate  325 mg Oral Q breakfast  . furosemide  40 mg Intravenous BID  . levothyroxine  50 mcg Oral BH-q7a  . losartan  50 mg Oral BID  . mouth rinse  15 mL Mouth Rinse BID  . metoprolol succinate  25 mg Oral Daily  . potassium chloride  40 mEq Oral BID  . sertraline  100 mg Oral Daily  . sodium chloride flush  3 mL Intravenous Q12H   Continuous Infusions: . sodium chloride Stopped (03/23/19 1807)      LOS: 1 day    Time spent: 35 minutes    Tresa Moore, MD [facility] Pager [phone]  If 7PM-7AM, please contact night-coverage 03/24/2019, 3:11 PM

## 2019-03-24 NOTE — Evaluation (Signed)
Physical Therapy Evaluation Patient Details Name: Deborah Martinez MRN: [id number] DOB: 06-Mar-1927 Today's Date: 03/24/2019   History of Present Illness  Pt is a 86 year old female admitted for acute decompensated heart failure in context of increased shortness of breath.  Pt's PMH includes dementia, anxiety, hypertension, hyperlipidemia, chronic peripheral edema, atrial fibrillation, and chronic anticoagulation.  Clinical Impression  Pt is a pleasant 86 year old female who was admitted for acute decompensated heart failure with complaints of SOB symptoms. Pt performs bed mobility with min assist and able to sit at EOB with cga. Pt becomes SOB with exertion despite O2 sats WNL and O2 donned. Unable to further perform transfers/ambulation at this time. Pt very confused asking "Do you know if I live with anyone? I'm not sure where I live, how crazy is that?" Pt demonstrates deficits with ROM (lacking functional knee flexion in order to perform transfers), strength, and cognition. Unsure of PLOF as pt is a poor historian. Would benefit from skilled PT to address above deficits and promote optimal return to PLOF; recommend transition to STR upon discharge from acute hospitalization.     Follow Up Recommendations SNF    Equipment Recommendations  None recommended by PT    Recommendations for Other Services       Precautions / Restrictions Precautions Precautions: Fall Precaution Comments: monitor HR and SpO2 Restrictions Weight Bearing Restrictions: No      Mobility  Bed Mobility Overal bed mobility: Needs Assistance Bed Mobility: Supine to Sit;Sit to Supine     Supine to sit: Min assist Sit to supine: Min assist   General bed mobility comments: able to initiate mobility but needs assist for trunk support and scooting out towards EOB. Once seated at EOB, upright posture. Becomes SOB with all exertion despite O2 sats WNL at 92% on RA. Replaced O2 on 2.5L with pt reporting SOB not improved.  Assisted pt back to supine position.  Transfers                 General transfer comment: further mobility deferred at this time due to SOB symptoms.  Ambulation/Gait                Stairs            Wheelchair Mobility    Modified Rankin (Stroke Patients Only)       Balance Overall balance assessment: Needs assistance Sitting-balance support: Bilateral upper extremity supported;Feet supported Sitting balance-Leahy Scale: Fair                                       Pertinent Vitals/Pain Pain Assessment: No/denies pain Faces Pain Scale: Hurts little more Pain Location: Pt grimaced when OTR assisted with LLE in bed mobility, pain quickly subsided Pain Descriptors / Indicators: Grimacing;Guarding Pain Intervention(s): Limited activity within patient's tolerance;Monitored during session    Home Living Family/patient expects to be discharged to:: Assisted living                 Additional Comments: Pt is a resident of [facility].  Unable to get additional details about living situation/home setup 2/2 pt's dementia and decreased recall.    Prior Function Level of Independence: Independent         Comments: Pt is a poor historian and admits to not being able to remember.  Pt reports being independent with basic self care,  and her facility provides meals.  Pt is not driving.     Hand Dominance        Extremity/Trunk Assessment   Upper Extremity Assessment Upper Extremity Assessment: Generalized weakness(B UE grossly 4/5)    Lower Extremity Assessment Lower Extremity Assessment: Generalized weakness(B LE grossly 3+/5)    Cervical / Trunk Assessment Cervical / Trunk Assessment: Normal  Communication   Communication: No difficulties  Cognition Arousal/Alertness: Awake/alert Behavior During Therapy: WFL for tasks assessed/performed Overall Cognitive Status: History of cognitive impairments - at  baseline                                 General Comments: Pt alert and oriented to name, reports she is at "some hospital". She is unable to provide any details about PLOF and is unsure of where she lives.      General Comments General comments (skin integrity, edema, etc.): BLE wrapped in compression garments, skin appears c/d/i.  Pt with increased work of breathing with bed mobility, HR and SpO2 recovered with rest and pursed lip breathing.    Exercises Other Exercises Other Exercises: supine ther-ex performed on B LE including AP, SLRs, and hip abd/add. All ther-ex performed x 10 reps with increased difficulty on L LE. Pt fatigues quickly. 10 reps performed with cga. Safe technique Other Exercises: provided min assist for bed mobility to manage LLE   Assessment/Plan    PT Assessment Patient needs continued PT services  PT Problem List Decreased strength;Decreased balance;Decreased mobility;Decreased activity tolerance;Decreased knowledge of use of DME;Decreased cognition;Cardiopulmonary status limiting activity       PT Treatment Interventions Gait training;DME instruction;Therapeutic activities;Therapeutic exercise;Balance training    PT Goals (Current goals can be found in the Care Plan section)  Acute Rehab PT Goals Patient Stated Goal: I want to breathe better PT Goal Formulation: Patient unable to participate in goal setting Time For Goal Achievement: 04/07/19 Potential to Achieve Goals: Good    Frequency Min 2X/week   Barriers to discharge        Co-evaluation               AM-PAC PT "6 Clicks" Mobility  Outcome Measure Help needed turning from your back to your side while in a flat bed without using bedrails?: A Little Help needed moving from lying on your back to sitting on the side of a flat bed without using bedrails?: A Little Help needed moving to and from a bed to a chair (including a wheelchair)?: A Lot Help needed standing up from a  chair using your arms (e.g., wheelchair or bedside chair)?: A Lot Help needed to walk in hospital room?: Total Help needed climbing 3-5 steps with a railing? : Total 6 Click Score: 12    End of Session Equipment Utilized During Treatment: Gait belt;Oxygen Activity Tolerance: Patient limited by fatigue Patient left: in bed;with bed alarm set Nurse Communication: Mobility status PT Visit Diagnosis: Muscle weakness (generalized) (M62.81);Difficulty in walking, not elsewhere classified (R26.2)    Time: 1510-1530 PT Time Calculation (min) (ACUTE ONLY): 20 min   Charges:   PT Evaluation $PT Eval Moderate Complexity: 1 Mod PT Treatments $Therapeutic Exercise: 8-22 mins        Greggory Stallion, PT, DPT [phone]   Airrion [place] 03/24/2019, 4:53 PM

## 2019-03-24 NOTE — Evaluation (Signed)
Occupational Therapy Evaluation Patient Details Name: Deborah Martinez MRN: [id number] DOB: 04-Jun-1927 Today's Date: 03/24/2019    History of Present Illness Pt is a 86 year old female admitted for acute decompensated heart failure in context of increased shortness of breath.  Pt's PMH includes dementia, anxiety, hypertension, hyperlipidemia, chronic peripheral edema, atrial fibrillation, and chronic anticoagulation.   Clinical Impression   Pt seen for OT evaluation in context of increased shortness of breath and CHF.  Pt presents with decreased occupational participation secondary to limited activity tolerance and generalized weakness.  Pt has a PMH of dementia, and has decreased recall of her living environment.  Was unable to obtain information about pt's home setup and occupational profile, but she does live at an ALF.  Pt with decreased recall about her living environment, but was oriented to conversation today and was pleasant and agreeable to therapy.  Pt requires min assist to manage LLE in bed mobility with HOB elevated.  Pt with increased work of breathing, dizziness, and fatigues while seated EOB, so deferred any further mobility.  Pt is able to reach feet for lower body dressing.  Anticipate stand by assist for seated ADLs, but unable to assess pt in functional transfers or mobility.  Recommend SNF after discharge at this time, but recommendation may change if pt's functional status changes or more information about caregiver support becomes available.    Follow Up Recommendations  SNF    Equipment Recommendations  3 in 1 bedside commode    Recommendations for Other Services       Precautions / Restrictions Precautions Precautions: Fall Precaution Comments: monitor HR and SpO2 Restrictions Weight Bearing Restrictions: No      Mobility Bed Mobility Overal bed mobility: Needs Assistance Bed Mobility: Supine to Sit;Sit to Supine     Supine to sit: Min assist Sit to supine:  Min assist   General bed mobility comments: provided min assist for BLE (LLE > RLE) management in bed mobility, HOB elevated  Transfers                 General transfer comment: unable to asses 2/2 pt politely declining OOB mobility    Balance Overall balance assessment: Needs assistance Sitting-balance support: Bilateral upper extremity supported;Feet supported Sitting balance-Leahy Scale: Fair                                     ADL either performed or assessed with clinical judgement   ADL Overall ADL's : Needs assistance/impaired                                       General ADL Comments: Unable to gain full ADL assessment 2/2 pt confusion and declining OOB activity.  Pt able to reach feet to don socks/shoes/pants, anticipate stand by assist for seated ADLs (i.e. feeding, grooming, upper body dressing, upper body bathing).     Vision Baseline Vision/History: Wears glasses Wears Glasses: At all times Patient Visual Report: Other (comment)(pt's glasses are missing- OTR searched room but pt states they are not at the hospital.  Pt reports decreased vision without her glasses.)       Perception     Praxis      Pertinent Vitals/Pain Pain Assessment: Faces Faces Pain Scale: Hurts little more Pain Location: Pt grimaced when OTR assisted with  LLE in bed mobility, pain quickly subsided Pain Descriptors / Indicators: Grimacing;Guarding Pain Intervention(s): Limited activity within patient's tolerance;Monitored during session     Hand Dominance     Extremity/Trunk Assessment Upper Extremity Assessment Upper Extremity Assessment: Overall WFL for tasks assessed(4/5 UE strength)   Lower Extremity Assessment Lower Extremity Assessment: Defer to PT evaluation   Cervical / Trunk Assessment Cervical / Trunk Assessment: Normal   Communication Communication Communication: No difficulties   Cognition Arousal/Alertness:  Awake/alert Behavior During Therapy: WFL for tasks assessed/performed Overall Cognitive Status: History of cognitive impairments - at baseline                                 General Comments: Pt with PMH of dementia.  Pt oriented to month and year, unable to name the day of the week.  Answered "rehab facility" when asked where she was.  Pt unable to recall any details about her living environment, including name of ALF.  Pt is oriented to conversation, pleasant and engaged in therapy.   General Comments  BLE wrapped in compression garments, skin appears c/d/i.  Pt with increased work of breathing with bed mobility, HR and SpO2 recovered with rest and pursed lip breathing.    Exercises Other Exercises Other Exercises: provided education re: safety and fall precautions, self care, bed mobility, energy conservation, and OT role and POC Other Exercises: provided min assist for bed mobility to manage LLE   Shoulder Instructions      Home Living Family/patient expects to be discharged to:: Assisted living                                 Additional Comments: Pt is a resident of [facility].  Unable to get additional details about living situation/home setup 2/2 pt's dementia and decreased recall.      Prior Functioning/Environment          Comments: Pt is a poor historian and admits to not being able to remember.  Pt reports being independent with basic self care, and her facility provides meals.  Pt is not driving.        OT Problem List: Decreased strength;Decreased range of motion;Decreased activity tolerance;Impaired balance (sitting and/or standing);Decreased cognition;Decreased safety awareness;Decreased knowledge of use of DME or AE;Decreased knowledge of precautions;Cardiopulmonary status limiting activity;Increased edema      OT Treatment/Interventions: Self-care/ADL training;Therapeutic exercise;Energy conservation;DME  and/or AE instruction;Therapeutic activities;Cognitive remediation/compensation;Patient/family education;Balance training    OT Goals(Current goals can be found in the care plan section) Acute Rehab OT Goals Patient Stated Goal: "to do what I used to be able to do" OT Goal Formulation: With patient Time For Goal Achievement: 04/07/19 Potential to Achieve Goals: Good ADL Goals Pt Will Perform Lower Body Dressing: with modified independence;sit to/from stand;with adaptive equipment(with LRAD) Pt Will Transfer to Toilet: with supervision;ambulating;bedside commode(with LRAD) Additional ADL Goal #1: Pt will verbalize x3 fall prevention strategies with min verbal cues for improved safety and independence in ADL Additional ADL Goal #2: Pt will verbalize x3 energy conservation strategies with min verbal cues for increased safety and independence in ADL  OT Frequency: Min 2X/week   Barriers to D/C:            Co-evaluation              AM-PAC OT "6 Clicks" Daily Activity  Outcome Measure Help from another person eating meals?: A Little Help from another person taking care of personal grooming?: A Little Help from another person toileting, which includes using toliet, bedpan, or urinal?: A Little Help from another person bathing (including washing, rinsing, drying)?: A Lot Help from another person to put on and taking off regular upper body clothing?: A Little Help from another person to put on and taking off regular lower body clothing?: A Lot 6 Click Score: 16   End of Session    Activity Tolerance: Patient tolerated treatment well Patient left: in bed;with call bell/phone within reach;with bed alarm set  OT Visit Diagnosis: Other abnormalities of gait and mobility (R26.89);Muscle weakness (generalized) (M62.81);Other symptoms and signs involving cognitive function                Time: 6244-6950 OT Time Calculation (min): 30 min Charges:  OT General Charges $OT Visit: 1  Visit OT Evaluation $OT Eval Moderate Complexity: 1 Mod OT Treatments $Self Care/Home Management : 8-22 mins $Therapeutic Activity: 8-22 mins  Kathyrn Drown Joany Khatib, OTR/L 03/24/19, 2:51 PM

## 2019-03-24 NOTE — Consult Note (Addendum)
WOC Nurse Consult Note: Reason for Consult: Consult requested for bilat leg wraps.  Pt has been wearing Una boots prior to admission; removed and assessed legs.  No open wounds or drainage, generalized edema.  Dressing procedure/placement/frequency: Applied bilat Una boots and coban; these will need to be changed weekly.  WOC team will change them next Thurs if patient is still in the hospital at that time.  Patient should resume previous plan of care for compression wrap changes after discharge.  Cammie Mcgee MSN, RN, CWOCN, [place], CNS [phone]

## 2019-03-24 NOTE — Consult Note (Signed)
[facility] Cardiology  CARDIOLOGY CONSULT NOTE  Patient ID: Deborah Martinez MRN: [id number] DOB/AGE: [birth year] 84 y.o.  Admit date: 03/23/2019 Referring Physician Georgeann Oppenheim Primary Physician [facility] Primary Cardiologist Gwen Pounds Reason for Consultation congestive heart failure  HPI: 86 year old female referred for evaluation of congestive heart failure.  Patient was brought to [facility] ED from skilled nursing facility for apparent shortness of breath.  Patient has significant dementia, denies chest pain or shortness of breath.  She was noted to have 3 to 4 days of worsening shortness of breath with chronic peripheral edema of lower extremities wrapped every day.  In the emergency room, patient was noted to be hypoxic, with room air oxygen saturations of 86%.  Patient was placed on 2 L of supplemental oxygen.  Patient labs notable for BNP 1420.  High-sensitivity troponin were negative at 33 and 28.  ECG revealed atrial fibrillation at a rate of 85 bpm with nonspecific ST-T wave abnormalities.  Patient has a history of atrial fibrillation, on Eliquis for stroke prevention.  Chest x-ray revealed right upper lobe and left lower lobe densities consistent with multifocal pneumonia.  Most recent 2D echocardiogram 01/01/2017 revealed LVEF of 55%.  Lexi scan Myoview 10/20/2017 not reveal evidence for significant scar or ischemia.  Review of systems complete and found to be negative unless listed above     Past Medical History:  Diagnosis Date  . Anxiety   . Constipation   . Cystocele   . Depression   . Hemorrhoid   . Hypercholesterolemia   . Hypertension   . Incomplete bladder emptying   . Postmenopausal atrophic vaginitis   . Rectocele   . Thyroid disease   . UTI (lower urinary tract infection)   . Vaginal enterocele     Past Surgical History:  Procedure Laterality Date  . APPENDECTOMY    . EYE SURGERY  2012   remve cataract  . INTRAMEDULLARY (IM) NAIL INTERTROCHANTERIC Left 11/20/2018   Procedure:  INTRAMEDULLARY (IM) NAIL INTERTROCHANTRIC;  Surgeon: Christena Flake, MD;  Location: [facility] ORS;  Service: Orthopedics;  Laterality: Left;  . TOTAL ABDOMINAL HYSTERECTOMY  1977    Medications Prior to Admission  Medication Sig Dispense Refill Last Dose  . acetaminophen (TYLENOL) 325 MG tablet Take 2 tablets (650 mg total) by mouth every 6 (six) hours as needed for mild pain or fever.   prn at prn  . alendronate (FOSAMAX) 70 MG tablet Take 70 mg by mouth once a week. Take with a full glass of water on an empty stomach.   03/23/2019 at 1000  . Amino Acids-Protein Hydrolys (FEEDING SUPPLEMENT, PRO-STAT SUGAR FREE 64,) LIQD Take 30 mLs by mouth daily.   03/23/2019 at 0900  . buPROPion (WELLBUTRIN SR) 100 MG 12 hr tablet Take 100 mg by mouth daily.   03/23/2019 at 0900  . calcium carbonate (TUMS - DOSED IN MG ELEMENTAL CALCIUM) 500 MG chewable tablet Chew 2 tablets (400 mg of elemental calcium total) by mouth 2 (two) times daily as needed for indigestion or heartburn. (Patient taking differently: Chew 2 tablets by mouth in the morning and at bedtime. )   03/23/2019 at 1000  . docusate sodium (COLACE) 100 MG capsule Take 1 capsule (100 mg total) by mouth 2 (two) times daily. 10 capsule 0 03/23/2019 at 0900  . donepezil (ARICEPT) 5 MG tablet Take 5 mg by mouth at bedtime.   03/22/2019 at 2000  . ELIQUIS 5 MG TABS tablet Take 5 mg by mouth 2 (two) times daily.  03/23/2019 at 0900  . feeding supplement, ENSURE ENLIVE, (ENSURE ENLIVE) LIQD Take 237 mLs by mouth 2 (two) times daily between meals. 237 mL 12 03/23/2019 at 1000  . ferrous sulfate 325 (65 FE) MG tablet Take 325 mg by mouth daily with breakfast.   03/23/2019 at 0900  . hydrochlorothiazide (MICROZIDE) 12.5 MG capsule Take 12.5 mg by mouth daily.   03/23/2019 at 0900  . HYDROcodone-acetaminophen (NORCO/VICODIN) 5-325 MG tablet Take 1 tablet by mouth every 4 (four) hours as needed for moderate pain. 42 tablet 0 prn at prn  . levothyroxine (SYNTHROID, LEVOTHROID) 50  MCG tablet Take 50 mcg by mouth every morning.    03/23/2019 at 0600  . losartan (COZAAR) 50 MG tablet Take 50 mg by mouth 2 (two) times daily.   03/23/2019 at 0900  . meclizine (ANTIVERT) 12.5 MG tablet Take 1 tablet (12.5 mg total) by mouth 3 (three) times daily as needed for dizziness. 30 tablet 0 prn at prn  . metoprolol succinate (TOPROL-XL) 25 MG 24 hr tablet Take 25 mg by mouth daily.   03/23/2019 at 0900  . multivitamin-iron-minerals-folic acid (CENTRUM) chewable tablet Chew 1 tablet by mouth daily.   03/23/2019 at 0900  . ondansetron (ZOFRAN) 4 MG tablet Take 1 tablet (4 mg total) by mouth every 6 (six) hours as needed for nausea. 20 tablet 0 prn at prn  . phenylephrine-shark liver oil-mineral oil-petrolatum (PREPARATION H) 0.25-3-14-71.9 % rectal ointment Place 1 application rectally 4 (four) times daily as needed for hemorrhoids.   prn at prn  . senna-docusate (SENOKOT-S) 8.6-50 MG tablet Take 1 tablet by mouth at bedtime as needed for moderate constipation.   prn at prn  . sertraline (ZOLOFT) 100 MG tablet Take 100 mg by mouth daily.   03/22/2019 at [place] History  . Marital status: Married    Spouse name: Not on file  . Number of children: Not on file  . Years of education: Not on file  . Highest education level: Not on file  Occupational History  . Not on file  Tobacco Use  . Smoking status: Former Research scientist (life sciences)  . Smokeless tobacco: Never Used  Substance and Sexual Activity  . Alcohol use: No  . Drug use: No  . Sexual activity: Not Currently  Other Topics Concern  . Not on file  Social History Narrative  . Not on file   Social Determinants of Health   Financial Resource Strain:   . Difficulty of Paying Living Expenses:   Food Insecurity:   . Worried About Charity fundraiser in the Last Year:   . Arboriculturist in the Last Year:   Transportation Needs:   . Film/video editor (Medical):   Marland Kitchen Lack of Transportation (Non-Medical):   Physical  Activity:   . Days of Exercise per Week:   . Minutes of Exercise per Session:   Stress:   . Feeling of Stress :   Social Connections:   . Frequency of Communication with Friends and Family:   . Frequency of Social Gatherings with Friends and Family:   . Attends Religious Services:   . Active Member of Clubs or Organizations:   . Attends Archivist Meetings:   Marland Kitchen Marital Status:   Intimate Partner Violence:   . Fear of Current or Ex-Partner:   . Emotionally Abused:   Marland Kitchen Physically Abused:   . Sexually Abused:     Family History  Problem Relation  Age of Onset  . Heart attack Father   . Diabetes Brother   . Cancer Neg Hx       Review of systems complete and found to be negative unless listed above      PHYSICAL EXAM  General: Well developed, well nourished, in no acute distress HEENT:  Normocephalic and atramatic Neck:  No JVD.  Lungs: Clear bilaterally to auscultation and percussion. Heart: HRRR . Normal S1 and S2 without gallops or murmurs.  Abdomen: Bowel sounds are positive, abdomen soft and non-tender  Msk:  Back normal, normal gait. Normal strength and tone for age. Extremities: No clubbing, cyanosis or edema.   Neuro: Alert and oriented X 3. Psych:  Good affect, responds appropriately  Labs:   Lab Results  Component Value Date   WBC 6.1 03/23/2019   HGB 10.2 (L) 03/23/2019   HCT 32.2 (L) 03/23/2019   MCV 93.1 03/23/2019   PLT 299 03/23/2019    Recent Labs  Lab 03/23/19 1145 03/23/19 1145 03/24/19 0445  NA 133*   < > 135  K 3.6   < > 3.0*  CL 97*   < > 98  CO2 25   < > 28  BUN 19   < > 18  CREATININE 0.85   < > 0.78  CALCIUM 8.6*   < > 8.2*  PROT 5.9*  --   --   BILITOT 1.1  --   --   ALKPHOS 74  --   --   ALT 17  --   --   AST 30  --   --   GLUCOSE 141*   < > 110*   < > = values in this interval not displayed.   Lab Results  Component Value Date   TROPONINI <0.03 09/02/2017    Lab Results  Component Value Date   CHOL 186  01/31/2017   Lab Results  Component Value Date   HDL 76 01/31/2017   Lab Results  Component Value Date   LDLCALC 88 01/31/2017   Lab Results  Component Value Date   TRIG 108 01/31/2017   Lab Results  Component Value Date   CHOLHDL 2.4 01/31/2017   No results found for: LDLDIRECT    Radiology: DG Chest Portable 1 View  Result Date: 03/23/2019 CLINICAL DATA:  Short of breath. EXAM: PORTABLE CHEST 1 VIEW COMPARISON:  11/19/2018 FINDINGS: Normal heart size. Aortic atherosclerosis. Bilateral pleural effusions identified left greater than right. The mild interstitial edema. Airspace disease within the right upper lobe and left lower lobe noted. IMPRESSION: Right upper lobe and left lower lobe airspace densities concerning for multifocal infection. Followup PA and lateral chest X-ray is recommended in 3-4 weeks following trial of antibiotic therapy to ensure resolution and exclude underlying malignancy. Small pleural effusions and mild interstitial edema suggesting CHF. Aortic Atherosclerosis (ICD10-I70.0). Electronically Signed   By: Signa Kell M.D.   On: 03/23/2019 12:03    EKG: Atrial fibrillation at a rate 85 bpm  ASSESSMENT AND PLAN:   1.  Respiratory failure.  Likely multifactorial, secondary to acute on chronic diastolic congestive heart failure and possible community-acquired pneumonia.  Patient on 2 L O2 by nasal cannula. 2.  Acute on chronic diastolic congestive heart failure.  Patient with bilateral lower extremity peripheral edema, moderately elevated BNP 1520. 3.  Chronic atrial fibrillation.  Patient has a history of atrial fibrillation on Eliquis for stroke prevention.  ECG reveals atrial fibrillation with rate well controlled on metoprolol succinate 25  mg daily. 4.  Dementia.  Currently a resident at skilled nursing facility.  She is alert, oriented to name only.  Recommendations  1.  Agree with overall current therapy 2.  Continue diuresis 3.  Carefully monitor  renal status 4.  Continue Eliquis for stroke prevention 5.  Continue metoprolol succinate and losartan 6.  Review 2D echocardiogram 7.  Further recommendations pending patient's initial clinical course and 2D echocardiogram results  Signed: Marcina Millard MD,PhD, [facility] 03/24/2019, 8:32 AM

## 2019-03-25 LAB — BASIC METABOLIC PANEL
Anion gap: 9 (ref 5–15)
BUN: 24 mg/dL — ABNORMAL HIGH (ref 8–23)
CO2: 29 mmol/L (ref 22–32)
Calcium: 8.4 mg/dL — ABNORMAL LOW (ref 8.9–10.3)
Chloride: 98 mmol/L (ref 98–111)
Creatinine, Ser: 0.89 mg/dL (ref 0.44–1.00)
GFR calc Af Amer: >60 mL/min (ref >60)
GFR calc non Af Amer: 57 mL/min — ABNORMAL LOW (ref >60)
Glucose, Bld: 103 mg/dL — ABNORMAL HIGH (ref 70–99)
Potassium: 3.5 mmol/L (ref 3.5–5.1)
Sodium: 136 mmol/L (ref 135–145)

## 2019-03-25 LAB — TSH: TSH: 4.118 u[IU]/mL (ref 0.350–4.500)

## 2019-03-25 LAB — MAGNESIUM: Magnesium: 2 mg/dL (ref 1.7–2.4)

## 2019-03-25 MED ORDER — METOPROLOL SUCCINATE ER 50 MG PO TB24
50.0000 mg | ORAL_TABLET | Freq: Every day | ORAL | Status: DC
  Administered 2019-03-25 – 2019-03-28 (×4): 50 mg via ORAL
  Filled 2019-03-25 (×4): qty 1

## 2019-03-25 MED ORDER — POTASSIUM CHLORIDE CRYS ER 20 MEQ PO TBCR
20.0000 meq | EXTENDED_RELEASE_TABLET | Freq: Once | ORAL | Status: AC
  Administered 2019-03-25: 20 meq via ORAL

## 2019-03-25 MED ORDER — FUROSEMIDE 10 MG/ML IJ SOLN
40.0000 mg | Freq: Three times a day (TID) | INTRAMUSCULAR | Status: DC
  Administered 2019-03-25 – 2019-03-27 (×6): 40 mg via INTRAVENOUS
  Filled 2019-03-25 (×6): qty 4

## 2019-03-25 NOTE — Progress Notes (Signed)
[facility] Cardiology  SUBJECTIVE: Patient laying in bed, denies chest pain or shortness of breath   Vitals:   03/24/19 1947 03/25/19 0423 03/25/19 0425 03/25/19 0803  BP: (!) 138/92 137/87  (!) 158/91  Pulse: 71 72  79  Resp: 18 18  18   Temp: 98.1 F (36.7 C) 98.1 F (36.7 C)  98.2 F (36.8 C)  TempSrc:      SpO2: 99% 98%  98%  Weight:   70.9 kg   Height:         Intake/Output Summary (Last 24 hours) at 03/25/2019 0818 Last data filed at 03/25/2019 0429 Gross per 24 hour  Intake 720 ml  Output 2000 ml  Net -1280 ml      PHYSICAL EXAM  General: Well developed, well nourished, in no acute distress HEENT:  Normocephalic and atramatic Neck:  No JVD.  Lungs: Clear bilaterally to auscultation and percussion. Heart: HRRR . Normal S1 and S2 without gallops or murmurs.  Abdomen: Bowel sounds are positive, abdomen soft and non-tender  Msk:  Back normal, normal gait. Normal strength and tone for age. Extremities: No clubbing, cyanosis or edema.   Neuro: Alert and oriented X 3. Psych:  Good affect, responds appropriately   LABS: Basic Metabolic Panel: Recent Labs    03/24/19 0445 03/25/19 0316  NA 135 136  K 3.0* 3.5  CL 98 98  CO2 28 29  GLUCOSE 110* 103*  BUN 18 24*  CREATININE 0.78 0.89  CALCIUM 8.2* 8.4*  MG 1.9 2.0   Liver Function Tests: Recent Labs    03/23/19 1145  AST 30  ALT 17  ALKPHOS 74  BILITOT 1.1  PROT 5.9*  ALBUMIN 3.1*   No results for input(s): LIPASE, AMYLASE in the last 72 hours. CBC: Recent Labs    03/23/19 1145  WBC 6.1  HGB 10.2*  HCT 32.2*  MCV 93.1  PLT 299   Cardiac Enzymes: No results for input(s): CKTOTAL, CKMB, CKMBINDEX, TROPONINI in the last 72 hours. BNP: Invalid input(s): POCBNP D-Dimer: No results for input(s): DDIMER in the last 72 hours. Hemoglobin A1C: No results for input(s): HGBA1C in the last 72 hours. Fasting Lipid Panel: No results for input(s): CHOL, HDL, LDLCALC, TRIG, CHOLHDL, LDLDIRECT in the last 72  hours. Thyroid Function Tests: Recent Labs    03/25/19 0316  TSH 4.118   Anemia Panel: No results for input(s): VITAMINB12, FOLATE, FERRITIN, TIBC, IRON, RETICCTPCT in the last 72 hours.  DG Chest Portable 1 View  Result Date: 03/23/2019 CLINICAL DATA:  Short of breath. EXAM: PORTABLE CHEST 1 VIEW COMPARISON:  11/19/2018 FINDINGS: Normal heart size. Aortic atherosclerosis. Bilateral pleural effusions identified left greater than right. The mild interstitial edema. Airspace disease within the right upper lobe and left lower lobe noted. IMPRESSION: Right upper lobe and left lower lobe airspace densities concerning for multifocal infection. Followup PA and lateral chest X-ray is recommended in 3-4 weeks following trial of antibiotic therapy to ensure resolution and exclude underlying malignancy. Small pleural effusions and mild interstitial edema suggesting CHF. Aortic Atherosclerosis (ICD10-I70.0). Electronically Signed   By: 13/06/2018 M.D.   On: 03/23/2019 12:03   ECHOCARDIOGRAM COMPLETE  Result Date: 03/24/2019    ECHOCARDIOGRAM REPORT   Patient Name:   Deborah Martinez Date of Exam: 03/23/2019 Medical Rec #:  05/23/2019    Height:       66.0 in Accession #:    [id number]   Weight:       160.3 lb Date  of Birth:  [birth year]    BSA:          1.820 m Patient Age:    86 years     BP:           152/103 mmHg Patient Gender: F            HR:           81 bpm. Exam Location:  [facility] Procedure: 2D Echo, Cardiac Doppler and Color Doppler Indications:     I50.21 Acute Systolic Congestive Heart Failure  History:         Patient has prior history of Echocardiogram examinations, most                  recent 01/11/2017. Risk Factors:Hypertension and Dyslipidemia.                  Thyroid disease.  Sonographer:     Sedonia Small Rodgers-Jones Referring Phys:  [id number] Tresa Moore Diagnosing Phys: Marcina Millard MD IMPRESSIONS  1. Left ventricular ejection fraction, by estimation, is 25 to 30%. The left ventricle  has severely decreased function. The left ventricle has no regional wall motion abnormalities. Left ventricular diastolic parameters were normal.  2. Right ventricular systolic function is normal. The right ventricular size is normal. There is moderately elevated pulmonary artery systolic pressure.  3. The mitral valve is normal in structure. Moderate to severe mitral valve regurgitation. No evidence of mitral stenosis.  4. Tricuspid valve regurgitation is moderate to severe.  5. The aortic valve is normal in structure. Aortic valve regurgitation is not visualized. No aortic stenosis is present.  6. The inferior vena cava is normal in size with greater than 50% respiratory variability, suggesting right atrial pressure of 3 mmHg. FINDINGS  Left Ventricle: Left ventricular ejection fraction, by estimation, is 25 to 30%. The left ventricle has severely decreased function. The left ventricle has no regional wall motion abnormalities. The left ventricular internal cavity size was normal in size. There is no left ventricular hypertrophy. Left ventricular diastolic parameters were normal. Right Ventricle: The right ventricular size is normal. No increase in right ventricular wall thickness. Right ventricular systolic function is normal. There is moderately elevated pulmonary artery systolic pressure. The tricuspid regurgitant velocity is 3.60 m/s, and with an assumed right atrial pressure of 10 mmHg, the estimated right ventricular systolic pressure is 61.8 mmHg. Left Atrium: Left atrial size was normal in size. Right Atrium: Right atrial size was normal in size. Pericardium: There is no evidence of pericardial effusion. Mitral Valve: The mitral valve is normal in structure. Normal mobility of the mitral valve leaflets. Moderate to severe mitral valve regurgitation. No evidence of mitral valve stenosis. Tricuspid Valve: The tricuspid valve is normal in structure. Tricuspid valve regurgitation is moderate to severe. No  evidence of tricuspid stenosis. Aortic Valve: The aortic valve is normal in structure. Aortic valve regurgitation is not visualized. No aortic stenosis is present. Pulmonic Valve: The pulmonic valve was normal in structure. Pulmonic valve regurgitation is not visualized. No evidence of pulmonic stenosis. Aorta: The aortic root is normal in size and structure. Venous: The inferior vena cava is normal in size with greater than 50% respiratory variability, suggesting right atrial pressure of 3 mmHg. IAS/Shunts: No atrial level shunt detected by color flow Doppler.  LEFT VENTRICLE PLAX 2D LVIDd:         5.36 cm  Diastology LVIDs:         4.63 cm  LV e'  lateral:   3.70 cm/s LV PW:         0.82 cm  LV E/e' lateral: 25.3 LV IVS:        0.81 cm  LV e' medial:    4.03 cm/s LVOT diam:     1.90 cm  LV E/e' medial:  23.2 LV SV:         36 LV SV Index:   20 LVOT Area:     2.84 cm  RIGHT VENTRICLE RV Basal diam:  4.20 cm RV S prime:     16.00 cm/s TAPSE (M-mode): 1.9 cm LEFT ATRIUM             Index       RIGHT ATRIUM           Index LA diam:        3.50 cm 1.92 cm/m  RA Area:     14.00 cm LA Vol (A2C):   68.1 ml 37.41 ml/m RA Volume:   37.90 ml  20.82 ml/m LA Vol (A4C):   57.3 ml 31.48 ml/m LA Biplane Vol: 63.9 ml 35.10 ml/m  AORTIC VALVE LVOT Vmax:   79.10 cm/s LVOT Vmean:  52.700 cm/s LVOT VTI:    0.126 m  AORTA Ao Root diam: 3.40 cm MITRAL VALVE               TRICUSPID VALVE MV Area (PHT): 4.68 cm    TR Peak grad:   51.8 mmHg MV Decel Time: 162 msec    TR Vmax:        360.00 cm/s MV E velocity: 93.60 cm/s MV A velocity: 84.40 cm/s  SHUNTS MV E/A ratio:  1.11        Systemic VTI:  0.13 m                            Systemic Diam: 1.90 cm Isaias Cowman MD Electronically signed by Isaias Cowman MD Signature Date/Time: 03/24/2019/4:10:37 PM    Final      Echo LVEF 25 to 30%  TELEMETRY: Normal sinus rhythm at 75 bpm:  ASSESSMENT AND PLAN:  Active Problems:   Acute decompensated heart failure (HCC)     1.  Respiratory failure, likely multifactorial, secondary to to acute on chronic systolic congestive heart failure, and possible community-acquired pneumonia, on 2 L O2 by nasal cannula 2.  Acute on chronic systolic congestive heart failure.  2D echocardiogram reveals moderate to severely dense left ventricular function, with LVEF 25 to 30%.  Patient presents with bilateral lower extremity peripheral edema, with moderately elevated BNP 1520.  Patient clinically responding to diuresis with IV furosemide. 3.  Chronic atrial fibrillation.  Patient has a history of atrial fibrillation on Eliquis for stroke prevention.  ECG revealed atrial fibrillation with heart rate well controlled on metoprolol succinate. 4.  Nonsustained VT, 15 beats, asymptomatic, without recurrence  Recommendations  1.  Agree with current therapy 2.  Continue diuresis 3.  Carefully monitor renal status 4.  Continue Eliquis for stroke prevention 5.  Uptitrate metoprolol succinate to 50 mg daily 6.  Continue losartan 7.  No further recommendations at this time 8.  Follow-up with Dr. Nehemiah Massed in 1 week  Sign off for now, please call if any questions   Isaias Cowman, MD, PhD, [facility] 03/25/2019 8:18 AM

## 2019-03-25 NOTE — Plan of Care (Signed)
?  Problem: Activity: ?Goal: Capacity to carry out activities will improve ?Outcome: Progressing ?  ?

## 2019-03-25 NOTE — Progress Notes (Signed)
PROGRESS NOTE    [name]  POE:[id number] DOB: [birth year] DOA: 03/23/2019 PCP: [name], MD   Brief Narrative: HPI: Deborah Martinez is a 86 y.o. female with medical history significant of atrial fibrillation, chronic anticoagulation on Eliquis, anxiety, hypertension, hyperlipidemia who is brought to the emergency department from her resident skilled nursing facility for shortness of breath.  Patient has been experiencing approximately 3 to 4 days of worsening shortness of breath.  She is on room air at baseline.  Patient has chronic peripheral edema and currently has lower extremity wraps in place however endorses that her lower extremities have become more edematous recently.  No chest pain.  On my arrival patient only endorses fatigue and only admits to shortness of breath on prompting.  She does not appear in extremis.  No use of accessory muscles.  Normal work of breathing.  Covid was taken and is negative.  3/11: Patient seen and examined.  Patient's husband is at bedside this morning.  Patient feels well.  Remains on 2 L.  Cardiology consult appreciated.  Unclear regarding volume status.  Ins and outs do not seem to correlate with daily weights.  3/12: Patient seen and examined.  No family at bedside this morning.  Patient feels well.  Remains on 2 L oxygen.  Cardiology consult appreciated.  Volume status net negative.  Assessment & Plan:   Active Problems:   Acute decompensated heart failure (HCC)  Acute respiratory failure with hypoxia Likely secondary to fluid overloaded state Currently on 2 L Titrate off as tolerated  Fluid overload Acute decompensated systolic heart failure Patient with no known history of heart failure Does have a history of atrial fibrillation Last echocardiogram in 2018, EF 55% with grade 1 diastolic dysfunction 2D echocardiogram: LVEF 25 to 30% Patient received 40 mg IV Lasix in ED Cardiology consult appreciated Plan: Increase Lasix to 40 mg 3  times daily Target net negative fluid balance 1-1.5L/day Consider addition of metolazone if patient is not able to effectively diurese Monitor renal function Continue Home Cozaar Uptitrate metoprolol to 50 mg daily Monitor and replace electrolytes, K> 4; mag>2  Atrial fibrillation Chronic anticoagulation on Eliquis Patient is rate controlled at this time Chest pain-free Plan: Continue metoprolol succinate 25 mg daily per home dose Continue Eliquis  Essential hypertension Continue home Cozaar 50 mg twice daily Continue metoprolol succinate 50 mg daily Lasix as above for fluid overload  Anxiety Dementia/cognitive decline Continue home bupropion Continue home Aricept Continue home sertraline  Hypothyroidism Continue home Synthroid  Constipation As needed bowel regimen   DVT prophylaxis: Eliquis Code Status: Full Family Communication: None today Disposition Plan: Anticipate return to previous home environment.  Newly diagnosed systolic heart failure with EF of 25%.  Patient continues to be oxygen dependent.  We will continue to diurese aggressively.  Goal to wean entirely from supplmental oxygen prior to discharge.  PT has recommended SNF placement.  TOC aware and is looking for options.  Consultants:  Cardiology- Paraschos (signed off 3/12)  Procedures:   2D echo  Antimicrobials:   none   Subjective: Seen and examined No acute events No new complaints   Objective: Vitals:   03/25/19 0425 03/25/19 0803 03/25/19 1216 03/25/19 1533  BP:  (!) 158/91 114/89 (!) 157/99  Pulse:  79 71 73  Resp:  18 16 16   Temp:  98.2 F (36.8 C) 98.3 F (36.8 C) 98.1 F (36.7 C)  TempSrc:      SpO2:  98% 99% 100%  Weight: 70.9 kg     Height:        Intake/Output Summary (Last 24 hours) at 03/25/2019 1557 Last data filed at 03/25/2019 1543 Gross per 24 hour  Intake 480 ml  Output 2700 ml  Net -2220 ml   Filed Weights   03/23/19 1734 03/24/19 0431 03/25/19 0425   Weight: 72.7 kg 71.9 kg 70.9 kg    Examination:  General exam: Appears calm and comfortable  Respiratory system: Lung sounds diminished bilaterally.  Scattered crackles.  Normal work of breathing Cardiovascular system: Regular rate, regular rhythm, no murmurs, 2+ pedal edema gastrointestinal system: Abdomen is nondistended, soft and nontender. No organomegaly or masses felt. Normal bowel sounds heard. Central nervous system: Alert and oriented. No focal neurological deficits. Extremities: Symmetric 5 x 5 power.  Lower extremity in wraps bilaterally Skin: No rashes, lesions or ulcers Psychiatry: Judgement and insight appear normal. Mood & affect appropriate.     Data Reviewed: I have personally reviewed following labs and imaging studies  CBC: Recent Labs  Lab 03/23/19 1145  WBC 6.1  HGB 10.2*  HCT 32.2*  MCV 93.1  PLT 299   Basic Metabolic Panel: Recent Labs  Lab 03/23/19 1145 03/24/19 0445 03/25/19 0316  NA 133* 135 136  K 3.6 3.0* 3.5  CL 97* 98 98  CO2 25 28 29   GLUCOSE 141* 110* 103*  BUN 19 18 24*  CREATININE 0.85 0.78 0.89  CALCIUM 8.6* 8.2* 8.4*  MG  --  1.9 2.0   GFR: Estimated Creatinine Clearance: 40.1 mL/min (by C-G formula based on SCr of 0.89 mg/dL). Liver Function Tests: Recent Labs  Lab 03/23/19 1145  AST 30  ALT 17  ALKPHOS 74  BILITOT 1.1  PROT 5.9*  ALBUMIN 3.1*   No results for input(s): LIPASE, AMYLASE in the last 168 hours. No results for input(s): AMMONIA in the last 168 hours. Coagulation Profile: No results for input(s): INR, PROTIME in the last 168 hours. Cardiac Enzymes: No results for input(s): CKTOTAL, CKMB, CKMBINDEX, TROPONINI in the last 168 hours. BNP (last 3 results) No results for input(s): PROBNP in the last 8760 hours. HbA1C: No results for input(s): HGBA1C in the last 72 hours. CBG: No results for input(s): GLUCAP in the last 168 hours. Lipid Profile: No results for input(s): CHOL, HDL, LDLCALC, TRIG, CHOLHDL,  LDLDIRECT in the last 72 hours. Thyroid Function Tests: Recent Labs    03/25/19 0316  TSH 4.118   Anemia Panel: No results for input(s): VITAMINB12, FOLATE, FERRITIN, TIBC, IRON, RETICCTPCT in the last 72 hours. Sepsis Labs: Recent Labs  Lab 03/23/19 1420  PROCALCITON <0.10    Recent Results (from the past 240 hour(s))  Respiratory Panel by RT PCR (Flu A&B, Covid) - Nasopharyngeal Swab     Status: None   Collection Time: 03/23/19 11:54 AM   Specimen: Nasopharyngeal Swab  Result Value Ref Range Status   SARS Coronavirus 2 by RT PCR NEGATIVE NEGATIVE Final    Comment: (NOTE) SARS-CoV-2 target nucleic acids are NOT DETECTED. The SARS-CoV-2 RNA is generally detectable in upper respiratoy specimens during the acute phase of infection. The lowest concentration of SARS-CoV-2 viral copies this assay can detect is 131 copies/mL. A negative result does not preclude SARS-Cov-2 infection and should not be used as the sole basis for treatment or other patient management decisions. A negative result may occur with  improper specimen collection/handling, submission of specimen other than nasopharyngeal swab, presence of viral mutation(s) within the areas targeted by this  assay, and inadequate number of viral copies (<131 copies/mL). A negative result must be combined with clinical observations, patient history, and epidemiological information. The expected result is Negative. Fact Sheet for Patients:  https://www.moore.com/https://www.fda.gov/media/142436/download Fact Sheet for Healthcare Providers:  https://www.young.biz/https://www.fda.gov/media/142435/download This test is not yet ap proved or cleared by the [place] FDA and  has been authorized for detection and/or diagnosis of SARS-CoV-2 by FDA under an Emergency Use Authorization (EUA). This EUA will remain  in effect (meaning this test can be used) for the duration of the COVID-19 declaration under Section 564(b)(1) of the Act, 21 U.S.C. section 360bbb-3(b)(1),  unless the authorization is terminated or revoked sooner.    Influenza A by PCR NEGATIVE NEGATIVE Final   Influenza B by PCR NEGATIVE NEGATIVE Final    Comment: (NOTE) The Xpert Xpress SARS-CoV-2/FLU/RSV assay is intended as an aid in  the diagnosis of influenza from Nasopharyngeal swab specimens and  should not be used as a sole basis for treatment. Nasal washings and  aspirates are unacceptable for Xpert Xpress SARS-CoV-2/FLU/RSV  testing. Fact Sheet for Patients: https://www.moore.com/https://www.fda.gov/media/142436/download Fact Sheet for Healthcare Providers: https://www.young.biz/https://www.fda.gov/media/142435/download This test is not yet approved or cleared by the [place] FDA and  has been authorized for detection and/or diagnosis of SARS-CoV-2 by  FDA under an Emergency Use Authorization (EUA). This EUA will remain  in effect (meaning this test can be used) for the duration of the  Covid-19 declaration under Section 564(b)(1) of the Act, 21  U.S.C. section 360bbb-3(b)(1), unless the authorization is  terminated or revoked. Performed at [facility], [address] [place], [place] [address]   Blood culture (routine x 2)     Status: None (Preliminary result)   Collection Time: 03/23/19  1:53 PM   Specimen: BLOOD  Result Value Ref Range Status   Specimen Description BLOOD BLOOD LEFT ARM  Final   Special Requests   Final    BOTTLES DRAWN AEROBIC AND ANAEROBIC Blood Culture results may not be optimal due to an excessive volume of blood received in culture bottles   Culture   Final    NO GROWTH 2 DAYS Performed at [facility], [address] [place], [place] [address]    Report Status PENDING  Incomplete  Blood culture (routine x 2)     Status: None (Preliminary result)   Collection Time: 03/23/19  1:58 PM   Specimen: BLOOD  Result Value Ref Range Status   Specimen Description BLOOD BLOOD LEFT HAND  Final   Special Requests   Final    BOTTLES DRAWN AEROBIC AND ANAEROBIC Blood  Culture results may not be optimal due to an inadequate volume of blood received in culture bottles   Culture   Final    NO GROWTH 2 DAYS Performed at [facility], [address] [place], [place] [address]    Report Status PENDING  Incomplete  MRSA PCR Screening     Status: None   Collection Time: 03/23/19  5:58 PM   Specimen: Nasopharyngeal  Result Value Ref Range Status   MRSA by PCR NEGATIVE NEGATIVE Final    Comment:        The GeneXpert MRSA Assay (FDA approved for NASAL specimens only), is one component of a comprehensive MRSA colonization surveillance program. It is not intended to diagnose MRSA infection nor to guide or monitor treatment for MRSA infections. Performed at [facility], [address] [place], [place] [address]  Radiology Studies: ECHOCARDIOGRAM COMPLETE  Result Date: 03/24/2019    ECHOCARDIOGRAM REPORT   Patient Name:   JODY AGUINAGA Date of Exam: 03/23/2019 Medical Rec #:  [id number]    Height:       66.0 in Accession #:    [id number]   Weight:       160.3 lb Date of Birth:  12-17-1927    BSA:          1.820 m Patient Age:    63 years     BP:           152/103 mmHg Patient Gender: F            HR:           81 bpm. Exam Location:  [facility] Procedure: 2D Echo, Cardiac Doppler and Color Doppler Indications:     T06.26 Acute Systolic Congestive Heart Failure  History:         Patient has prior history of Echocardiogram examinations, most                  recent 01/11/2017. Risk Factors:Hypertension and Dyslipidemia.                  Thyroid disease.  Sonographer:     Wilford Sports Rodgers-Jones Referring Phys:  [id number] Sidney Ace Diagnosing Phys: Isaias Cowman MD IMPRESSIONS  1. Left ventricular ejection fraction, by estimation, is 25 to 30%. The left ventricle has severely decreased function. The left ventricle has no regional wall motion abnormalities. Left ventricular diastolic parameters were normal.  2. Right ventricular  systolic function is normal. The right ventricular size is normal. There is moderately elevated pulmonary artery systolic pressure.  3. The mitral valve is normal in structure. Moderate to severe mitral valve regurgitation. No evidence of mitral stenosis.  4. Tricuspid valve regurgitation is moderate to severe.  5. The aortic valve is normal in structure. Aortic valve regurgitation is not visualized. No aortic stenosis is present.  6. The inferior vena cava is normal in size with greater than 50% respiratory variability, suggesting right atrial pressure of 3 mmHg. FINDINGS  Left Ventricle: Left ventricular ejection fraction, by estimation, is 25 to 30%. The left ventricle has severely decreased function. The left ventricle has no regional wall motion abnormalities. The left ventricular internal cavity size was normal in size. There is no left ventricular hypertrophy. Left ventricular diastolic parameters were normal. Right Ventricle: The right ventricular size is normal. No increase in right ventricular wall thickness. Right ventricular systolic function is normal. There is moderately elevated pulmonary artery systolic pressure. The tricuspid regurgitant velocity is 3.60 m/s, and with an assumed right atrial pressure of 10 mmHg, the estimated right ventricular systolic pressure is 70.3 mmHg. Left Atrium: Left atrial size was normal in size. Right Atrium: Right atrial size was normal in size. Pericardium: There is no evidence of pericardial effusion. Mitral Valve: The mitral valve is normal in structure. Normal mobility of the mitral valve leaflets. Moderate to severe mitral valve regurgitation. No evidence of mitral valve stenosis. Tricuspid Valve: The tricuspid valve is normal in structure. Tricuspid valve regurgitation is moderate to severe. No evidence of tricuspid stenosis. Aortic Valve: The aortic valve is normal in structure. Aortic valve regurgitation is not visualized. No aortic stenosis is present. Pulmonic  Valve: The pulmonic valve was normal in structure. Pulmonic valve regurgitation is not visualized. No evidence of pulmonic stenosis. Aorta: The aortic root is normal in size and structure. Venous: The inferior  vena cava is normal in size with greater than 50% respiratory variability, suggesting right atrial pressure of 3 mmHg. IAS/Shunts: No atrial level shunt detected by color flow Doppler.  LEFT VENTRICLE PLAX 2D LVIDd:         5.36 cm  Diastology LVIDs:         4.63 cm  LV e' lateral:   3.70 cm/s LV PW:         0.82 cm  LV E/e' lateral: 25.3 LV IVS:        0.81 cm  LV e' medial:    4.03 cm/s LVOT diam:     1.90 cm  LV E/e' medial:  23.2 LV SV:         36 LV SV Index:   20 LVOT Area:     2.84 cm  RIGHT VENTRICLE RV Basal diam:  4.20 cm RV S prime:     16.00 cm/s TAPSE (M-mode): 1.9 cm LEFT ATRIUM             Index       RIGHT ATRIUM           Index LA diam:        3.50 cm 1.92 cm/m  RA Area:     14.00 cm LA Vol (A2C):   68.1 ml 37.41 ml/m RA Volume:   37.90 ml  20.82 ml/m LA Vol (A4C):   57.3 ml 31.48 ml/m LA Biplane Vol: 63.9 ml 35.10 ml/m  AORTIC VALVE LVOT Vmax:   79.10 cm/s LVOT Vmean:  52.700 cm/s LVOT VTI:    0.126 m  AORTA Ao Root diam: 3.40 cm MITRAL VALVE               TRICUSPID VALVE MV Area (PHT): 4.68 cm    TR Peak grad:   51.8 mmHg MV Decel Time: 162 msec    TR Vmax:        360.00 cm/s MV E velocity: 93.60 cm/s MV A velocity: 84.40 cm/s  SHUNTS MV E/A ratio:  1.11        Systemic VTI:  0.13 m                            Systemic Diam: 1.90 cm Marcina Millard MD Electronically signed by Marcina Millard MD Signature Date/Time: 03/24/2019/4:10:37 PM    Final         Scheduled Meds: . apixaban  5 mg Oral BID  . buPROPion  100 mg Oral Daily  . docusate sodium  100 mg Oral BID  . donepezil  5 mg Oral QHS  . feeding supplement (ENSURE ENLIVE)  237 mL Oral BID BM  . ferrous sulfate  325 mg Oral Q breakfast  . furosemide  40 mg Intravenous TID  . levothyroxine  50 mcg Oral BH-q7a    . losartan  50 mg Oral BID  . mouth rinse  15 mL Mouth Rinse BID  . metoprolol succinate  50 mg Oral Daily  . potassium chloride  40 mEq Oral BID  . sertraline  100 mg Oral Daily  . sodium chloride flush  3 mL Intravenous Q12H   Continuous Infusions: . sodium chloride Stopped (03/23/19 1807)     LOS: 2 days    Time spent: 35 minutes    Tresa Moore, MD [facility] Pager [phone]  If 7PM-7AM, please contact night-coverage 03/25/2019, 3:57 PM

## 2019-03-25 NOTE — Progress Notes (Signed)
Patient had a 15 beat run of  vtach per central monitoring. NP B. Jon Billings notified.

## 2019-03-25 NOTE — Progress Notes (Signed)
NP responds r/t 15 beat run of vtach. Orders additional dose of potassium and labs to recheck mag level. Patient resting peacefully at this time. No complains of pain, discomfort or concerns voiced. Will continue to monitor.

## 2019-03-25 NOTE — Care Management Important Message (Signed)
Important Message  Patient Details  Name: Deborah Martinez MRN: [id number] Date of Birth: 01/18/1927   Medicare Important Message Given:  Yes     [name] 03/25/2019, 12:56 PM

## 2019-03-26 ENCOUNTER — Inpatient Hospital Stay: Payer: Medicare Other

## 2019-03-26 LAB — BASIC METABOLIC PANEL
Anion gap: 9 (ref 5–15)
BUN: 20 mg/dL (ref 8–23)
CO2: 29 mmol/L (ref 22–32)
Calcium: 8 mg/dL — ABNORMAL LOW (ref 8.9–10.3)
Chloride: 97 mmol/L — ABNORMAL LOW (ref 98–111)
Creatinine, Ser: 0.71 mg/dL (ref 0.44–1.00)
GFR calc Af Amer: >60 mL/min (ref >60)
GFR calc non Af Amer: >60 mL/min (ref >60)
Glucose, Bld: 125 mg/dL — ABNORMAL HIGH (ref 70–99)
Potassium: 4.7 mmol/L (ref 3.5–5.1)
Sodium: 135 mmol/L (ref 135–145)

## 2019-03-26 MED ORDER — SODIUM CHLORIDE 0.9 % IV SOLN
2.0000 g | INTRAVENOUS | Status: DC
  Administered 2019-03-26 – 2019-03-28 (×3): 2 g via INTRAVENOUS
  Filled 2019-03-26: qty 2
  Filled 2019-03-26: qty 20
  Filled 2019-03-26: qty 2

## 2019-03-26 MED ORDER — SODIUM CHLORIDE 0.9 % IV SOLN
500.0000 mg | INTRAVENOUS | Status: AC
  Administered 2019-03-26 – 2019-03-28 (×3): 500 mg via INTRAVENOUS
  Filled 2019-03-26 (×5): qty 500

## 2019-03-26 MED ORDER — LOPERAMIDE HCL 2 MG PO CAPS
2.0000 mg | ORAL_CAPSULE | ORAL | Status: DC | PRN
  Administered 2019-03-26: 2 mg via ORAL
  Filled 2019-03-26: qty 1

## 2019-03-26 NOTE — Progress Notes (Signed)
PROGRESS NOTE    Deborah Martinez  ZOX:[id number] DOB: 1927/12/09 DOA: 03/23/2019 PCP: [name], MD   Brief Narrative: HPI: Deborah Martinez is a 86 y.o. female with medical history significant of atrial fibrillation, chronic anticoagulation on Eliquis, anxiety, hypertension, hyperlipidemia who is brought to the emergency department from her resident skilled nursing facility for shortness of breath.  Patient has been experiencing approximately 3 to 4 days of worsening shortness of breath.  She is on room air at baseline.  Patient has chronic peripheral edema and currently has lower extremity wraps in place however endorses that her lower extremities have become more edematous recently.  No chest pain.  On my arrival patient only endorses fatigue and only admits to shortness of breath on prompting.  She does not appear in extremis.  No use of accessory muscles.  Normal work of breathing.  Covid was taken and is negative.  3/11: Patient seen and examined.  Patient's husband is at bedside this morning.  Patient feels well.  Remains on 2 L.  Cardiology consult appreciated.  Unclear regarding volume status.  Ins and outs do not seem to correlate with daily weights.  3/12: Patient seen and examined.  No family at bedside this morning.  Patient feels well.  Remains on 2 L oxygen.  Cardiology consult appreciated.  Volume status net negative.  3/13: Patient seen and examined.  Husband at bedside this morning.  Remains on 2 L oxygen.  Repeat chest x-ray demonstrating possible infiltrate.  Volume status 4 L net negative since admission.  Assessment & Plan:   Active Problems:   Acute decompensated heart failure (HCC)  Possible CAP Chest x-ray shows possible infiltrate Possibly contributing to persistent hypoxia despite response to diuresis We will start empiric Rocephin and azithromycin Monitor oxygen, titrate as tolerated  Acute respiratory failure with hypoxia Likely secondary to fluid overloaded  state Possibly secondary to infectious process Currently on 2 L Plan: Diuretics as below Empiric Rocephin and azithromycin Titrate off as tolerated  Fluid overload Acute decompensated systolic heart failure, EF 25% Patient with no known history of heart failure Does have a history of atrial fibrillation Last echocardiogram in 2018, EF 55% with grade 1 diastolic dysfunction 2D echocardiogram: LVEF 25 to 30% Patient received 40 mg IV Lasix in ED Cardiology consult appreciated Plan: Continue Lasix 40 mg 3 times daily Target net negative fluid balance 1-1.5L/day Consider addition of metolazone if patient is not able to effectively diurese Monitor renal function Continue Home Cozaar Uptitrate metoprolol to 50 mg daily Monitor and replace electrolytes, K> 4; mag>2  Atrial fibrillation Chronic anticoagulation on Eliquis Patient is rate controlled at this time Chest pain-free Plan: Continue metoprolol succinate 25 mg daily per home dose Continue Eliquis  Essential hypertension Continue home Cozaar 50 mg twice daily Continue metoprolol succinate 50 mg daily Lasix as above for fluid overload  Anxiety Dementia/cognitive decline Continue home bupropion Continue home Aricept Continue home sertraline  Hypothyroidism Continue home Synthroid  Constipation As needed bowel regimen   DVT prophylaxis: Eliquis Code Status: Full Family Communication: Husband at bedside Disposition Plan: Anticipate return to previous home environment.  Newly diagnosed systolic heart failure with EF of 25%.  Patient continues to be oxygen dependent.  We will continue to diurese aggressively.  Added empiric antibiotics for CAP treatment.  Goal to wean entirely from supplmental oxygen prior to discharge.  PT has recommended SNF placement.  TOC aware and is looking for options.  Consultants:  Cardiology- [facility]as[facility] (signed off  3/12)  Procedures:   2D echo  Antimicrobials:    Rocephin  Azithromycin   Subjective: Seen and examined No acute events No new complaints   Objective: Vitals:   03/26/19 0027 03/26/19 0537 03/26/19 0802 03/26/19 1122  BP: (!) 149/92 (!) 133/91 (!) 142/89 (!) 144/94  Pulse: 77 71 70 72  Resp: 20 17 18 19   Temp: 98.4 F (36.9 C) 98.3 F (36.8 C) 98.1 F (36.7 C) 98.3 F (36.8 C)  TempSrc: Oral Oral Oral   SpO2: 96% 97% 96% 98%  Weight:      Height:        Intake/Output Summary (Last 24 hours) at 03/26/2019 1425 Last data filed at 03/26/2019 8315 Gross per 24 hour  Intake --  Output 1850 ml  Net -1850 ml   Filed Weights   03/23/19 1734 03/24/19 0431 03/25/19 0425  Weight: 72.7 kg 71.9 kg 70.9 kg    Examination:  General exam: Appears calm and comfortable  Respiratory system: Lung sounds diminished bilaterally.  Scattered crackles.  Normal work of breathing Cardiovascular system: Regular rate, regular rhythm, no murmurs, 2+ pedal edema gastrointestinal system: Abdomen is nondistended, soft and nontender. No organomegaly or masses felt. Normal bowel sounds heard. Central nervous system: Alert and oriented. No focal neurological deficits. Extremities: Symmetric 5 x 5 power.  Lower extremity in wraps bilaterally Skin: No rashes, lesions or ulcers Psychiatry: Judgement and insight appear normal. Mood & affect appropriate.     Data Reviewed: I have personally reviewed following labs and imaging studies  CBC: Recent Labs  Lab 03/23/19 1145  WBC 6.1  HGB 10.2*  HCT 32.2*  MCV 93.1  PLT 176   Basic Metabolic Panel: Recent Labs  Lab 03/23/19 1145 03/24/19 0445 03/25/19 0316 03/26/19 0530  NA 133* 135 136 135  K 3.6 3.0* 3.5 4.7  CL 97* 98 98 97*  CO2 25 28 29 29   GLUCOSE 141* 110* 103* 125*  BUN 19 18 24* 20  CREATININE 0.85 0.78 0.89 0.71  CALCIUM 8.6* 8.2* 8.4* 8.0*  MG  --  1.9 2.0  --    GFR: Estimated Creatinine Clearance: 44.6 mL/min (by C-G formula based on SCr of 0.71 mg/dL). Liver  Function Tests: Recent Labs  Lab 03/23/19 1145  AST 30  ALT 17  ALKPHOS 74  BILITOT 1.1  PROT 5.9*  ALBUMIN 3.1*   No results for input(s): LIPASE, AMYLASE in the last 168 hours. No results for input(s): AMMONIA in the last 168 hours. Coagulation Profile: No results for input(s): INR, PROTIME in the last 168 hours. Cardiac Enzymes: No results for input(s): CKTOTAL, CKMB, CKMBINDEX, TROPONINI in the last 168 hours. BNP (last 3 results) No results for input(s): PROBNP in the last 8760 hours. HbA1C: No results for input(s): HGBA1C in the last 72 hours. CBG: No results for input(s): GLUCAP in the last 168 hours. Lipid Profile: No results for input(s): CHOL, HDL, LDLCALC, TRIG, CHOLHDL, LDLDIRECT in the last 72 hours. Thyroid Function Tests: Recent Labs    03/25/19 0316  TSH 4.118   Anemia Panel: No results for input(s): VITAMINB12, FOLATE, FERRITIN, TIBC, IRON, RETICCTPCT in the last 72 hours. Sepsis Labs: Recent Labs  Lab 03/23/19 1420  PROCALCITON <0.10    Recent Results (from the past 240 hour(s))  Respiratory Panel by RT PCR (Flu A&B, Covid) - Nasopharyngeal Swab     Status: None   Collection Time: 03/23/19 11:54 AM   Specimen: Nasopharyngeal Swab  Result Value Ref Range Status  SARS Coronavirus 2 by RT PCR NEGATIVE NEGATIVE Final    Comment: (NOTE) SARS-CoV-2 target nucleic acids are NOT DETECTED. The SARS-CoV-2 RNA is generally detectable in upper respiratoy specimens during the acute phase of infection. The lowest concentration of SARS-CoV-2 viral copies this assay can detect is 131 copies/mL. A negative result does not preclude SARS-Cov-2 infection and should not be used as the sole basis for treatment or other patient management decisions. A negative result may occur with  improper specimen collection/handling, submission of specimen other than nasopharyngeal swab, presence of viral mutation(s) within the areas targeted by this assay, and inadequate  number of viral copies (<131 copies/mL). A negative result must be combined with clinical observations, patient history, and epidemiological information. The expected result is Negative. Fact Sheet for Patients:  https://www.moore.com/ Fact Sheet for Healthcare Providers:  https://www.young.biz/ This test is not yet ap proved or cleared by the [place] FDA and  has been authorized for detection and/or diagnosis of SARS-CoV-2 by FDA under an Emergency Use Authorization (EUA). This EUA will remain  in effect (meaning this test can be used) for the duration of the COVID-19 declaration under Section 564(b)(1) of the Act, 21 U.S.C. section 360bbb-3(b)(1), unless the authorization is terminated or revoked sooner.    Influenza A by PCR NEGATIVE NEGATIVE Final   Influenza B by PCR NEGATIVE NEGATIVE Final    Comment: (NOTE) The Xpert Xpress SARS-CoV-2/FLU/RSV assay is intended as an aid in  the diagnosis of influenza from Nasopharyngeal swab specimens and  should not be used as a sole basis for treatment. Nasal washings and  aspirates are unacceptable for Xpert Xpress SARS-CoV-2/FLU/RSV  testing. Fact Sheet for Patients: https://www.moore.com/ Fact Sheet for Healthcare Providers: https://www.young.biz/ This test is not yet approved or cleared by the [place] FDA and  has been authorized for detection and/or diagnosis of SARS-CoV-2 by  FDA under an Emergency Use Authorization (EUA). This EUA will remain  in effect (meaning this test can be used) for the duration of the  Covid-19 declaration under Section 564(b)(1) of the Act, 21  U.S.C. section 360bbb-3(b)(1), unless the authorization is  terminated or revoked. Performed at [facility], [address] [place], [place] [address]   Blood culture (routine x 2)     Status: None (Preliminary result)   Collection Time: 03/23/19  1:53 PM    Specimen: BLOOD  Result Value Ref Range Status   Specimen Description BLOOD BLOOD LEFT ARM  Final   Special Requests   Final    BOTTLES DRAWN AEROBIC AND ANAEROBIC Blood Culture results may not be optimal due to an excessive volume of blood received in culture bottles   Culture   Final    NO GROWTH 3 DAYS Performed at [facility], [address], [place], [place] [address]    Report Status PENDING  Incomplete  Blood culture (routine x 2)     Status: None (Preliminary result)   Collection Time: 03/23/19  1:58 PM   Specimen: BLOOD  Result Value Ref Range Status   Specimen Description BLOOD BLOOD LEFT HAND  Final   Special Requests   Final    BOTTLES DRAWN AEROBIC AND ANAEROBIC Blood Culture results may not be optimal due to an inadequate volume of blood received in culture bottles   Culture   Final    NO GROWTH 3 DAYS Performed at [facility], [address], [place], [place] [address]    Report Status PENDING  Incomplete  MRSA  PCR Screening     Status: None   Collection Time: 03/23/19  5:58 PM   Specimen: Nasopharyngeal  Result Value Ref Range Status   MRSA by PCR NEGATIVE NEGATIVE Final    Comment:        The GeneXpert MRSA Assay (FDA approved for NASAL specimens only), is one component of a comprehensive MRSA colonization surveillance program. It is not intended to diagnose MRSA infection nor to guide or monitor treatment for MRSA infections. Performed at [facility], [address] [place], [place] [address]          Radiology Studies: [facility] Chest [place] 1 View  Result Date: 03/26/2019 CLINICAL DATA:  Hypoxia per physician order. Pt admitted 03/23/2019 for CHF exacerbation and PNA. Physician notes state - "Patient has been experiencing approximately 3 to 4 days of worsening shortness of breath. She is on room air at baseline. Patient has chronic peripheral edema and currently has lower extremity wraps in place however endorses that her  lower extremities have become more edematous recently." Hx - HTN, former smoker. EXAM: PORTABLE CHEST 1 VIEW COMPARISON:  03/23/2019 FINDINGS: There is increased opacity at the right lung base compared to the prior exam consistent with an increase in pleural fluid. Moderate left pleural effusion is similar to the prior study. Hazy airspace opacities in the right upper lung are unchanged. No pneumothorax. Mild enlargement of the cardiopericardial silhouette. IMPRESSION: 1. Increase right pleural effusions since the prior study now obscuring the hemidiaphragm. Stable moderate left pleural effusion. 2. No change in the hazy right upper lung airspace opacities suggesting multifocal pneumonia. Electronically Signed   By: Amie Portland M.D.   On: 03/26/2019 11:00        Scheduled Meds: . apixaban  5 mg Oral BID  . buPROPion  100 mg Oral Daily  . docusate sodium  100 mg Oral BID  . donepezil  5 mg Oral QHS  . feeding supplement (ENSURE ENLIVE)  237 mL Oral BID BM  . ferrous sulfate  325 mg Oral Q breakfast  . furosemide  40 mg Intravenous TID  . levothyroxine  50 mcg Oral [id number]  . losartan  50 mg Oral BID  . mouth rinse  15 mL Mouth Rinse BID  . metoprolol succinate  50 mg Oral Daily  . potassium chloride  40 mEq Oral BID  . sertraline  100 mg Oral Daily  . sodium chloride flush  3 mL Intravenous Q12H   Continuous Infusions: . sodium chloride Stopped (03/23/19 1807)  . azithromycin 500 mg (03/26/19 1215)  . cefTRIAXone (ROCEPHIN)  IV 2 g (03/26/19 1121)     LOS: 3 days    Time spent: 35 minutes    Tresa Moore, MD [facility] Pager [phone]  If 7PM-7AM, please contact night-coverage 03/26/2019, 2:25 PM

## 2019-03-26 NOTE — Plan of Care (Signed)
  Problem: Education: Goal: Ability to demonstrate management of disease process will improve Outcome: Progressing Goal: Ability to verbalize understanding of medication therapies will improve Outcome: Progressing   Problem: Activity: Goal: Capacity to carry out activities will improve Outcome: Progressing   Problem: Cardiac: Goal: Ability to achieve and maintain adequate cardiopulmonary perfusion will improve Outcome: Progressing   

## 2019-03-27 LAB — BASIC METABOLIC PANEL
Anion gap: 10 (ref 5–15)
BUN: 22 mg/dL (ref 8–23)
CO2: 29 mmol/L (ref 22–32)
Calcium: 8.1 mg/dL — ABNORMAL LOW (ref 8.9–10.3)
Chloride: 98 mmol/L (ref 98–111)
Creatinine, Ser: 0.91 mg/dL (ref 0.44–1.00)
GFR calc Af Amer: >60 mL/min (ref >60)
GFR calc non Af Amer: 56 mL/min — ABNORMAL LOW (ref >60)
Glucose, Bld: 120 mg/dL — ABNORMAL HIGH (ref 70–99)
Potassium: 4.5 mmol/L (ref 3.5–5.1)
Sodium: 137 mmol/L (ref 135–145)

## 2019-03-27 LAB — CBC
HCT: 32.1 % — ABNORMAL LOW (ref 36.0–46.0)
Hemoglobin: 9.9 g/dL — ABNORMAL LOW (ref 12.0–15.0)
MCH: 29 pg (ref 26.0–34.0)
MCHC: 30.8 g/dL (ref 30.0–36.0)
MCV: 94.1 fL (ref 80.0–100.0)
Platelets: 283 10*3/uL (ref 150–400)
RBC: 3.41 MIL/uL — ABNORMAL LOW (ref 3.87–5.11)
RDW: 15 % (ref 11.5–15.5)
WBC: 8.3 10*3/uL (ref 4.0–10.5)
nRBC: 0 % (ref 0.0–0.2)

## 2019-03-27 MED ORDER — FUROSEMIDE 10 MG/ML IJ SOLN
40.0000 mg | Freq: Two times a day (BID) | INTRAMUSCULAR | Status: DC
  Administered 2019-03-27 – 2019-03-28 (×2): 40 mg via INTRAVENOUS
  Filled 2019-03-27 (×2): qty 4

## 2019-03-27 MED ORDER — HYDRALAZINE HCL 20 MG/ML IJ SOLN
10.0000 mg | Freq: Four times a day (QID) | INTRAMUSCULAR | Status: DC | PRN
  Administered 2019-03-27: 10 mg via INTRAVENOUS
  Filled 2019-03-27: qty 1

## 2019-03-27 NOTE — Progress Notes (Signed)
PROGRESS NOTE    Deborah Martinez  ZOX:[id number] DOB: [birth year] DOA: 03/23/2019 PCP: [name], MD   Brief Narrative: HPI: Deborah Martinez is a 86 y.o. female with medical history significant of atrial fibrillation, chronic anticoagulation on Eliquis, anxiety, hypertension, hyperlipidemia who is brought to the emergency department from her resident skilled nursing facility for shortness of breath.  Patient has been experiencing approximately 3 to 4 days of worsening shortness of breath.  She is on room air at baseline.  Patient has chronic peripheral edema and currently has lower extremity wraps in place however endorses that her lower extremities have become more edematous recently.  No chest pain.  On my arrival patient only endorses fatigue and only admits to shortness of breath on prompting.  She does not appear in extremis.  No use of accessory muscles.  Normal work of breathing.  Covid was taken and is negative.  3/11: Patient seen and examined.  Patient's husband is at bedside this morning.  Patient feels well.  Remains on 2 L.  Cardiology consult appreciated.  Unclear regarding volume status.  Ins and outs do not seem to correlate with daily weights.  3/12: Patient seen and examined.  No family at bedside this morning.  Patient feels well.  Remains on 2 L oxygen.  Cardiology consult appreciated.  Volume status net negative.  3/13: Patient seen and examined.  Husband at bedside this morning.  Remains on 2 L oxygen.  Repeat chest x-ray demonstrating possible infiltrate.  Volume status 4 L net negative since admission.  3/14: Patient seen and examined.  No family members at bedside this morning.  Patient was sleepy this morning rather difficult to arouse.  Remains on 2 L supplemental oxygen.  Volume status 6 liters net negative since admission.  Patient down 5 kg in weight since admission.  Assessment & Plan:   Active Problems:   Acute decompensated heart failure (HCC)  Possible  CAP Chest x-ray shows possible infiltrate Possibly contributing to persistent hypoxia despite response to diuresis Continue empiric Rocephin and azithromycin Titrate oxygen as tolerated  Acute respiratory failure with hypoxia Likely secondary to fluid overloaded state Possibly secondary to infectious process Currently on 2 L Plan: Diuretics as below Empiric Rocephin and azithromycin Titrate off as tolerated  Fluid overload Acute decompensated systolic heart failure, EF 25% Patient with no known history of heart failure Does have a history of atrial fibrillation Last echocardiogram in 2018, EF 55% with grade 1 diastolic dysfunction 2D echocardiogram: LVEF 25 to 30% Patient received 40 mg IV Lasix in ED Cardiology consult appreciated Plan: Lasix 40 mg twice daily, continue to titrate down Target net negative fluid balance 1-1.5L/day If patient is weaned from supplemental oxygen entirely will transition to p.o. Lasix tomorrow Monitor renal function Continue Home Cozaar metoprolol to 50 mg daily Monitor and replace electrolytes, K> 4; mag>2  Atrial fibrillation Chronic anticoagulation on Eliquis Patient is rate controlled at this time Chest pain-free Plan: Continue metoprolol succinate 25 mg daily per home dose Continue Eliquis  Essential hypertension Continue home Cozaar 50 mg twice daily Continue metoprolol succinate 50 mg daily Lasix as above for fluid overload  Anxiety Dementia/cognitive decline Continue home bupropion Continue home Aricept Continue home sertraline  Hypothyroidism Continue home Synthroid  Constipation As needed bowel regimen   DVT prophylaxis: Eliquis Code Status: Full Family Communication: [name] [phone] via phone on 03/27/2019 Disposition Plan: Anticipate return to previous home environment.  Newly diagnosed systolic heart failure with EF of  25%.  Patient on 2 L.  We will continue to diurese.  Empiric treatment for  community-acquired pneumonia.  Patient titrated to room air anticipate transition to p.o. diuretic regimen and preparation for discharge back to assisted living.  Consultants:  Cardiology- Paraschos (signed off 3/12)  Procedures:   2D echo  Antimicrobials:   Rocephin  Azithromycin   Subjective: Seen and examined No acute events No new complaints   Objective: Vitals:   03/27/19 0500 03/27/19 0851 03/27/19 1029 03/27/19 1211  BP:  (!) 165/110 (!) 154/116 112/67  Pulse:  74 82 80  Resp:  16  16  Temp:  97.6 F (36.4 C)  98.1 F (36.7 C)  TempSrc:    Oral  SpO2:  98%  91%  Weight: 67.2 kg     Height:        Intake/Output Summary (Last 24 hours) at 03/27/2019 1430 Last data filed at 03/27/2019 1300 Gross per 24 hour  Intake --  Output 1775 ml  Net -1775 ml   Filed Weights   03/24/19 0431 03/25/19 0425 03/27/19 0500  Weight: 71.9 kg 70.9 kg 67.2 kg    Examination:  General exam: Appears calm and comfortable  Respiratory system: Lung sounds diminished bilaterally.  Scattered crackles.  Normal work of breathing Cardiovascular system: Regular rate, regular rhythm, no murmurs, 2+ pedal edema gastrointestinal system: Abdomen is nondistended, soft and nontender. No organomegaly or masses felt. Normal bowel sounds heard. Central nervous system: Alert and oriented. No focal neurological deficits. Extremities: Symmetric 5 x 5 power.  Lower extremity in wraps bilaterally Skin: No rashes, lesions or ulcers Psychiatry: Judgement and insight appear normal. Mood & affect appropriate.     Data Reviewed: I have personally reviewed following labs and imaging studies  CBC: Recent Labs  Lab 03/23/19 1145 03/27/19 0644  WBC 6.1 8.3  HGB 10.2* 9.9*  HCT 32.2* 32.1*  MCV 93.1 94.1  PLT 299 283   Basic Metabolic Panel: Recent Labs  Lab 03/23/19 1145 03/24/19 0445 03/25/19 0316 03/26/19 0530 03/27/19 0644  NA 133* 135 136 135 137  K 3.6 3.0* 3.5 4.7 4.5  CL 97* 98  98 97* 98  CO2 25 28 29 29 29   GLUCOSE 141* 110* 103* 125* 120*  BUN 19 18 24* 20 22  CREATININE 0.85 0.78 0.89 0.71 0.91  CALCIUM 8.6* 8.2* 8.4* 8.0* 8.1*  MG  --  1.9 2.0  --   --    GFR: Estimated Creatinine Clearance: 39.2 mL/min (by C-G formula based on SCr of 0.91 mg/dL). Liver Function Tests: Recent Labs  Lab 03/23/19 1145  AST 30  ALT 17  ALKPHOS 74  BILITOT 1.1  PROT 5.9*  ALBUMIN 3.1*   No results for input(s): LIPASE, AMYLASE in the last 168 hours. No results for input(s): AMMONIA in the last 168 hours. Coagulation Profile: No results for input(s): INR, PROTIME in the last 168 hours. Cardiac Enzymes: No results for input(s): CKTOTAL, CKMB, CKMBINDEX, TROPONINI in the last 168 hours. BNP (last 3 results) No results for input(s): PROBNP in the last 8760 hours. HbA1C: No results for input(s): HGBA1C in the last 72 hours. CBG: No results for input(s): GLUCAP in the last 168 hours. Lipid Profile: No results for input(s): CHOL, HDL, LDLCALC, TRIG, CHOLHDL, LDLDIRECT in the last 72 hours. Thyroid Function Tests: Recent Labs    03/25/19 0316  TSH 4.118   Anemia Panel: No results for input(s): VITAMINB12, FOLATE, FERRITIN, TIBC, IRON, RETICCTPCT in the last 72  hours. Sepsis Labs: Recent Labs  Lab 03/23/19 1420  PROCALCITON <0.10    Recent Results (from the past 240 hour(s))  Respiratory Panel by RT PCR (Flu A&B, Covid) - Nasopharyngeal Swab     Status: None   Collection Time: 03/23/19 11:54 AM   Specimen: Nasopharyngeal Swab  Result Value Ref Range Status   SARS Coronavirus 2 by RT PCR NEGATIVE NEGATIVE Final    Comment: (NOTE) SARS-CoV-2 target nucleic acids are NOT DETECTED. The SARS-CoV-2 RNA is generally detectable in upper respiratoy specimens during the acute phase of infection. The lowest concentration of SARS-CoV-2 viral copies this assay can detect is 131 copies/mL. A negative result does not preclude SARS-Cov-2 infection and should not be used  as the sole basis for treatment or other patient management decisions. A negative result may occur with  improper specimen collection/handling, submission of specimen other than nasopharyngeal swab, presence of viral mutation(s) within the areas targeted by this assay, and inadequate number of viral copies (<131 copies/mL). A negative result must be combined with clinical observations, patient history, and epidemiological information. The expected result is Negative. Fact Sheet for Patients:  PinkCheek.be Fact Sheet for Healthcare Providers:  GravelBags.it This test is not yet ap proved or cleared by the [place] FDA and  has been authorized for detection and/or diagnosis of SARS-CoV-2 by FDA under an Emergency Use Authorization (EUA). This EUA will remain  in effect (meaning this test can be used) for the duration of the COVID-19 declaration under Section 564(b)(1) of the Act, 21 U.S.C. section 360bbb-3(b)(1), unless the authorization is terminated or revoked sooner.    Influenza A by PCR NEGATIVE NEGATIVE Final   Influenza B by PCR NEGATIVE NEGATIVE Final    Comment: (NOTE) The Xpert Xpress SARS-CoV-2/FLU/RSV assay is intended as an aid in  the diagnosis of influenza from Nasopharyngeal swab specimens and  should not be used as a sole basis for treatment. Nasal washings and  aspirates are unacceptable for Xpert Xpress SARS-CoV-2/FLU/RSV  testing. Fact Sheet for Patients: PinkCheek.be Fact Sheet for Healthcare Providers: GravelBags.it This test is not yet approved or cleared by the [place] FDA and  has been authorized for detection and/or diagnosis of SARS-CoV-2 by  FDA under an Emergency Use Authorization (EUA). This EUA will remain  in effect (meaning this test can be used) for the duration of the  Covid-19 declaration under Section 564(b)(1) of the Act,  21  U.S.C. section 360bbb-3(b)(1), unless the authorization is  terminated or revoked. Performed at [facility], [address] [place], [place] [address]   Blood culture (routine x 2)     Status: None (Preliminary result)   Collection Time: 03/23/19  1:53 PM   Specimen: BLOOD  Result Value Ref Range Status   Specimen Description BLOOD BLOOD LEFT ARM  Final   Special Requests   Final    BOTTLES DRAWN AEROBIC AND ANAEROBIC Blood Culture results may not be optimal due to an excessive volume of blood received in culture bottles   Culture   Final    NO GROWTH 4 DAYS Performed at [facility], [address], [place], [place] [address]    Report Status PENDING  Incomplete  Blood culture (routine x 2)     Status: None (Preliminary result)   Collection Time: 03/23/19  1:58 PM   Specimen: BLOOD  Result Value Ref Range Status   Specimen Description BLOOD BLOOD LEFT HAND  Final   Special Requests   Final  BOTTLES DRAWN AEROBIC AND ANAEROBIC Blood Culture results may not be optimal due to an inadequate volume of blood received in culture bottles   Culture   Final    NO GROWTH 4 DAYS Performed at [facility] [address] [place], [place] [address]    Report Status PENDING  Incomplete  MRSA PCR Screening     Status: None   Collection Time: 03/23/19  5:58 PM   Specimen: Nasopharyngeal  Result Value Ref Range Status   MRSA by PCR NEGATIVE NEGATIVE Final    Comment:        The GeneXpert MRSA Assay (FDA approved for NASAL specimens only), is one component of a comprehensive MRSA colonization surveillance program. It is not intended to diagnose MRSA infection nor to guide or monitor treatment for MRSA infections. Performed at [facility], [address] [place], [place] [address]          Radiology Studies: [place] [facility] [place] 1 View  Result Date: 03/26/2019 CLINICAL DATA:  Hypoxia per physician order. Pt admitted 03/23/2019 for CHF  exacerbation and PNA. Physician notes state - "Patient has been experiencing approximately 3 to 4 days of worsening shortness of breath. She is on room air at baseline. Patient has chronic peripheral edema and currently has lower extremity wraps in place however endorses that her lower extremities have become more edematous recently." Hx - HTN, former smoker. EXAM: PORTABLE CHEST 1 VIEW COMPARISON:  03/23/2019 FINDINGS: There is increased opacity at the right lung base compared to the prior exam consistent with an increase in pleural fluid. Moderate left pleural effusion is similar to the prior study. Hazy airspace opacities in the right upper lung are unchanged. No pneumothorax. Mild enlargement of the cardiopericardial silhouette. IMPRESSION: 1. Increase right pleural effusions since the prior study now obscuring the hemidiaphragm. Stable moderate left pleural effusion. 2. No change in the hazy right upper lung airspace opacities suggesting multifocal pneumonia. Electronically Signed   By: Amie Portland M.D.   On: 03/26/2019 11:00        Scheduled Meds: . apixaban  5 mg Oral BID  . buPROPion  100 mg Oral Daily  . docusate sodium  100 mg Oral BID  . donepezil  5 mg Oral QHS  . feeding supplement (ENSURE ENLIVE)  237 mL Oral BID BM  . ferrous sulfate  325 mg Oral Q breakfast  . furosemide  40 mg Intravenous BID  . levothyroxine  50 mcg Oral BH-q7a  . losartan  50 mg Oral BID  . mouth rinse  15 mL Mouth Rinse BID  . metoprolol succinate  50 mg Oral Daily  . potassium chloride  40 mEq Oral BID  . sertraline  100 mg Oral Daily  . sodium chloride flush  3 mL Intravenous Q12H   Continuous Infusions: . sodium chloride Stopped (03/23/19 1807)  . azithromycin 500 mg (03/27/19 1322)  . cefTRIAXone (ROCEPHIN)  IV 2 g (03/27/19 0911)     LOS: 4 days    Time spent: 35 minutes    Tresa Moore, MD [facility] Pager [phone]  If 7PM-7AM, please contact  night-coverage 03/27/2019, 2:30 PM

## 2019-03-28 LAB — CULTURE, BLOOD (ROUTINE X 2)
Culture: NO GROWTH
Culture: NO GROWTH

## 2019-03-28 LAB — BASIC METABOLIC PANEL
Anion gap: 12 (ref 5–15)
BUN: 28 mg/dL — ABNORMAL HIGH (ref 8–23)
CO2: 28 mmol/L (ref 22–32)
Calcium: 8.7 mg/dL — ABNORMAL LOW (ref 8.9–10.3)
Chloride: 99 mmol/L (ref 98–111)
Creatinine, Ser: 0.78 mg/dL (ref 0.44–1.00)
GFR calc Af Amer: >60 mL/min (ref >60)
GFR calc non Af Amer: >60 mL/min (ref >60)
Glucose, Bld: 131 mg/dL — ABNORMAL HIGH (ref 70–99)
Potassium: 4.9 mmol/L (ref 3.5–5.1)
Sodium: 139 mmol/L (ref 135–145)

## 2019-03-28 MED ORDER — FUROSEMIDE 40 MG PO TABS: 40.0000 mg | ORAL_TABLET | Freq: Every day | ORAL | 0 refills | Status: DC

## 2019-03-28 MED ORDER — FUROSEMIDE 40 MG PO TABS: 40.0000 mg | ORAL_TABLET | Freq: Every day | ORAL | Status: DC

## 2019-03-28 MED ORDER — METOPROLOL SUCCINATE ER 50 MG PO TB24: 50.0000 mg | ORAL_TABLET | Freq: Every day | ORAL | 0 refills | Status: AC

## 2019-03-28 MED ORDER — CEFDINIR 300 MG PO CAPS: 300.0000 mg | ORAL_CAPSULE | Freq: Two times a day (BID) | ORAL | 0 refills | Status: AC

## 2019-03-28 MED ORDER — CEFDINIR 300 MG PO CAPS: 300.0000 mg | ORAL_CAPSULE | Freq: Two times a day (BID) | ORAL | Status: DC

## 2019-03-28 NOTE — TOC Transition Note (Signed)
Transition of Care [facility]) - CM/SW Discharge Note   Patient Details  Name: JERUSALEN MATEJA MRN: [id number] Date of Birth: 05/30/1927  Transition of Care [facility]) CM/SW Contact:  Shawn Route, RN Phone Number: 03/28/2019, 3:39 PM   Clinical Narrative:     Spoke with Son Rocky Link and Evette CMT at [facility].  They are in agreement for patient to be discharged today.  FL2 and Discharge summary faxed to [facility] and copy placed in discharge packet.EMS called and packet placed on chart.  Charge nurse notified at 1510    Final next level of care: Assisted Living(with home health) Barriers to Discharge: Barriers Resolved   Patient Goals and CMS Choice     Choice offered to / list presented to : Adult Children  Discharge Placement                Patient to be transferred to facility by: Tennille EMs Name of family member notified: Alanda Slim Patient and family notified of of transfer: 03/28/19  Discharge Plan and Services In-house Referral: Clinical Social Work Discharge Planning Services: CM Consult                        HH Agency: [facility] Date HH Agency Contacted: 03/28/19 Time HH Agency Contacted: 1430 Representative spoke with at [facility] Agency: Evette  Social Determinants of Health (SDOH) Interventions     Readmission Risk Interventions No flowsheet data found.

## 2019-03-28 NOTE — TOC Progression Note (Signed)
Transition of Care [facility]) - Progression Note    Patient Details  Name: AUDREENA SACHDEVA MRN: [id number] Date of Birth: [birth year]  [facility]) CM/SW Contact  Shawn Route, RN Phone Number: 03/28/2019, 2:08 PM  Clinical Narrative:      Spoke with Dionne Milo, patient is a resident at this facility.  Admissions Coordinator is on vacation and the facility is trying to figure out how they can accept patient back to facility.  Spoke with Med Tech and she took my name and number.  She is calling her supervisor and states she will call me back.    Expected Discharge Plan: Assisted Living(with home health) Barriers to Discharge: Continued Medical Work up  Expected Discharge Plan and Services Expected Discharge Plan: Assisted Living(with home health) In-house Referral: Clinical Social Work Discharge Planning Services: CM Consult   Living arrangements for the past 2 months: Assisted Living Facility                                       Social Determinants of Health (SDOH) Interventions    Readmission Risk Interventions No flowsheet data found.

## 2019-03-28 NOTE — TOC Progression Note (Signed)
Transition of Care [facility]) - Progression Note    Patient Details  Name: Deborah Martinez MRN: [id number] Date of Birth: [birth year]  Transition of Care [facility]) CM/SW Contact  Shawn Route, RN Phone Number: 03/28/2019, 2:27 PM  Clinical Narrative:     Son returned call and expressed his wishes to have his mother transferred back to ALF as soon as possible.  He feels like she will do better in her normal environment.  He is open to PT/OT hh services at discharge.      Expected Discharge Plan: Assisted Living(with home health) Barriers to Discharge: Continued Medical Work up  Expected Discharge Plan and Services Expected Discharge Plan: Assisted Living(with home health) In-house Referral: Clinical Social Work Discharge Planning Services: CM Consult   Living arrangements for the past 2 months: Assisted Living Facility                                       Social Determinants of Health (SDOH) Interventions    Readmission Risk Interventions No flowsheet data found.

## 2019-03-28 NOTE — Progress Notes (Signed)
OT Cancellation Note  Patient Details Name: Deborah Martinez MRN: [id number] DOB: July 08, 1927   Cancelled Treatment:    Reason Eval/Treat Not Completed: Fatigue/lethargy limiting ability to participate Attempted to see patient for OT session but she was unable to open eyes even with cues even though she was able to respond to simple questions.  She continues to be on 2L of O2. Will attempt again tomorrow.  Susanne Borders, OTR/L, NTMTC ascom [phone] 03/28/19, 11:45 AM

## 2019-03-28 NOTE — Care Management Important Message (Signed)
Important Message  Patient Details  Name: Deborah Martinez MRN: [id number] Date of Birth: Oct 17, 1927   Medicare Important Message Given:  Yes     [name] 03/28/2019, 12:34 PM

## 2019-03-28 NOTE — [place] FL2 (Signed)
[place] MEDICAID FL2 LEVEL OF CARE SCREENING TOOL     IDENTIFICATION  Patient Name: Deborah Martinez Birthdate: 1927/07/20 Sex: female Admission Date (Current Location): 03/23/2019  [place] and [place] Number:      Facility and Address:  [facility], [address] [place], [place] [address]      Provider Number: [id number]  Attending Physician Name and Address:  Tresa Moore, MD  Relative Name and Phone Number:  Jannette Fogo  (son)  [phone]    Current Level of Care: Other (Comment)(Assisted Living Facility, [place]) Recommended Level of Care: Assisted Living Facility Prior Approval Number:    Date Approved/Denied:   PASRR Number: [id number]  Discharge Plan: Other (Comment)(Assisted Living Facility)    Current Diagnoses: Patient Active Problem List   Diagnosis Date Noted  . Acute decompensated heart failure (HCC) 03/23/2019  . Hypothyroid 11/20/2018  . Protein calorie malnutrition (HCC) 11/20/2018  . AF (paroxysmal atrial fibrillation) (HCC) 11/20/2018  . Femur fracture, left (HCC) 11/19/2018  . UTI (urinary tract infection) 02/01/2017  . Chest pain 01/10/2017  . Dizziness 01/10/2017  . Ataxia 01/10/2017  . Cystocele, midline 07/27/2014  . Menopause 07/27/2014  . Vaginal atrophy 07/27/2014  . Incomplete bladder emptying 07/27/2014  . Anxiety and depression 07/27/2014  . Constipation 07/27/2014  . Hypertension 07/27/2014  . Hypercholesterolemia 07/27/2014  . Hemorrhoids 07/27/2014  . Rectocele 07/27/2014  . Vaginal enterocele 07/27/2014    Orientation RESPIRATION BLADDER Height & Weight     Self  Normal Incontinent Weight: 66.9 kg Height:  5\' 6"  (167.6 cm)  BEHAVIORAL SYMPTOMS/MOOD NEUROLOGICAL BOWEL NUTRITION STATUS      Incontinent Diet  AMBULATORY STATUS COMMUNICATION OF NEEDS Skin   Supervision Verbally Other (Comment)                       Personal Care Assistance Level of Assistance  Bathing,  Dressing Bathing Assistance: Limited assistance   Dressing Assistance: Limited assistance     Functional Limitations Info             SPECIAL CARE FACTORS FREQUENCY                       Contractures Contractures Info: Not present    Additional Factors Info  Code Status Code Status Info: Full             Current Medications (03/28/2019):  This is the current hospital active medication list Current Facility-Administered Medications  Medication Dose Route Frequency Provider Last Rate Last Admin  . 0.9 %  sodium chloride infusion  250 mL Intravenous PRN 03/30/2019, MD   Stopped at 03/23/19 1807  . acetaminophen (TYLENOL) tablet 650 mg  650 mg Oral Q6H PRN 05/23/19 B, MD   650 mg at 03/27/19 1108  . apixaban (ELIQUIS) tablet 5 mg  5 mg Oral BID 03/29/19 B, MD   5 mg at 03/28/19 0839  . buPROPion (WELLBUTRIN SR) 12 hr tablet 100 mg  100 mg Oral Daily 03/30/19 B, MD   100 mg at 03/28/19 0839  . calcium carbonate (TUMS - dosed in mg elemental calcium) chewable tablet 400 mg of elemental calcium  2 tablet Oral BID PRN 03/30/19 B, MD   400 mg of elemental calcium at 03/27/19 1108  . [START ON 03/29/2019] cefdinir (OMNICEF) capsule 300 mg  300 mg Oral Q12H Sreenath, Sudheer B, MD      . docusate  sodium (COLACE) capsule 100 mg  100 mg Oral BID Lolita Patella B, MD   100 mg at 03/28/19 0839  . donepezil (ARICEPT) tablet 5 mg  5 mg Oral QHS Lolita Patella B, MD   5 mg at 03/27/19 2044  . feeding supplement (ENSURE ENLIVE) (ENSURE ENLIVE) liquid 237 mL  237 mL Oral BID BM Sreenath, Sudheer B, MD   237 mL at 03/28/19 0840  . ferrous sulfate tablet 325 mg  325 mg Oral Q breakfast Lolita Patella B, MD   325 mg at 03/28/19 0839  . furosemide (LASIX) tablet 40 mg  40 mg Oral Daily Sreenath, Sudheer B, MD      . hydrALAZINE (APRESOLINE) injection 10 mg  10 mg Intravenous Q6H PRN Lolita Patella B, MD   10 mg at 03/27/19 1108  .  HYDROcodone-acetaminophen (NORCO/VICODIN) 5-325 MG per tablet 1 tablet  1 tablet Oral Q4H PRN Lolita Patella B, MD   1 tablet at 03/27/19 2046  . levothyroxine (SYNTHROID) tablet 50 mcg  50 mcg Oral Maretta Bees, Sudheer B, MD   50 mcg at 03/28/19 0458  . loperamide (IMODIUM) capsule 2 mg  2 mg Oral PRN Lolita Patella B, MD   2 mg at 03/26/19 1211  . losartan (COZAAR) tablet 50 mg  50 mg Oral BID Lolita Patella B, MD   50 mg at 03/28/19 0839  . meclizine (ANTIVERT) tablet 12.5 mg  12.5 mg Oral TID PRN Lolita Patella B, MD   12.5 mg at 03/23/19 2130  . MEDLINE mouth rinse  15 mL Mouth Rinse BID Lolita Patella B, MD   15 mL at 03/28/19 0840  . metoprolol succinate (TOPROL-XL) 24 hr tablet 50 mg  50 mg Oral Daily Paraschos, Alexander, MD   50 mg at 03/28/19 0839  . ondansetron (ZOFRAN) injection 4 mg  4 mg Intravenous Q6H PRN Lolita Patella B, MD   4 mg at 03/26/19 0032  . senna-docusate (Senokot-S) tablet 1 tablet  1 tablet Oral QHS PRN Sreenath, Sudheer B, MD      . sertraline (ZOLOFT) tablet 100 mg  100 mg Oral Daily Lolita Patella B, MD   100 mg at 03/28/19 0839  . sodium chloride flush (NS) 0.9 % injection 3 mL  3 mL Intravenous Q12H Lolita Patella B, MD   3 mL at 03/28/19 0840  . sodium chloride flush (NS) 0.9 % injection 3 mL  3 mL Intravenous PRN Lolita Patella B, MD         Discharge Medications: STOP taking these medications   hydrochlorothiazide 12.5 MG capsule Commonly known as: MICROZIDE     TAKE these medications   acetaminophen 325 MG tablet Commonly known as: TYLENOL Take 2 tablets (650 mg total) by mouth every 6 (six) hours as needed for mild pain or fever.   alendronate 70 MG tablet Commonly known as: FOSAMAX Take 70 mg by mouth once a week. Take with a full glass of water on an empty stomach.   buPROPion 100 MG 12 hr tablet Commonly known as: WELLBUTRIN SR Take 100 mg by mouth daily.   calcium carbonate 500 MG chewable  tablet Commonly known as: TUMS - dosed in mg elemental calcium Chew 2 tablets (400 mg of elemental calcium total) by mouth 2 (two) times daily as needed for indigestion or heartburn. What changed: when to take this   cefdinir 300 MG capsule Commonly known as: OMNICEF Take 1 capsule (300 mg total) by mouth every 12 (twelve) hours for  2 days. Start taking on: March 29, 2019   docusate sodium 100 MG capsule Commonly known as: COLACE Take 1 capsule (100 mg total) by mouth 2 (two) times daily.   donepezil 5 MG tablet Commonly known as: ARICEPT Take 5 mg by mouth at bedtime.   Eliquis 5 MG Tabs tablet Generic drug: apixaban Take 5 mg by mouth 2 (two) times daily.   feeding supplement (ENSURE ENLIVE) Liqd Take 237 mLs by mouth 2 (two) times daily between meals.   feeding supplement (PRO-STAT SUGAR FREE 64) Liqd Take 30 mLs by mouth daily.   ferrous sulfate 325 (65 FE) MG tablet Take 325 mg by mouth daily with breakfast.   furosemide 40 MG tablet Commonly known as: LASIX Take 1 tablet (40 mg total) by mouth daily. Start taking on: March 29, 2019   HYDROcodone-acetaminophen 5-325 MG tablet Commonly known as: NORCO/VICODIN Take 1 tablet by mouth every 4 (four) hours as needed for moderate pain.   levothyroxine 50 MCG tablet Commonly known as: SYNTHROID Take 50 mcg by mouth every morning.   losartan 50 MG tablet Commonly known as: COZAAR Take 50 mg by mouth 2 (two) times daily.   meclizine 12.5 MG tablet Commonly known as: ANTIVERT Take 1 tablet (12.5 mg total) by mouth 3 (three) times daily as needed for dizziness.   metoprolol succinate 50 MG 24 hr tablet Commonly known as: TOPROL-XL Take 1 tablet (50 mg total) by mouth daily. What changed:   medication strength  how much to take   multivitamin-iron-minerals-folic acid chewable tablet Chew 1 tablet by mouth daily.   ondansetron 4 MG tablet Commonly known as: ZOFRAN Take 1 tablet (4 mg total) by  mouth every 6 (six) hours as needed for nausea.   phenylephrine-shark liver oil-mineral oil-petrolatum 0.25-3-14-71.9 % rectal ointment Commonly known as: PREPARATION H Place 1 application rectally 4 (four) times daily as needed for hemorrhoids.   senna-docusate 8.6-50 MG tablet Commonly known as: Senokot-S Take 1 tablet by mouth at bedtime as needed for moderate constipation.   sertraline 100 MG tablet Commonly known as: ZOLOFT Take 100 mg by mouth daily.      Relevant Imaging Results:  Relevant Lab Results:   Additional Information    Victorino Dike, RN

## 2019-03-28 NOTE — Progress Notes (Signed)
Physical Therapy Treatment Patient Details Name: Deborah Martinez MRN: [id number] DOB: 24-May-1927 Today's Date: 03/28/2019    History of Present Illness Pt is a 86 year old female admitted for acute decompensated heart failure in context of increased shortness of breath.  Pt's PMH includes dementia, anxiety, hypertension, hyperlipidemia, chronic peripheral edema, atrial fibrillation, and chronic anticoagulation.    PT Comments    Pt generally disinterested in participating in therapy session.  Eyes closed throughout session but awake.  She refuses all OOB encouragement multiple times and approaches during session.  She agrees to supine ex but puts little effort into assisting.     Follow Up Recommendations  SNF     Equipment Recommendations  None recommended by PT    Recommendations for Other Services       Precautions / Restrictions Precautions Precautions: Fall Precaution Comments: monitor HR and SpO2 Restrictions Weight Bearing Restrictions: No    Mobility  Bed Mobility                  Transfers                    Ambulation/Gait                 Stairs             Wheelchair Mobility    Modified Rankin (Stroke Patients Only)       Balance                                            Cognition Arousal/Alertness: Awake/alert Behavior During Therapy: WFL for tasks assessed/performed Overall Cognitive Status: History of cognitive impairments - at baseline                                 General Comments: awake but keeps eyes closed throughout session.  asked if light is bothering her and she denies.      Exercises Other Exercises Other Exercises: BLE AAROM ankle pumps, ab/add, slr and heel slids x 10    General Comments        Pertinent Vitals/Pain Pain Assessment: No/denies pain    Home Living                      Prior Function            PT Goals (current goals can now be  found in the care plan section) Progress towards PT goals: Not progressing toward goals - comment    Frequency    Min 2X/week      PT Plan Current plan remains appropriate    Co-evaluation              AM-PAC PT "6 Clicks" Mobility   Outcome Measure  Help needed turning from your back to your side while in a flat bed without using bedrails?: A Little Help needed moving from lying on your back to sitting on the side of a flat bed without using bedrails?: A Little Help needed moving to and from a bed to a chair (including a wheelchair)?: A Lot Help needed standing up from a chair using your arms (e.g., wheelchair or bedside chair)?: A Lot Help needed to walk in hospital room?: Total Help needed climbing 3-5 steps with a railing? :  Total 6 Click Score: 12    End of Session Equipment Utilized During Treatment: Oxygen Activity Tolerance: Patient tolerated treatment well Patient left: in bed;with bed alarm set;with call bell/phone within reach Nurse Communication: Mobility status       Time: 1258-1310 PT Time Calculation (min) (ACUTE ONLY): 12 min  Charges:  $Therapeutic Exercise: 8-22 mins                    Danielle Dess, PTA 03/28/19, 1:49 PM

## 2019-03-28 NOTE — TOC Progression Note (Signed)
Transition of Care [facility]) - Progression Note    Patient Details  Name: Deborah Martinez MRN: [id number] Date of Birth: 1927/10/28  [facility] CM/SW Contact  Shawn Route, RN Phone Number: 03/28/2019, 1:29 PM  Clinical Narrative:      Left message for son Iantha Fallen to return call concerning discharge placement of his mother.  Left message with Kennith [facility] at [facility] to see if this is the Nursing Facility patient came from.  Asked Bedside to nurse to pass my phone number to son when he calls to check on his mother.         Expected Discharge Plan and Services                                                 Social Determinants of Health (SDOH) Interventions    Readmission Risk Interventions No flowsheet data found.

## 2019-03-28 NOTE — Progress Notes (Signed)
Patient returning to [facility].  D/c telemetry and piv.  Called report.  Ems to transport

## 2019-03-28 NOTE — Discharge Summary (Signed)
Physician Discharge Summary  JASKIRAN PATA ZOX:[id number] DOB: 06/28/1927 DOA: 03/23/2019  PCP: [name], MD  Admit date: 03/23/2019 Discharge date: 03/28/2019  Admitted From: Assisted living facility Disposition: Assisted living facility  Recommendations for Outpatient Follow-up:  1. Follow up with PCP in 1-2 weeks 2. Follow-up with cardiology and heart failure clinic  Home Health: No Equipment/Devices: None Discharge Condition: Stable CODE STATUS: Full Diet recommendation: Heart healthy  Brief/Interim Summary: WJX:BJYNW Deborah Martinez a 86 y.o.femalewith medical history significant ofatrial fibrillation, chronic anticoagulation on Eliquis, anxiety, hypertension, hyperlipidemia who is brought to the emergency department from her resident skilled nursing facility for shortness of breath. Patient has been experiencing approximately 3 to 4 days of worsening shortness of breath. She is on room air at baseline. Patient has chronic peripheral edema and currently has lower extremity wraps in place however endorses that her lower extremities have become more edematous recently. No chest pain. On my arrival patient only endorses fatigue and only admits to shortness of breath on prompting. She does not appear in extremis. No use of accessory muscles. Normal work of breathing. Covid was taken and is negative.  3/11: Patient seen and examined.  Patient's husband is at bedside this morning.  Patient feels well.  Remains on 2 L.  Cardiology consult appreciated.  Unclear regarding volume status.  Ins and outs do not seem to correlate with daily weights.  3/12: Patient seen and examined.  No family at bedside this morning.  Patient feels well.  Remains on 2 L oxygen.  Cardiology consult appreciated.  Volume status net negative.  3/13: Patient seen and examined.  Husband at bedside this morning.  Remains on 2 L oxygen.  Repeat chest x-ray demonstrating possible infiltrate.  Volume status 4 L  net negative since admission.  3/14: Patient seen and examined.  No family members at bedside this morning.  Patient was sleepy this morning rather difficult to arouse.  Remains on 2 L supplemental oxygen.  Volume status 6 liters net negative since admission.  Patient down 5 kg in weight since admission.  3/15: Patient was seen and examined.  Was sleepy this morning but easily aroused.  Clear and communicative.  Weaned off supplemental oxygen on room air.  Volume status 8 L net negative.  Weight down 4 kg.  Medically stable for discharge back home.  Discharge back to previous assisted living facility.  Discharge Diagnoses:  Active Problems:   Acute decompensated heart failure (HCC)  Possible CAP Chest x-ray shows possible infiltrate Possibly contributing to persistent hypoxia despite response to diuresis Completed 3 days of Rocephin in-house Completed 3 days of azithromycin in house Will prescribe additional 2 days of cefdinir empiric coverage on discharge Patient on room air at the time of discharge Titrate oxygen as tolerated  Acute respiratory failure with hypoxia, resolved Likely secondary to fluid overloaded state Possibly secondary to infectious process Resolved with empiric antibiotics and IV diuresis   Fluid overload Acute decompensated systolic heart failure, EF 25% Patient with no known history of heart failure Does have a history of atrial fibrillation Last echocardiogram in 2018, EF 55% with grade 1 diastolic dysfunction 2D echocardiogram: LVEF 25 to 30% Patient received 40 mg IV Lasix in ED Cardiology consult appreciated IV Lasix in-house Patient diuresed 8 L net negative Down 4 kg since admission Wean to room air Transition to 48 mg p.o. Lasix daily on discharge Continue metoprolol 50 mg daily Continue losartan per home dose Outpatient cardiology and heart failure clinic follow-up  Atrial fibrillation Chronic anticoagulation on Eliquis Patient is rate  controlled at this time Chest pain-free Continue metoprolol succinate 25 mg daily per home dose Continue Eliquis  Essential hypertension Continue home Cozaar 50 mg twice daily Continue metoprolol succinate 50 mg daily Lasix as above for fluid overload  Anxiety Dementia/cognitive decline Continue home bupropion Continue home Aricept Continue home sertraline  Hypothyroidism Continue home Synthroid  Constipation As needed bowel regimen  Weakness functional decline Continue home health PT OT on discharge   Discharge Instructions  Discharge Instructions    AMB referral to CHF clinic   Complete by: As directed    Amb Referral to Cardiac Rehabilitation   Complete by: As directed    Diagnosis: Heart Failure (see criteria below if ordering Phase II)   Heart Failure Type: Chronic Systolic & Diastolic   After initial evaluation and assessments completed: Virtual Based Care may be provided alone or in conjunction with Phase 2 Cardiac Rehab based on patient barriers.: Yes   Diet - low sodium heart healthy   Complete by: As directed    Increase activity slowly   Complete by: As directed      Allergies as of 03/28/2019   No Known Allergies     Medication List    STOP taking these medications   hydrochlorothiazide 12.5 MG capsule Commonly known as: MICROZIDE     TAKE these medications   acetaminophen 325 MG tablet Commonly known as: TYLENOL Take 2 tablets (650 mg total) by mouth every 6 (six) hours as needed for mild pain or fever.   alendronate 70 MG tablet Commonly known as: FOSAMAX Take 70 mg by mouth once a week. Take with a full glass of water on an empty stomach.   buPROPion 100 MG 12 hr tablet Commonly known as: WELLBUTRIN SR Take 100 mg by mouth daily.   calcium carbonate 500 MG chewable tablet Commonly known as: TUMS - dosed in mg elemental calcium Chew 2 tablets (400 mg of elemental calcium total) by mouth 2 (two) times daily as needed for  indigestion or heartburn. What changed: when to take this   cefdinir 300 MG capsule Commonly known as: OMNICEF Take 1 capsule (300 mg total) by mouth every 12 (twelve) hours for 2 days. Start taking on: March 29, 2019   docusate sodium 100 MG capsule Commonly known as: COLACE Take 1 capsule (100 mg total) by mouth 2 (two) times daily.   donepezil 5 MG tablet Commonly known as: ARICEPT Take 5 mg by mouth at bedtime.   Eliquis 5 MG Tabs tablet Generic drug: apixaban Take 5 mg by mouth 2 (two) times daily.   feeding supplement (ENSURE ENLIVE) Liqd Take 237 mLs by mouth 2 (two) times daily between meals.   feeding supplement (PRO-STAT SUGAR FREE 64) Liqd Take 30 mLs by mouth daily.   ferrous sulfate 325 (65 FE) MG tablet Take 325 mg by mouth daily with breakfast.   furosemide 40 MG tablet Commonly known as: LASIX Take 1 tablet (40 mg total) by mouth daily. Start taking on: March 29, 2019   HYDROcodone-acetaminophen 5-325 MG tablet Commonly known as: NORCO/VICODIN Take 1 tablet by mouth every 4 (four) hours as needed for moderate pain.   levothyroxine 50 MCG tablet Commonly known as: SYNTHROID Take 50 mcg by mouth every morning.   losartan 50 MG tablet Commonly known as: COZAAR Take 50 mg by mouth 2 (two) times daily.   meclizine 12.5 MG tablet Commonly known as: ANTIVERT Take 1 tablet (  12.5 mg total) by mouth 3 (three) times daily as needed for dizziness.   metoprolol succinate 50 MG 24 hr tablet Commonly known as: TOPROL-XL Take 1 tablet (50 mg total) by mouth daily. What changed:   medication strength  how much to take   multivitamin-iron-minerals-folic acid chewable tablet Chew 1 tablet by mouth daily.   ondansetron 4 MG tablet Commonly known as: ZOFRAN Take 1 tablet (4 mg total) by mouth every 6 (six) hours as needed for nausea.   phenylephrine-shark liver oil-mineral oil-petrolatum 0.25-3-14-71.9 % rectal ointment Commonly known as: PREPARATION  H Place 1 application rectally 4 (four) times daily as needed for hemorrhoids.   senna-docusate 8.6-50 MG tablet Commonly known as: Senokot-S Take 1 tablet by mouth at bedtime as needed for moderate constipation.   sertraline 100 MG tablet Commonly known as: ZOLOFT Take 100 mg by mouth daily.      Follow-up Information    [facility] Follow up on 04/04/2019.   Specialty: Cardiology Why: at 12:30pm. Enter through the [facility] entrance Contact information: [address] [place] [address] [phone]         No Known Allergies  Consultations:  Cardiology-[facility]   Procedures/Studies: DG Chest Port 1 View  Result Date: 03/26/2019 CLINICAL DATA:  Hypoxia per physician order. Pt admitted 03/23/2019 for CHF exacerbation and PNA. Physician notes state - "Patient has been experiencing approximately 3 to 4 days of worsening shortness of breath. She is on room air at baseline. Patient has chronic peripheral edema and currently has lower extremity wraps in place however endorses that her lower extremities have become more edematous recently." Hx - HTN, former smoker. EXAM: PORTABLE CHEST 1 VIEW COMPARISON:  03/23/2019 FINDINGS: There is increased opacity at the right lung base compared to the prior exam consistent with an increase in pleural fluid. Moderate left pleural effusion is similar to the prior study. Hazy airspace opacities in the right upper lung are unchanged. No pneumothorax. Mild enlargement of the cardiopericardial silhouette. IMPRESSION: 1. Increase right pleural effusions since the prior study now obscuring the hemidiaphragm. Stable moderate left pleural effusion. 2. No change in the hazy right upper lung airspace opacities suggesting multifocal pneumonia. Electronically Signed   By: Amie Portland M.D.   On: 03/26/2019 11:00   DG Chest Portable 1 View  Result Date: 03/23/2019 CLINICAL DATA:  Short of  breath. EXAM: PORTABLE CHEST 1 VIEW COMPARISON:  11/19/2018 FINDINGS: Normal heart size. Aortic atherosclerosis. Bilateral pleural effusions identified left greater than right. The mild interstitial edema. Airspace disease within the right upper lobe and left lower lobe noted. IMPRESSION: Right upper lobe and left lower lobe airspace densities concerning for multifocal infection. Followup PA and lateral chest X-ray is recommended in 3-4 weeks following trial of antibiotic therapy to ensure resolution and exclude underlying malignancy. Small pleural effusions and mild interstitial edema suggesting CHF. Aortic Atherosclerosis (ICD10-I70.0). Electronically Signed   By: Signa Kell M.D.   On: 03/23/2019 12:03   ECHOCARDIOGRAM COMPLETE  Result Date: 03/24/2019    ECHOCARDIOGRAM REPORT   Patient Name:   Deborah Martinez Date of Exam: 03/23/2019 Medical Rec #:  [id number]    Height:       66.0 in Accession #:    [id number]   Weight:       160.3 lb Date of Birth:  Sep 14, 1927    BSA:          1.820 m Patient Age:  86 years     BP:           152/103 mmHg Patient Gender: F            HR:           81 bpm. Exam Location:  [facility] Procedure: 2D Echo, Cardiac Doppler and Color Doppler Indications:     I50.21 Acute Systolic Congestive Heart Failure  History:         Patient has prior history of Echocardiogram examinations, most                  recent 01/11/2017. Risk Factors:Hypertension and Dyslipidemia.                  Thyroid disease.  Sonographer:     Sedonia Small Rodgers-Jones Referring Phys:  [id number] Tresa Moore Diagnosing Phys: Marcina Millard MD IMPRESSIONS  1. Left ventricular ejection fraction, by estimation, is 25 to 30%. The left ventricle has severely decreased function. The left ventricle has no regional wall motion abnormalities. Left ventricular diastolic parameters were normal.  2. Right ventricular systolic function is normal. The right ventricular size is normal. There is moderately elevated pulmonary  artery systolic pressure.  3. The mitral valve is normal in structure. Moderate to severe mitral valve regurgitation. No evidence of mitral stenosis.  4. Tricuspid valve regurgitation is moderate to severe.  5. The aortic valve is normal in structure. Aortic valve regurgitation is not visualized. No aortic stenosis is present.  6. The inferior vena cava is normal in size with greater than 50% respiratory variability, suggesting right atrial pressure of 3 mmHg. FINDINGS  Left Ventricle: Left ventricular ejection fraction, by estimation, is 25 to 30%. The left ventricle has severely decreased function. The left ventricle has no regional wall motion abnormalities. The left ventricular internal cavity size was normal in size. There is no left ventricular hypertrophy. Left ventricular diastolic parameters were normal. Right Ventricle: The right ventricular size is normal. No increase in right ventricular wall thickness. Right ventricular systolic function is normal. There is moderately elevated pulmonary artery systolic pressure. The tricuspid regurgitant velocity is 3.60 m/s, and with an assumed right atrial pressure of 10 mmHg, the estimated right ventricular systolic pressure is 61.8 mmHg. Left Atrium: Left atrial size was normal in size. Right Atrium: Right atrial size was normal in size. Pericardium: There is no evidence of pericardial effusion. Mitral Valve: The mitral valve is normal in structure. Normal mobility of the mitral valve leaflets. Moderate to severe mitral valve regurgitation. No evidence of mitral valve stenosis. Tricuspid Valve: The tricuspid valve is normal in structure. Tricuspid valve regurgitation is moderate to severe. No evidence of tricuspid stenosis. Aortic Valve: The aortic valve is normal in structure. Aortic valve regurgitation is not visualized. No aortic stenosis is present. Pulmonic Valve: The pulmonic valve was normal in structure. Pulmonic valve regurgitation is not visualized. No  evidence of pulmonic stenosis. Aorta: The aortic root is normal in size and structure. Venous: The inferior vena cava is normal in size with greater than 50% respiratory variability, suggesting right atrial pressure of 3 mmHg. IAS/Shunts: No atrial level shunt detected by color flow Doppler.  LEFT VENTRICLE PLAX 2D LVIDd:         5.36 cm  Diastology LVIDs:         4.63 cm  LV e' lateral:   3.70 cm/s LV PW:         0.82 cm  LV E/e' lateral: 25.3 LV IVS:  0.81 cm  LV e' medial:    4.03 cm/s LVOT diam:     1.90 cm  LV E/e' medial:  23.2 LV SV:         36 LV SV Index:   20 LVOT Area:     2.84 cm  RIGHT VENTRICLE RV Basal diam:  4.20 cm RV S prime:     16.00 cm/s TAPSE (M-mode): 1.9 cm LEFT ATRIUM             Index       RIGHT ATRIUM           Index LA diam:        3.50 cm 1.92 cm/m  RA Area:     14.00 cm LA Vol (A2C):   68.1 ml 37.41 ml/m RA Volume:   37.90 ml  20.82 ml/m LA Vol (A4C):   57.3 ml 31.48 ml/m LA Biplane Vol: 63.9 ml 35.10 ml/m  AORTIC VALVE LVOT Vmax:   79.10 cm/s LVOT Vmean:  52.700 cm/s LVOT VTI:    0.126 m  AORTA Ao Root diam: 3.40 cm MITRAL VALVE               TRICUSPID VALVE MV Area (PHT): 4.68 cm    TR Peak grad:   51.8 mmHg MV Decel Time: 162 msec    TR Vmax:        360.00 cm/s MV E velocity: 93.60 cm/s MV A velocity: 84.40 cm/s  SHUNTS MV E/A ratio:  1.11        Systemic VTI:  0.13 m                            Systemic Diam: 1.90 cm Isaias Cowman MD Electronically signed by Isaias Cowman MD Signature Date/Time: 03/24/2019/4:10:37 PM    Final    (Echo, Carotid, EGD, Colonoscopy, ERCP)    Subjective: Patient seen and examined Titrated to room air Stable for discharge home  Discharge Exam: Vitals:   03/28/19 1224 03/28/19 1419  BP: (!) 157/97   Pulse: 73   Resp: 19   Temp: 98.3 F (36.8 C)   SpO2: 100% 95%   Vitals:   03/28/19 0434 03/28/19 0833 03/28/19 1224 03/28/19 1419  BP: (!) 146/89 (!) 151/92 (!) 157/97   Pulse: 78 82 73   Resp:  19 19   Temp:  98.2 F (36.8 C) 98.2 F (36.8 C) 98.3 F (36.8 C)   TempSrc: Oral  Oral   SpO2: 95% 96% 100% 95%  Weight: 66.9 kg     Height:        General: Pt is alert, awake, not in acute distress Cardiovascular: RRR, S1/S2 +, no rubs, no gallops Respiratory: CTA bilaterally, no wheezing, no rhonchi Abdominal: Soft, NT, ND, bowel sounds + Extremities: no edema, no cyanosis    The results of significant diagnostics from this hospitalization (including imaging, microbiology, ancillary and laboratory) are listed below for reference.     Microbiology: Recent Results (from the past 240 hour(s))  Respiratory Panel by RT PCR (Flu A&B, Covid) - Nasopharyngeal Swab     Status: None   Collection Time: 03/23/19 11:54 AM   Specimen: Nasopharyngeal Swab  Result Value Ref Range Status   SARS Coronavirus 2 by RT PCR NEGATIVE NEGATIVE Final    Comment: (NOTE) SARS-CoV-2 target nucleic acids are NOT DETECTED. The SARS-CoV-2 RNA is generally detectable in upper respiratoy specimens during the acute phase of infection.  The lowest concentration of SARS-CoV-2 viral copies this assay can detect is 131 copies/mL. A negative result does not preclude SARS-Cov-2 infection and should not be used as the sole basis for treatment or other patient management decisions. A negative result may occur with  improper specimen collection/handling, submission of specimen other than nasopharyngeal swab, presence of viral mutation(s) within the areas targeted by this assay, and inadequate number of viral copies (<131 copies/mL). A negative result must be combined with clinical observations, patient history, and epidemiological information. The expected result is Negative. Fact Sheet for Patients:  https://www.moore.com/https://www.fda.gov/media/142436/download Fact Sheet for Healthcare Providers:  https://www.young.biz/https://www.fda.gov/media/142435/download This test is not yet ap proved or cleared by the [place] FDA and  has been authorized for detection  and/or diagnosis of SARS-CoV-2 by FDA under an Emergency Use Authorization (EUA). This EUA will remain  in effect (meaning this test can be used) for the duration of the COVID-19 declaration under Section 564(b)(1) of the Act, 21 U.S.C. section 360bbb-3(b)(1), unless the authorization is terminated or revoked sooner.    Influenza A by PCR NEGATIVE NEGATIVE Final   Influenza B by PCR NEGATIVE NEGATIVE Final    Comment: (NOTE) The Xpert Xpress SARS-CoV-2/FLU/RSV assay is intended as an aid in  the diagnosis of influenza from Nasopharyngeal swab specimens and  should not be used as a sole basis for treatment. Nasal washings and  aspirates are unacceptable for Xpert Xpress SARS-CoV-2/FLU/RSV  testing. Fact Sheet for Patients: https://www.moore.com/https://www.fda.gov/media/142436/download Fact Sheet for Healthcare Providers: https://www.young.biz/https://www.fda.gov/media/142435/download This test is not yet approved or cleared by the [place] FDA and  has been authorized for detection and/or diagnosis of SARS-CoV-2 by  FDA under an Emergency Use Authorization (EUA). This EUA will remain  in effect (meaning this test can be used) for the duration of the  Covid-19 declaration under Section 564(b)(1) of the Act, 21  U.S.C. section 360bbb-3(b)(1), unless the authorization is  terminated or revoked. Performed at [facility], [address] [place], [place] [address]   Blood culture (routine x 2)     Status: None   Collection Time: 03/23/19  1:53 PM   Specimen: BLOOD  Result Value Ref Range Status   Specimen Description BLOOD BLOOD LEFT ARM  Final   Special Requests   Final    BOTTLES DRAWN AEROBIC AND ANAEROBIC Blood Culture results may not be optimal due to an excessive volume of blood received in culture bottles   Culture   Final    NO GROWTH 5 DAYS Performed at [facility], [address] [place], [place] [address]    Report Status 03/28/2019 FINAL  Final  Blood culture (routine x 2)     Status:  None   Collection Time: 03/23/19  1:58 PM   Specimen: BLOOD  Result Value Ref Range Status   Specimen Description BLOOD BLOOD LEFT HAND  Final   Special Requests   Final    BOTTLES DRAWN AEROBIC AND ANAEROBIC Blood Culture results may not be optimal due to an inadequate volume of blood received in culture bottles   Culture   Final    NO GROWTH 5 DAYS Performed at [facility], [address] [place], [place] [address]    Report Status 03/28/2019 FINAL  Final  MRSA PCR Screening     Status: None   Collection Time: 03/23/19  5:58 PM   Specimen: Nasopharyngeal  Result Value Ref Range Status   MRSA by PCR NEGATIVE NEGATIVE Final    Comment:  The GeneXpert MRSA Assay (FDA approved for NASAL specimens only), is one component of a comprehensive MRSA colonization surveillance program. It is not intended to diagnose MRSA infection nor to guide or monitor treatment for MRSA infections. Performed at [facility], [address] [place], [place] [address]      Labs: BNP (last 3 results) Recent Labs    03/23/19 1145  BNP 1,520.0*   Basic Metabolic Panel: Recent Labs  Lab 03/24/19 0445 03/25/19 0316 03/26/19 0530 03/27/19 0644 03/28/19 0425  NA 135 136 135 137 139  K 3.0* 3.5 4.7 4.5 4.9  CL 98 98 97* 98 99  CO2 28 29 29 29 28   GLUCOSE 110* 103* 125* 120* 131*  BUN 18 24* 20 22 28*  CREATININE 0.78 0.89 0.71 0.91 0.78  CALCIUM 8.2* 8.4* 8.0* 8.1* 8.7*  MG 1.9 2.0  --   --   --    Liver Function Tests: Recent Labs  Lab 03/23/19 1145  AST 30  ALT 17  ALKPHOS 74  BILITOT 1.1  PROT 5.9*  ALBUMIN 3.1*   No results for input(s): LIPASE, AMYLASE in the last 168 hours. No results for input(s): AMMONIA in the last 168 hours. CBC: Recent Labs  Lab 03/23/19 1145 03/27/19 0644  WBC 6.1 8.3  HGB 10.2* 9.9*  HCT 32.2* 32.1*  MCV 93.1 94.1  PLT 299 283   Cardiac Enzymes: No results for input(s): CKTOTAL, CKMB, CKMBINDEX, TROPONINI in the  last 168 hours. BNP: Invalid input(s): POCBNP CBG: No results for input(s): GLUCAP in the last 168 hours. D-Dimer No results for input(s): DDIMER in the last 72 hours. Hgb A1c No results for input(s): HGBA1C in the last 72 hours. Lipid Profile No results for input(s): CHOL, HDL, LDLCALC, TRIG, CHOLHDL, LDLDIRECT in the last 72 hours. Thyroid function studies No results for input(s): TSH, T4TOTAL, T3FREE, THYROIDAB in the last 72 hours.  Invalid input(s): FREET3 Anemia work up No results for input(s): VITAMINB12, FOLATE, FERRITIN, TIBC, IRON, RETICCTPCT in the last 72 hours. Urinalysis    Component Value Date/Time   COLORURINE YELLOW (A) 11/20/2018 0126   APPEARANCEUR HAZY (A) 11/20/2018 0126   LABSPEC 1.018 11/20/2018 0126   PHURINE 6.0 11/20/2018 0126   GLUCOSEU NEGATIVE 11/20/2018 0126   HGBUR NEGATIVE 11/20/2018 0126   BILIRUBINUR NEGATIVE 11/20/2018 0126   BILIRUBINUR neg 01/22/2017 1445   KETONESUR 5 (A) 11/20/2018 0126   PROTEINUR NEGATIVE 11/20/2018 0126   UROBILINOGEN 0.2 01/22/2017 1445   NITRITE NEGATIVE 11/20/2018 0126   LEUKOCYTESUR NEGATIVE 11/20/2018 0126   Sepsis Labs Invalid input(s): PROCALCITONIN,  WBC,  LACTICIDVEN Microbiology Recent Results (from the past 240 hour(s))  Respiratory Panel by RT PCR (Flu A&B, Covid) - Nasopharyngeal Swab     Status: None   Collection Time: 03/23/19 11:54 AM   Specimen: Nasopharyngeal Swab  Result Value Ref Range Status   SARS Coronavirus 2 by RT PCR NEGATIVE NEGATIVE Final    Comment: (NOTE) SARS-CoV-2 target nucleic acids are NOT DETECTED. The SARS-CoV-2 RNA is generally detectable in upper respiratoy specimens during the acute phase of infection. The lowest concentration of SARS-CoV-2 viral copies this assay can detect is 131 copies/mL. A negative result does not preclude SARS-Cov-2 infection and should not be used as the sole basis for treatment or other patient management decisions. A negative result may occur  with  improper specimen collection/handling, submission of specimen other than nasopharyngeal swab, presence of viral mutation(s) within the areas targeted by this assay, and inadequate  number of viral copies (<131 copies/mL). A negative result must be combined with clinical observations, patient history, and epidemiological information. The expected result is Negative. Fact Sheet for Patients:  https://www.moore.com/ Fact Sheet for Healthcare Providers:  https://www.young.biz/ This test is not yet ap proved or cleared by the [place] FDA and  has been authorized for detection and/or diagnosis of SARS-CoV-2 by FDA under an Emergency Use Authorization (EUA). This EUA will remain  in effect (meaning this test can be used) for the duration of the COVID-19 declaration under Section 564(b)(1) of the Act, 21 U.S.C. section 360bbb-3(b)(1), unless the authorization is terminated or revoked sooner.    Influenza A by PCR NEGATIVE NEGATIVE Final   Influenza B by PCR NEGATIVE NEGATIVE Final    Comment: (NOTE) The Xpert Xpress SARS-CoV-2/FLU/RSV assay is intended as an aid in  the diagnosis of influenza from Nasopharyngeal swab specimens and  should not be used as a sole basis for treatment. Nasal washings and  aspirates are unacceptable for Xpert Xpress SARS-CoV-2/FLU/RSV  testing. Fact Sheet for Patients: https://www.moore.com/ Fact Sheet for Healthcare Providers: https://www.young.biz/ This test is not yet approved or cleared by the [place] FDA and  has been authorized for detection and/or diagnosis of SARS-CoV-2 by  FDA under an Emergency Use Authorization (EUA). This EUA will remain  in effect (meaning this test can be used) for the duration of the  Covid-19 declaration under Section 564(b)(1) of the Act, 21  U.S.C. section 360bbb-3(b)(1), unless the authorization is  terminated or revoked. Performed  at [facility], [address] [place], [place] [address]   Blood culture (routine x 2)     Status: None   Collection Time: 03/23/19  1:53 PM   Specimen: BLOOD  Result Value Ref Range Status   Specimen Description BLOOD BLOOD LEFT ARM  Final   Special Requests   Final    BOTTLES DRAWN AEROBIC AND ANAEROBIC Blood Culture results may not be optimal due to an excessive volume of blood received in culture bottles   Culture   Final    NO GROWTH 5 DAYS Performed at [facility], [address] [place], [place] [address]    Report Status 03/28/2019 FINAL  Final  Blood culture (routine x 2)     Status: None   Collection Time: 03/23/19  1:58 PM   Specimen: BLOOD  Result Value Ref Range Status   Specimen Description BLOOD BLOOD LEFT HAND  Final   Special Requests   Final    BOTTLES DRAWN AEROBIC AND ANAEROBIC Blood Culture results may not be optimal due to an inadequate volume of blood received in culture bottles   Culture   Final    NO GROWTH 5 DAYS Performed at [facility], [address] [place], [place] [address]    Report Status 03/28/2019 FINAL  Final  MRSA PCR Screening     Status: None   Collection Time: 03/23/19  5:58 PM   Specimen: Nasopharyngeal  Result Value Ref Range Status   MRSA by PCR NEGATIVE NEGATIVE Final    Comment:        The GeneXpert MRSA Assay (FDA approved for NASAL specimens only), is one component of a comprehensive MRSA colonization surveillance program. It is not intended to diagnose MRSA infection nor to guide or monitor treatment for MRSA infections. Performed at [facility], [address] [place], [place] [address]      Time coordinating discharge: Over 30 minutes  SIGNED:  Tresa Moore, MD  [facility] 03/28/2019, 2:58 PM Pager   If 7PM-7AM, please contact night-coverage

## 2019-03-29 LAB — SARS CORONAVIRUS 2 (TAT 6-24 HRS): SARS Coronavirus 2: NEGATIVE

## 2019-04-01 ENCOUNTER — Telehealth: Payer: Self-pay | Admitting: Family

## 2019-04-01 NOTE — Telephone Encounter (Signed)
Spoke with nurse at [facility] to follow up about patient who she said is doing very well. She is getting her strength back and has no complaints. She is taking her meds as prescribed, daily weights, and on a heart healthy diet. They Confirmed her New Patient [facility] Clinic appointment we have scheduled for 3/22.   Deetta Perla, [place]

## 2019-04-03 NOTE — Progress Notes (Signed)
Patient ID: Deborah Martinez, female    DOB: [birth year], 86 y.o.   MRN: [id number]  HPI  Deborah Martinez is a 86 y/o female with a history of atrial fibrillation, hyperlipidemia, HTN, thyroid disease, anxiety, depression, lymphedema, previous tobacco use and chronic heart failure.   Echo report from 03/23/19 reviewed and showed an EF of 25-30% along with moderate elevated PA pressure and moderate/severe MR/TR.  Admitted 03/23/19 due to acute heart failure. Cardiology and wound consults obtained. Initially needed IV lasix and then transitioned to oral diuretics. Net loss of 8L. Antibiotics given for possible pneumonia. Discharged after 5 days.   She presents today for her initial visit with a chief complaint of minimal fatigue upon moderate exertion. She describes this as chronic in nature having been present for several years. She has associated light-headedness, pedal edema and bilateral leg pain along with this. She denies any difficulty sleeping, abdominal distention, palpitations, chest pain, shortness of breath, cough or weight gain.   Past Medical History:  Diagnosis Date  . Anxiety   . Arrhythmia    atrial fibrillation  . CHF (congestive heart failure) (HCC)   . Constipation   . Cystocele   . Depression   . Hemorrhoid   . Hypercholesterolemia   . Hypertension   . Incomplete bladder emptying   . Lymphedema   . Postmenopausal atrophic vaginitis   . Rectocele   . Thyroid disease   . UTI (lower urinary tract infection)   . Vaginal enterocele    Past Surgical History:  Procedure Laterality Date  . APPENDECTOMY    . EYE SURGERY  2012   remve cataract  . INTRAMEDULLARY (IM) NAIL INTERTROCHANTERIC Left 11/20/2018   Procedure: INTRAMEDULLARY (IM) NAIL INTERTROCHANTRIC;  Surgeon: Christena Flake, MD;  Location: [facility] ORS;  Service: Orthopedics;  Laterality: Left;  . TOTAL ABDOMINAL HYSTERECTOMY  1977   Family History  Problem Relation Age of Onset  . Heart attack Father   . Diabetes Brother    . Cancer Neg Hx    Social History   Tobacco Use  . Smoking status: Former Games developer  . Smokeless tobacco: Never Used  Substance Use Topics  . Alcohol use: No   No Known Allergies Prior to Admission medications   Medication Sig Start Date End Date Taking? Authorizing Provider  acetaminophen (TYLENOL) 325 MG tablet Take 2 tablets (650 mg total) by mouth every 6 (six) hours as needed for mild pain or fever. 11/23/18  Yes Esaw Grandchild A, DO  alendronate (FOSAMAX) 70 MG tablet Take 70 mg by mouth once a week. Take with a full glass of water on an empty stomach.   Yes [provider]  buPROPion (WELLBUTRIN SR) 100 MG 12 hr tablet Take 100 mg by mouth daily. 10/14/18  Yes [provider]  calcium carbonate (TUMS - DOSED IN MG ELEMENTAL CALCIUM) 500 MG chewable tablet Chew 2 tablets (400 mg of elemental calcium total) by mouth 2 (two) times daily as needed for indigestion or heartburn. Patient taking differently: Chew 2 tablets by mouth in the morning and at bedtime.  11/23/18  Yes Esaw Grandchild A, DO  docusate sodium (COLACE) 100 MG capsule Take 1 capsule (100 mg total) by mouth 2 (two) times daily. 11/23/18  Yes Esaw Grandchild A, DO  donepezil (ARICEPT) 5 MG tablet Take 5 mg by mouth at bedtime. 11/18/18  Yes [provider]  ELIQUIS 5 MG TABS tablet Take 5 mg by mouth 2 (two) times daily. 11/18/18  Yes [provider]  feeding supplement, ENSURE ENLIVE, (ENSURE ENLIVE) LIQD Take 237 mLs by mouth 2 (two) times daily between meals. 11/23/18  Yes Esaw Grandchild A, DO  ferrous sulfate 325 (65 FE) MG tablet Take 325 mg by mouth daily with breakfast.   Yes [provider]  furosemide (LASIX) 40 MG tablet Take 1 tablet (40 mg total) by mouth daily. 03/29/19 04/28/19 Yes Sreenath, Sudheer B, MD  HYDROcodone-acetaminophen (NORCO/VICODIN) 5-325 MG tablet Take 1 tablet by mouth every 4 (four) hours as needed for moderate pain. 11/23/18  Yes Esaw Grandchild A, DO   levothyroxine (SYNTHROID, LEVOTHROID) 50 MCG tablet Take 50 mcg by mouth every morning.    Yes [provider]  losartan (COZAAR) 50 MG tablet Take 50 mg by mouth 2 (two) times daily. 11/11/18  Yes [provider]  meclizine (ANTIVERT) 12.5 MG tablet Take 1 tablet (12.5 mg total) by mouth 3 (three) times daily as needed for dizziness. 02/03/17  Yes Katha Hamming, MD  metoprolol succinate (TOPROL-XL) 50 MG 24 hr tablet Take 1 tablet (50 mg total) by mouth daily. 03/28/19 04/27/19 Yes Sreenath, Sudheer B, MD  multivitamin-iron-minerals-folic acid (CENTRUM) chewable tablet Chew 1 tablet by mouth daily.   Yes [provider]  ondansetron (ZOFRAN) 4 MG tablet Take 1 tablet (4 mg total) by mouth every 6 (six) hours as needed for nausea. 11/23/18  Yes Esaw Grandchild A, DO  phenylephrine-shark liver oil-mineral oil-petrolatum (PREPARATION H) 0.25-3-14-71.9 % rectal ointment Place 1 application rectally 4 (four) times daily as needed for hemorrhoids.   Yes [provider]  senna-docusate (SENOKOT-S) 8.6-50 MG tablet Take 1 tablet by mouth at bedtime as needed for moderate constipation. 11/23/18  Yes Esaw Grandchild A, DO  sertraline (ZOLOFT) 100 MG tablet Take 100 mg by mouth daily. 08/20/18  Yes [provider]    Review of Systems  Constitutional: Positive for fatigue. Negative for appetite change.  HENT: Negative for congestion, postnasal drip and sore throat.   Eyes: Negative.   Respiratory: Negative for cough and shortness of breath.   Cardiovascular: Positive for leg swelling. Negative for chest pain and palpitations.  Gastrointestinal: Negative for abdominal distention and abdominal pain.  Endocrine: Negative.   Genitourinary: Negative.   Musculoskeletal: Positive for arthralgias (legs). Negative for back pain.  Skin: Negative.   Allergic/Immunologic: Negative.   Neurological: Positive for light-headedness (on occasion). Negative for dizziness.   Hematological: Negative for adenopathy. Does not bruise/bleed easily.  Psychiatric/Behavioral: Negative for dysphoric mood and sleep disturbance (sleeping on 1 pillow). The patient is not nervous/anxious.    Vitals:   04/04/19 1248  BP: 122/74  Pulse: 66  Resp: 16  SpO2: 100%  Weight: 148 lb 4 oz (67.2 kg)  Height: 5\' 5"  (1.651 m)   Wt Readings from Last 3 Encounters:  04/04/19 148 lb 4 oz (67.2 kg)  03/28/19 147 lb 6.4 oz (66.9 kg)  11/19/18 135 lb (61.2 kg)   Lab Results  Component Value Date   CREATININE 0.78 03/28/2019   CREATININE 0.91 03/27/2019   CREATININE 0.71 03/26/2019    Physical Exam Vitals and nursing note reviewed.  Constitutional:      Appearance: Normal appearance.  HENT:     Head: Normocephalic and atraumatic.  Cardiovascular:     Rate and Rhythm: Normal rate and regular rhythm.  Pulmonary:     Effort: Pulmonary effort is normal.     Breath sounds: No wheezing or rales.  Abdominal:     General:  Abdomen is flat. There is no distension.     Palpations: Abdomen is soft.  Musculoskeletal:        General: No tenderness.     Cervical back: Normal range of motion and neck supple.     Right lower leg: Edema present.     Left lower leg: Edema present.  Skin:    General: Skin is warm and dry.  Neurological:     General: No focal deficit present.     Mental Status: She is alert and oriented to person, place, and time.  Psychiatric:        Mood and Affect: Mood normal.        Behavior: Behavior normal.     Assessment & Plan:  1: Chronic heart failure with reduced ejection fraction- - NYHA class II - euvolemic today - order written for facility to weigh patient daily and call for an overnight weight gain of >2 pounds or a weekly weight gain of >5 pounds - not adding salt to her food - order written for PT evaluation at facility - order written for patient to have between 50-60 ounces of fluid daily - saw cardiology Nehemiah Massed) 03/10/19 - discussed  changing her losartan to entresto at her next visit if BP still looks good - unsure if HR can tolerate titration of metoprolol succinate - consider adding farxiga or jardiance - BNP 03/23/19 was 1520.0  2: HTN- - BP looks good today - saw PCP Kary Kos) 07/12/2018 - BMP 03/28/19 reviewed and showed sodium 139, potassium 4.4, creatinine 0.78 and GFR >60  3: Atrial fibrillation- - on apixaban - heart rate regular today  4: Lymphedema- - stage 2 - legs are currently wrapped in UNNA boots  - elevating her legs some and she was encouraged to elevate them when sitting for long periods of time - order written for PT eval - consider lymphapress compression boots if edema persists   Facility medication list was reviewed.  Return in 6 weeks or sooner for any questions/problems before then.

## 2019-04-04 ENCOUNTER — Other Ambulatory Visit: Payer: Self-pay | Admitting: Family

## 2019-04-04 ENCOUNTER — Encounter: Payer: Self-pay | Admitting: Family

## 2019-04-04 ENCOUNTER — Other Ambulatory Visit: Payer: Self-pay

## 2019-04-04 ENCOUNTER — Ambulatory Visit: Payer: Medicare Other | Attending: Family | Admitting: Family

## 2019-04-04 VITALS — BP 122/74 | HR 66 | Resp 16 | Ht 65.0 in | Wt 148.2 lb

## 2019-04-04 DIAGNOSIS — Z87891 Personal history of nicotine dependence: Secondary | ICD-10-CM | POA: Insufficient documentation

## 2019-04-04 DIAGNOSIS — R42 Dizziness and giddiness: Secondary | ICD-10-CM | POA: Insufficient documentation

## 2019-04-04 DIAGNOSIS — I110 Hypertensive heart disease with heart failure: Secondary | ICD-10-CM | POA: Diagnosis not present

## 2019-04-04 DIAGNOSIS — Z7901 Long term (current) use of anticoagulants: Secondary | ICD-10-CM | POA: Diagnosis not present

## 2019-04-04 DIAGNOSIS — R5383 Other fatigue: Secondary | ICD-10-CM | POA: Insufficient documentation

## 2019-04-04 DIAGNOSIS — M79605 Pain in left leg: Secondary | ICD-10-CM | POA: Insufficient documentation

## 2019-04-04 DIAGNOSIS — E079 Disorder of thyroid, unspecified: Secondary | ICD-10-CM | POA: Diagnosis not present

## 2019-04-04 DIAGNOSIS — I480 Paroxysmal atrial fibrillation: Secondary | ICD-10-CM

## 2019-04-04 DIAGNOSIS — F329 Major depressive disorder, single episode, unspecified: Secondary | ICD-10-CM | POA: Diagnosis not present

## 2019-04-04 DIAGNOSIS — I10 Essential (primary) hypertension: Secondary | ICD-10-CM

## 2019-04-04 DIAGNOSIS — F419 Anxiety disorder, unspecified: Secondary | ICD-10-CM | POA: Diagnosis not present

## 2019-04-04 DIAGNOSIS — Z79899 Other long term (current) drug therapy: Secondary | ICD-10-CM | POA: Diagnosis not present

## 2019-04-04 DIAGNOSIS — I5022 Chronic systolic (congestive) heart failure: Secondary | ICD-10-CM | POA: Diagnosis present

## 2019-04-04 DIAGNOSIS — E785 Hyperlipidemia, unspecified: Secondary | ICD-10-CM | POA: Insufficient documentation

## 2019-04-04 DIAGNOSIS — Z8249 Family history of ischemic heart disease and other diseases of the circulatory system: Secondary | ICD-10-CM | POA: Insufficient documentation

## 2019-04-04 DIAGNOSIS — I890 Lymphedema, not elsewhere classified: Secondary | ICD-10-CM | POA: Diagnosis not present

## 2019-04-04 DIAGNOSIS — M79604 Pain in right leg: Secondary | ICD-10-CM | POA: Insufficient documentation

## 2019-04-04 DIAGNOSIS — E7800 Pure hypercholesterolemia, unspecified: Secondary | ICD-10-CM | POA: Diagnosis not present

## 2019-04-04 NOTE — Patient Instructions (Signed)
Continue weighing daily and call for an overnight weight gain of > 2 pounds or a weekly weight gain of >5 pounds. 

## 2019-05-11 NOTE — Progress Notes (Signed)
Patient ID: Deborah Martinez, female    DOB: [birth year], 86 y.o.   MRN: [id number]  HPI  Deborah Martinez is a 86 y/o female with a history of atrial fibrillation, hyperlipidemia, HTN, thyroid disease, anxiety, depression, lymphedema, previous tobacco use and chronic heart failure.   Echo report from 03/23/19 reviewed and showed an EF of 25-30% along with moderate elevated PA pressure and moderate/severe MR/TR.  Admitted 03/23/19 due to acute heart failure. Cardiology and wound consults obtained. Initially needed IV lasix and then transitioned to oral diuretics. Net loss of 8L. Antibiotics given for possible pneumonia. Discharged after 5 days.   She presents today for a follow-up visit with a chief complaint of minimal fatigue upon moderate exertion. She describes this as chronic in nature having been present for several years. She denies any other symptoms and specifically denies any difficulty sleeping, dizziness, abdominal distention, palpitations, pedal edema, chest pain, shortness of breath, cough or weight gain.   Now that diuretic has been adjusted, her swelling is completely gone.   Past Medical History:  Diagnosis Date  . Anxiety   . Arrhythmia    atrial fibrillation  . CHF (congestive heart failure) (HCC)   . Constipation   . Cystocele   . Depression   . Hemorrhoid   . Hypercholesterolemia   . Hypertension   . Incomplete bladder emptying   . Lymphedema   . Postmenopausal atrophic vaginitis   . Rectocele   . Thyroid disease   . UTI (lower urinary tract infection)   . Vaginal enterocele    Past Surgical History:  Procedure Laterality Date  . APPENDECTOMY    . EYE SURGERY  2012   remve cataract  . INTRAMEDULLARY (IM) NAIL INTERTROCHANTERIC Left 11/20/2018   Procedure: INTRAMEDULLARY (IM) NAIL INTERTROCHANTRIC;  Surgeon: Christena Flake, MD;  Location: [facility] ORS;  Service: Orthopedics;  Laterality: Left;  . TOTAL ABDOMINAL HYSTERECTOMY  1977   Family History  Problem Relation Age of  Onset  . Heart attack Father   . Diabetes Brother   . Cancer Neg Hx    Social History   Tobacco Use  . Smoking status: Former Games developer  . Smokeless tobacco: Never Used  Substance Use Topics  . Alcohol use: No   No Known Allergies  Prior to Admission medications   Medication Sig Start Date End Date Taking? Authorizing Provider  acetaminophen (TYLENOL) 325 MG tablet Take 2 tablets (650 mg total) by mouth every 6 (six) hours as needed for mild pain or fever. 11/23/18  Yes Esaw Grandchild A, DO  alendronate (FOSAMAX) 70 MG tablet Take 70 mg by mouth once a week. Take with a full glass of water on an empty stomach.   Yes [provider]  buPROPion (WELLBUTRIN SR) 100 MG 12 hr tablet Take 100 mg by mouth daily. 10/14/18  Yes [provider]  calcium carbonate (TUMS - DOSED IN MG ELEMENTAL CALCIUM) 500 MG chewable tablet Chew 2 tablets (400 mg of elemental calcium total) by mouth 2 (two) times daily as needed for indigestion or heartburn. Patient taking differently: Chew 2 tablets by mouth in the morning and at bedtime.  11/23/18  Yes Esaw Grandchild A, DO  Cholecalciferol (VITAMIN D3) 1.25 MG (50000 UT) CAPS Take 50,000 Units by mouth every 30 (thirty) days.   Yes [provider]  docusate sodium (COLACE) 100 MG capsule Take 1 capsule (100 mg total) by mouth 2 (two) times daily. 11/23/18  Yes Esaw Grandchild A, DO  donepezil (  ARICEPT) 5 MG tablet Take 5 mg by mouth at bedtime. 11/18/18  Yes [provider]  ELIQUIS 5 MG TABS tablet Take 5 mg by mouth 2 (two) times daily. 11/18/18  Yes [provider]  feeding supplement, ENSURE ENLIVE, (ENSURE ENLIVE) LIQD Take 237 mLs by mouth 2 (two) times daily between meals. 11/23/18  Yes Nicole Kindred A, DO  ferrous sulfate 325 (65 FE) MG tablet Take 325 mg by mouth daily with breakfast.   Yes [provider]  furosemide (LASIX) 40 MG tablet Take 1 tablet (40 mg total) by mouth daily. Patient taking  differently: Take 40 mg by mouth 2 (two) times daily.  03/29/19 05/16/19 Yes Sreenath, Sudheer B, MD  levothyroxine (SYNTHROID, LEVOTHROID) 50 MCG tablet Take 50 mcg by mouth every morning.    Yes [provider]  losartan (COZAAR) 50 MG tablet Take 50 mg by mouth 2 (two) times daily. 11/11/18  Yes [provider]  meclizine (ANTIVERT) 12.5 MG tablet Take 1 tablet (12.5 mg total) by mouth 3 (three) times daily as needed for dizziness. 02/03/17  Yes Epifanio Lesches, MD  metoprolol succinate (TOPROL-XL) 50 MG 24 hr tablet Take 1 tablet (50 mg total) by mouth daily. 03/28/19 05/16/19 Yes Sreenath, Trula Slade, MD  phenylephrine-shark liver oil-mineral oil-petrolatum (PREPARATION H) 0.25-3-14-71.9 % rectal ointment Place 1 application rectally 4 (four) times daily as needed for hemorrhoids.   Yes [provider]  senna-docusate (SENOKOT-S) 8.6-50 MG tablet Take 1 tablet by mouth at bedtime as needed for moderate constipation. 11/23/18  Yes Nicole Kindred A, DO  sertraline (ZOLOFT) 100 MG tablet Take 100 mg by mouth daily. 08/20/18  Yes [provider]    Review of Systems  Constitutional: Positive for fatigue. Negative for appetite change.  HENT: Negative for congestion, postnasal drip and sore throat.   Eyes: Negative.   Respiratory: Negative for cough and shortness of breath.   Cardiovascular: Negative for chest pain, palpitations and leg swelling.  Gastrointestinal: Negative for abdominal distention and abdominal pain.  Endocrine: Negative.   Genitourinary: Negative.   Musculoskeletal: Negative for arthralgias and back pain.  Skin: Negative.   Allergic/Immunologic: Negative.   Neurological: Negative for dizziness and light-headedness.  Hematological: Negative for adenopathy. Does not bruise/bleed easily.  Psychiatric/Behavioral: Negative for dysphoric mood and sleep disturbance (sleeping on 1 pillow). The patient is not nervous/anxious.    Vitals:   05/16/19  1336  BP: 112/69  Pulse: 63  Resp: 16  SpO2: 98%  Weight: 128 lb 4 oz (58.2 kg)  Height: 5\' 4"  (1.626 m)   Wt Readings from Last 3 Encounters:  05/16/19 128 lb 4 oz (58.2 kg)  04/04/19 148 lb 4 oz (67.2 kg)  03/28/19 147 lb 6.4 oz (66.9 kg)    Lab Results  Component Value Date   CREATININE 0.78 03/28/2019   CREATININE 0.91 03/27/2019   CREATININE 0.71 03/26/2019    Physical Exam Vitals and nursing note reviewed.  Constitutional:      Appearance: Normal appearance.  HENT:     Head: Normocephalic and atraumatic.  Cardiovascular:     Rate and Rhythm: Normal rate and regular rhythm.  Pulmonary:     Effort: Pulmonary effort is normal.     Breath sounds: No wheezing or rales.  Abdominal:     General: Abdomen is flat. There is no distension.     Palpations: Abdomen is soft.  Musculoskeletal:        General: No tenderness.  Cervical back: Normal range of motion and neck supple.     Right lower leg: No edema.     Left lower leg: No edema.  Skin:    General: Skin is warm and dry.  Neurological:     General: No focal deficit present.     Mental Status: She is alert and oriented to person, place, and time.  Psychiatric:        Mood and Affect: Mood normal.        Behavior: Behavior normal.     Assessment & Plan:  1: Chronic heart failure with reduced ejection fraction- - NYHA class II - euvolemic today - getting weighed daily at [facility] and son says that her weight has declined; reminded to call for an overnight weight gain of >2 pounds or a weekly weight gain of >5 pounds - weight down 20 pounds from her last visit here 6 weeks ago - not adding salt to her food - saw cardiology Gwen Pounds) 03/10/19 - BP today may not be able to tolerate changing losartan to entresto at this time - unsure if HR can tolerate titration of metoprolol succinate - consider adding farxiga or jardiance - BNP 03/23/19 was 1520.0  2: HTN- - BP looks good although on the low side -  saw PCP Burnett Sheng) 07/12/2018 - BMP 03/28/19 reviewed and showed sodium 139, potassium 4.4, creatinine 0.78 and GFR >60  3: Lymphedema- - stage 2 - resolved - wearing compression socks daily with removal at bedtime - PT evaluation order written again   Facility medication list was reviewed.  Return in 6 months or sooner for any questions/problems before then.

## 2019-05-16 ENCOUNTER — Other Ambulatory Visit: Payer: Self-pay

## 2019-05-16 ENCOUNTER — Ambulatory Visit: Payer: Medicare Other | Attending: Family | Admitting: Family

## 2019-05-16 ENCOUNTER — Encounter: Payer: Self-pay | Admitting: Family

## 2019-05-16 VITALS — BP 112/69 | HR 63 | Resp 16 | Ht 64.0 in | Wt 128.2 lb

## 2019-05-16 DIAGNOSIS — I10 Essential (primary) hypertension: Secondary | ICD-10-CM

## 2019-05-16 DIAGNOSIS — I890 Lymphedema, not elsewhere classified: Secondary | ICD-10-CM | POA: Insufficient documentation

## 2019-05-16 DIAGNOSIS — Z87891 Personal history of nicotine dependence: Secondary | ICD-10-CM | POA: Insufficient documentation

## 2019-05-16 DIAGNOSIS — F419 Anxiety disorder, unspecified: Secondary | ICD-10-CM | POA: Insufficient documentation

## 2019-05-16 DIAGNOSIS — E785 Hyperlipidemia, unspecified: Secondary | ICD-10-CM | POA: Insufficient documentation

## 2019-05-16 DIAGNOSIS — Z7901 Long term (current) use of anticoagulants: Secondary | ICD-10-CM | POA: Diagnosis not present

## 2019-05-16 DIAGNOSIS — E7800 Pure hypercholesterolemia, unspecified: Secondary | ICD-10-CM | POA: Insufficient documentation

## 2019-05-16 DIAGNOSIS — I5022 Chronic systolic (congestive) heart failure: Secondary | ICD-10-CM | POA: Insufficient documentation

## 2019-05-16 DIAGNOSIS — Z79899 Other long term (current) drug therapy: Secondary | ICD-10-CM | POA: Insufficient documentation

## 2019-05-16 DIAGNOSIS — I110 Hypertensive heart disease with heart failure: Secondary | ICD-10-CM | POA: Diagnosis not present

## 2019-05-16 DIAGNOSIS — E079 Disorder of thyroid, unspecified: Secondary | ICD-10-CM | POA: Insufficient documentation

## 2019-05-16 DIAGNOSIS — R5383 Other fatigue: Secondary | ICD-10-CM | POA: Insufficient documentation

## 2019-05-16 DIAGNOSIS — Z8249 Family history of ischemic heart disease and other diseases of the circulatory system: Secondary | ICD-10-CM | POA: Insufficient documentation

## 2019-05-16 DIAGNOSIS — F329 Major depressive disorder, single episode, unspecified: Secondary | ICD-10-CM | POA: Diagnosis not present

## 2019-05-16 NOTE — Patient Instructions (Signed)
Continue weighing daily and call for an overnight weight gain of > 2 pounds or a weekly weight gain of >5 pounds. 

## 2019-08-17 IMAGING — CT CT HEAD W/O CM
3 series · 15 of 45 positions shown, 18 images · non-contrast
Comparison: 01/10/2017

CLINICAL DATA: Ataxia with stroke suspected.

EXAM:
CT HEAD WITHOUT CONTRAST
TECHNIQUE: Contiguous axial images were obtained from the base of the skull
through the vertex without intravenous contrast.

[Series 2: head wo · axial · 0.47mm/px · z∈[-114,+1]mm · 9 of 28 slices shown, 12 images]
[im 3/28  brain]
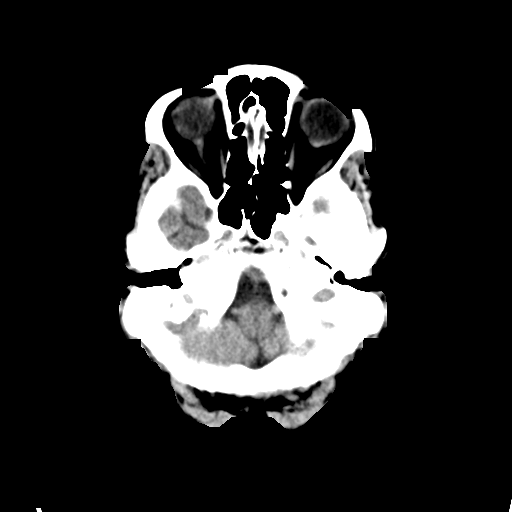
[im 3/28  bone]
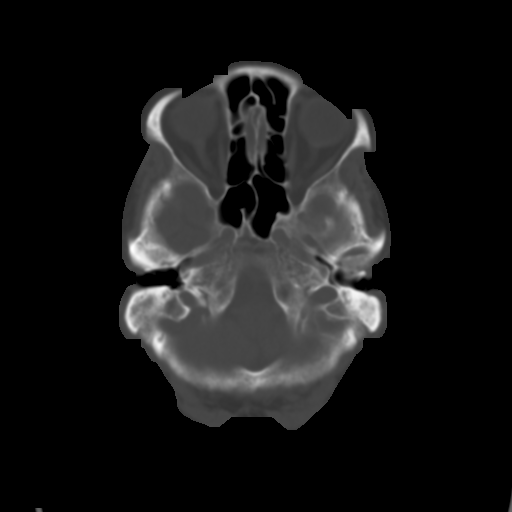
[im 6/28  brain]
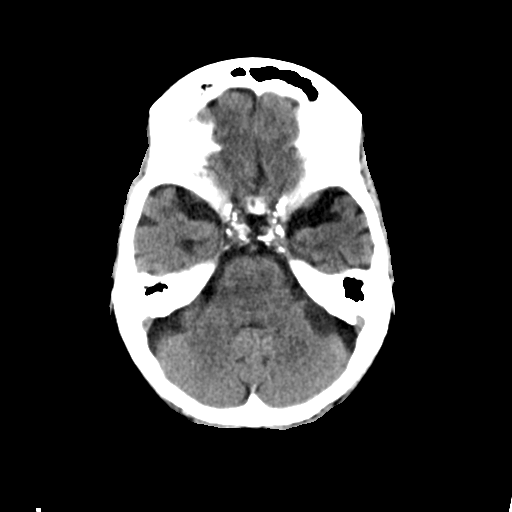
[im 9/28  brain]
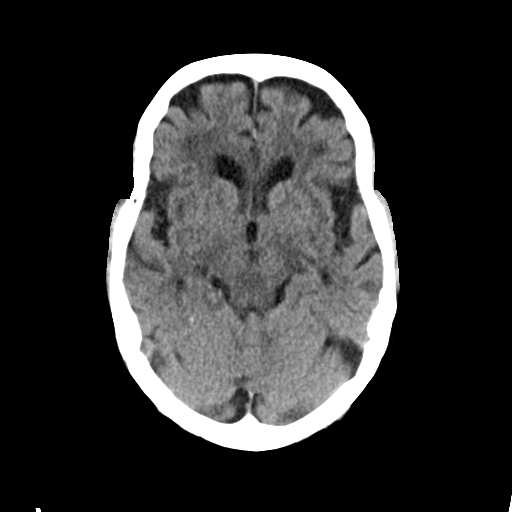
[im 12/28  brain]
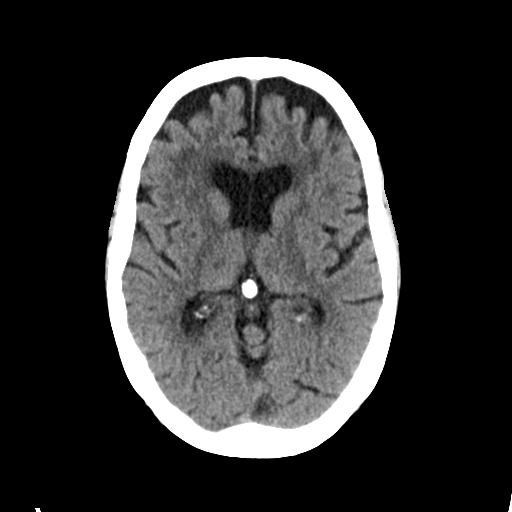
[im 15/28  brain]
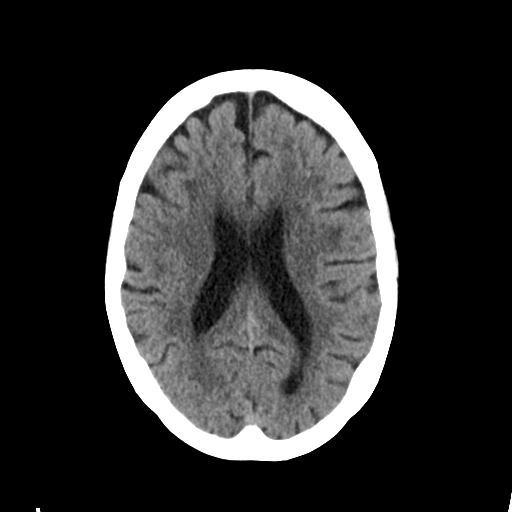
[im 15/28  bone]
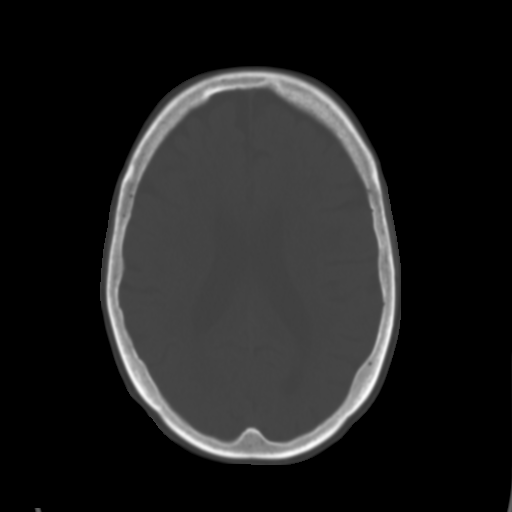
[im 17/28  brain]
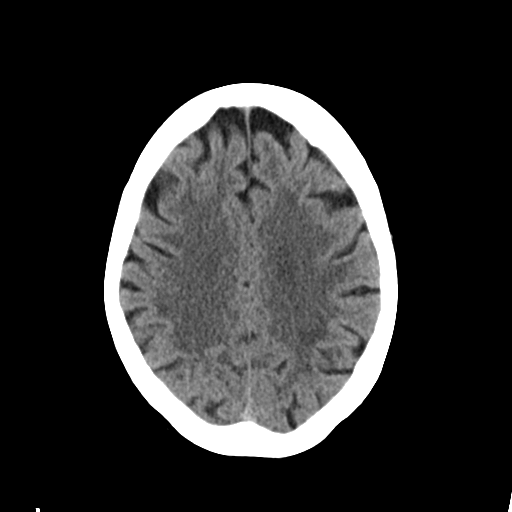
[im 20/28  brain]
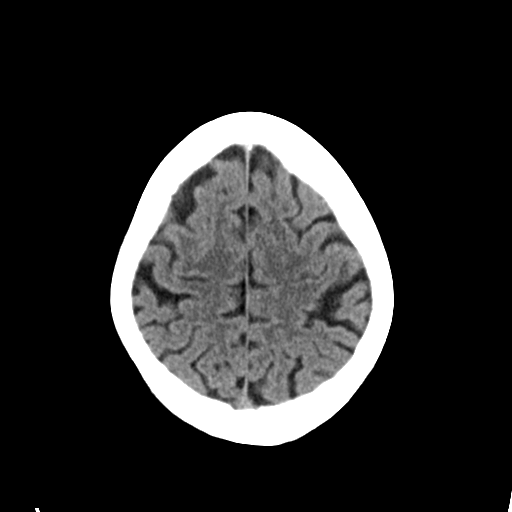
[im 23/28  brain]
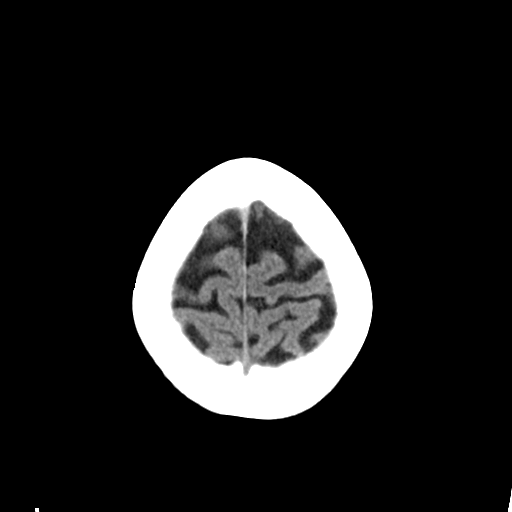
[im 26/28  brain]
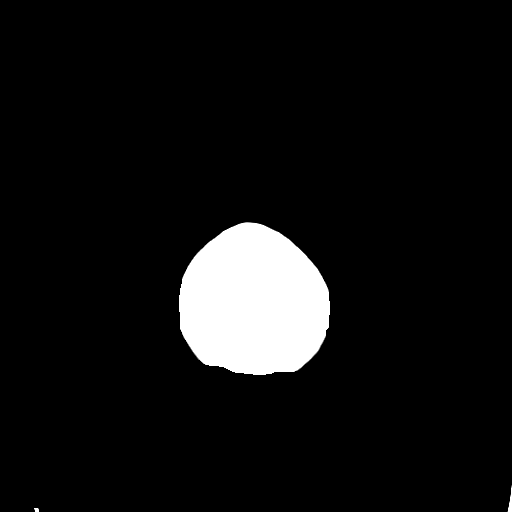
[im 26/28  bone]
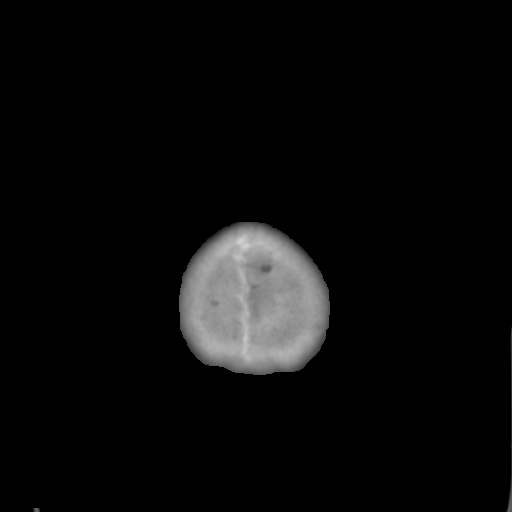

[Series 4: coronal soft tissue · coronal · 0.27mm/px · 3 of 65 slices shown]
[im 22/65  brain]
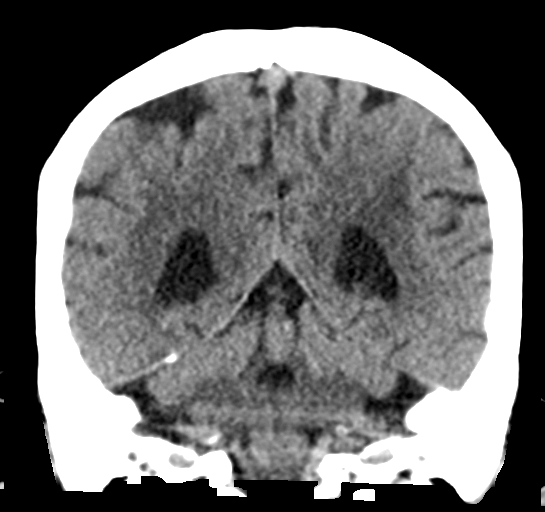
[im 29/65  brain]
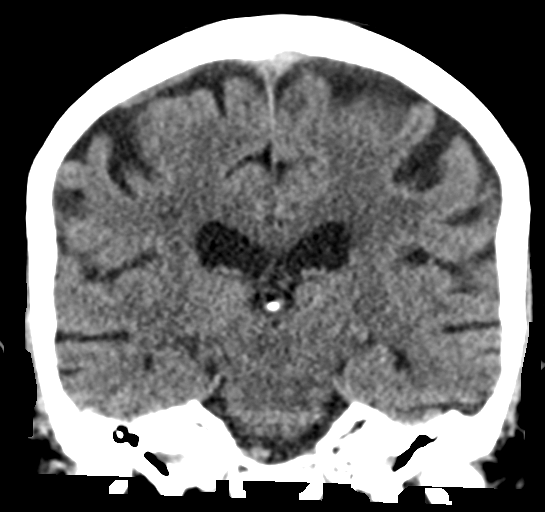
[im 36/65  brain]
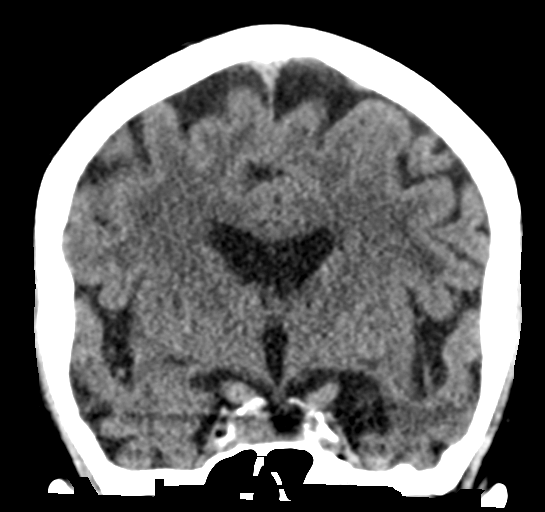

[Series 5: sagittal soft tissue · sagittal · 0.27mm/px · 3 of 50 slices shown]
[im 17/50  brain]
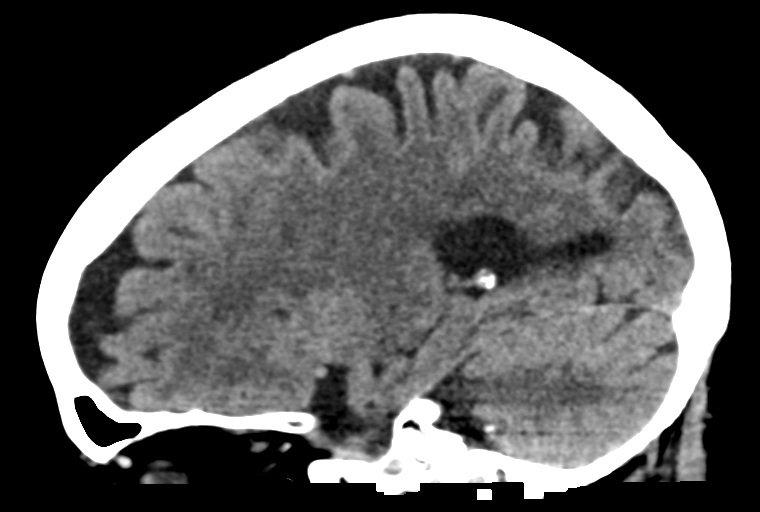
[im 25/50  brain]
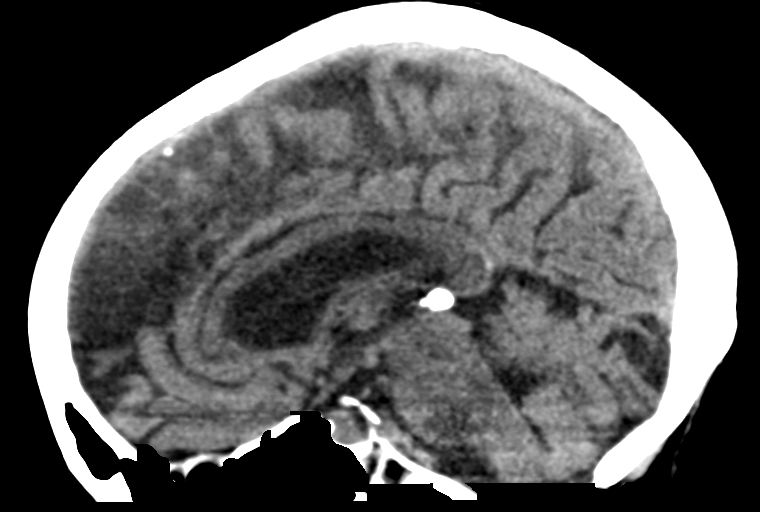
[im 33/50  brain]
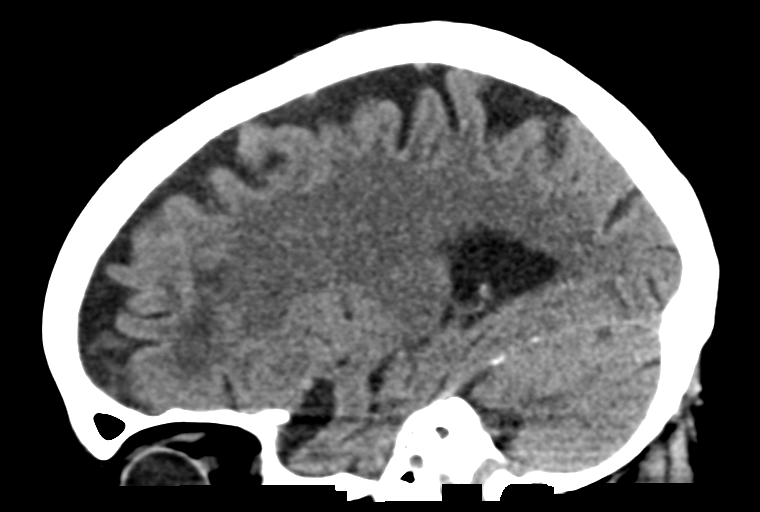

[15 of 45 positions shown; findings below may reference images not displayed]

FINDINGS: Brain: Patchy low-density in the bilateral cerebral white matter and
deep gray nuclei appears stable from prior and is attributed to
chronic ischemia.. Low-density in the central right thalamus is
better seen than on prior but was likely present previously. Age
congruent cerebral volume loss. No hemorrhage, hydrocephalus, or
masslike finding.

Vascular: Atherosclerotic calcification.  No hyperdense vessel.

Skull: Negative

Sinuses/Orbits: Negative
IMPRESSION: 1. No acute finding when compared to prior.
2. Chronic small vessel ischemia.

## 2019-11-12 NOTE — Progress Notes (Signed)
Patient ID: Deborah Martinez, female    DOB: 09-03-1927, 87 y.o.   MRN: [id number]  HPI  Deborah Martinez is a 86 y/o female with a history of atrial fibrillation, hyperlipidemia, HTN, thyroid disease, anxiety, depression, lymphedema, previous tobacco use and chronic heart failure.   Echo report from 03/23/19 reviewed and showed an EF of 25-30% along with moderate elevated PA pressure and moderate/severe MR/TR.  Has not been admitted or been in the ED in the last 6 months.   Deborah Martinez presents today for a follow-up visit with a chief complaint of minimal fatigue upon moderate exertion. Deborah Martinez describes this as chronic in nature having been present for several years. Deborah Martinez currently doesn't have any other complaints and specifically denies any difficulty sleeping, dizziness, abdominal distention, palpitations, pedal edema, chest pain, shortness of breath or cough.   Son that is present with her would like his mom to start participating in the exercise classes that they have at [facility]. He says that they are chair exercises and nothing too strenuous.    Past Medical History:  Diagnosis Date  . Anxiety   . Arrhythmia    atrial fibrillation  . CHF (congestive heart failure) (HCC)   . Constipation   . Cystocele   . Depression   . Hemorrhoid   . Hypercholesterolemia   . Hypertension   . Incomplete bladder emptying   . Lymphedema   . Postmenopausal atrophic vaginitis   . Rectocele   . Thyroid disease   . UTI (lower urinary tract infection)   . Vaginal enterocele    Past Surgical History:  Procedure Laterality Date  . APPENDECTOMY    . EYE SURGERY  2012   remve cataract  . INTRAMEDULLARY (IM) NAIL INTERTROCHANTERIC Left 11/20/2018   Procedure: INTRAMEDULLARY (IM) NAIL INTERTROCHANTRIC;  Surgeon: Christena Flake, MD;  Location: [facility] ORS;  Service: Orthopedics;  Laterality: Left;  . TOTAL ABDOMINAL HYSTERECTOMY  1977   Family History  Problem Relation Age of Onset  . Heart attack Father   . Diabetes  Brother   . Cancer Neg Hx    Social History   Tobacco Use  . Smoking status: Former Games developer  . Smokeless tobacco: Never Used  Substance Use Topics  . Alcohol use: No   No Known Allergies  Prior to Admission medications   Medication Sig Start Date End Date Taking? Authorizing Provider  acetaminophen (TYLENOL) 325 MG tablet Take 2 tablets (650 mg total) by mouth every 6 (six) hours as needed for mild pain or fever. 11/23/18  Yes Esaw Grandchild A, DO  alendronate (FOSAMAX) 70 MG tablet Take 70 mg by mouth once a week. Take with a full glass of water on an empty stomach.   Yes [provider]  buPROPion (WELLBUTRIN SR) 100 MG 12 hr tablet Take 100 mg by mouth daily. 10/14/18  Yes [provider]  calcium carbonate (TUMS - DOSED IN MG ELEMENTAL CALCIUM) 500 MG chewable tablet Chew 2 tablets (400 mg of elemental calcium total) by mouth 2 (two) times daily as needed for indigestion or heartburn. Patient taking differently: Chew 2 tablets by mouth in the morning and at bedtime.  11/23/18  Yes Esaw Grandchild A, DO  Cholecalciferol (VITAMIN D3) 1.25 MG (50000 UT) CAPS Take 50,000 Units by mouth every 30 (thirty) days.   Yes [provider]  docusate sodium (COLACE) 100 MG capsule Take 1 capsule (100 mg total) by mouth 2 (two) times daily. 11/23/18  Yes Pennie Banter,  DO  donepezil (ARICEPT) 10 MG tablet Take 10 mg by mouth at bedtime.  11/18/18  Yes [provider]  ELIQUIS 5 MG TABS tablet Take 5 mg by mouth 2 (two) times daily. 11/18/18  Yes [provider]  feeding supplement, ENSURE ENLIVE, (ENSURE ENLIVE) LIQD Take 237 mLs by mouth 2 (two) times daily between meals. 11/23/18  Yes Esaw Grandchild A, DO  ferrous sulfate 325 (65 FE) MG tablet Take 325 mg by mouth daily with breakfast.   Yes [provider]  furosemide (LASIX) 40 MG tablet Take 1 tablet (40 mg total) by mouth daily. Patient taking differently: Take 40 mg by mouth 2 (two) times  daily.  03/29/19 11/14/19 Yes Sreenath, Sudheer B, MD  levothyroxine (SYNTHROID, LEVOTHROID) 50 MCG tablet Take 50 mcg by mouth every morning.    Yes [provider]  losartan (COZAAR) 100 MG tablet Take 100 mg by mouth daily.  11/11/18  Yes [provider]  meclizine (ANTIVERT) 12.5 MG tablet Take 1 tablet (12.5 mg total) by mouth 3 (three) times daily as needed for dizziness. 02/03/17  Yes Katha Hamming, MD  metoprolol succinate (TOPROL-XL) 50 MG 24 hr tablet Take 1 tablet (50 mg total) by mouth daily. 03/28/19 11/14/19 Yes Sreenath, Jonelle Sports, MD  Omega-3 Fatty Acids (FISH OIL) 1000 MG CAPS Take by mouth daily.   Yes [provider]  phenylephrine-shark liver oil-mineral oil-petrolatum (PREPARATION H) 0.25-3-14-71.9 % rectal ointment Place 1 application rectally 4 (four) times daily as needed for hemorrhoids.   Yes [provider]  potassium chloride SA (KLOR-CON) 20 MEQ tablet Take 20 mEq by mouth daily.   Yes [provider]  senna-docusate (SENOKOT-S) 8.6-50 MG tablet Take 1 tablet by mouth at bedtime as needed for moderate constipation. 11/23/18  Yes Esaw Grandchild A, DO  sertraline (ZOLOFT) 100 MG tablet Take 100 mg by mouth daily. 08/20/18  Yes [provider]    Review of Systems  Constitutional: Positive for fatigue. Negative for appetite change.  HENT: Negative for congestion, postnasal drip and sore throat.   Eyes: Negative.   Respiratory: Negative for cough and shortness of breath.   Cardiovascular: Negative for chest pain, palpitations and leg swelling.  Gastrointestinal: Negative for abdominal distention and abdominal pain.  Endocrine: Negative.   Genitourinary: Negative.   Musculoskeletal: Negative for arthralgias and back pain.  Skin: Negative.   Allergic/Immunologic: Negative.   Neurological: Negative for dizziness and light-headedness.  Hematological: Negative for adenopathy. Does not bruise/bleed easily.   Psychiatric/Behavioral: Negative for dysphoric mood and sleep disturbance (sleeping on 1 pillow). The patient is not nervous/anxious.    Vitals:   11/14/19 1335  BP: 115/71  Pulse: 74  Resp: 16  SpO2: 98%  Weight: 142 lb 2 oz (64.5 kg)  Height: 5\' 6"  (1.676 m)   Wt Readings from Last 3 Encounters:  11/14/19 142 lb 2 oz (64.5 kg)  05/16/19 128 lb 4 oz (58.2 kg)  04/04/19 148 lb 4 oz (67.2 kg)   Lab Results  Component Value Date   CREATININE 0.78 03/28/2019   CREATININE 0.91 03/27/2019   CREATININE 0.71 03/26/2019   Physical Exam Vitals and nursing note reviewed.  Constitutional:      Appearance: Normal appearance.  HENT:     Head: Normocephalic and atraumatic.  Cardiovascular:     Rate and Rhythm: Normal rate and regular rhythm.  Pulmonary:     Effort: Pulmonary effort is normal.     Breath sounds: No wheezing or  rales.  Abdominal:     General: Abdomen is flat. There is no distension.     Palpations: Abdomen is soft.  Musculoskeletal:        General: No tenderness.     Cervical back: Normal range of motion and neck supple.     Right lower leg: No edema.     Left lower leg: No edema.  Skin:    General: Skin is warm and dry.  Neurological:     General: No focal deficit present.     Mental Status: Deborah Martinez is alert and oriented to person, place, and time.  Psychiatric:        Mood and Affect: Mood normal.        Behavior: Behavior normal.     Assessment & Plan:  1: Chronic heart failure with reduced ejection fraction- - NYHA class II - euvolemic today - getting [facility]; reminded to call for an overnight weight gain of >2 pounds or a weekly weight gain of >5 pounds - weight up 14 pounds from her last visit here 6 months ago; appetite has improved over these last 6 months - not adding salt to her food - saw cardiology Gwen Pounds) 03/10/19; son says that he's really not interested in returning at this time - BP today may not be able to tolerate changing  losartan to entresto  - consider adding farxiga or jardiance at next visit - BNP 03/23/19 was 1520.0 - order written for her to participate in exercise class twice / week to maintain her muscle strength - reports receiving her covid vaccines and her flu vaccine for this seasno  2: HTN- - BP looks good today - saw PCP Burnett Sheng) 07/12/2018 - BMP 03/28/19 reviewed and showed sodium 139, potassium 4.4, creatinine 0.78 and GFR >60   Facility medication list was reviewed.  Discussed returning PRN but son / patient would like to return again in 6 months. Can return sooner if needed for any questions/problems before then.

## 2019-11-14 ENCOUNTER — Other Ambulatory Visit: Payer: Self-pay

## 2019-11-14 ENCOUNTER — Ambulatory Visit: Payer: Medicare Other | Attending: Family | Admitting: Family

## 2019-11-14 VITALS — BP 115/71 | HR 74 | Resp 16 | Ht 66.0 in | Wt 142.1 lb

## 2019-11-14 DIAGNOSIS — I110 Hypertensive heart disease with heart failure: Secondary | ICD-10-CM | POA: Diagnosis not present

## 2019-11-14 DIAGNOSIS — Z9049 Acquired absence of other specified parts of digestive tract: Secondary | ICD-10-CM | POA: Diagnosis not present

## 2019-11-14 DIAGNOSIS — R5383 Other fatigue: Secondary | ICD-10-CM | POA: Diagnosis present

## 2019-11-14 DIAGNOSIS — E785 Hyperlipidemia, unspecified: Secondary | ICD-10-CM | POA: Insufficient documentation

## 2019-11-14 DIAGNOSIS — Z79899 Other long term (current) drug therapy: Secondary | ICD-10-CM | POA: Insufficient documentation

## 2019-11-14 DIAGNOSIS — F329 Major depressive disorder, single episode, unspecified: Secondary | ICD-10-CM | POA: Diagnosis not present

## 2019-11-14 DIAGNOSIS — Z87891 Personal history of nicotine dependence: Secondary | ICD-10-CM | POA: Insufficient documentation

## 2019-11-14 DIAGNOSIS — F419 Anxiety disorder, unspecified: Secondary | ICD-10-CM | POA: Insufficient documentation

## 2019-11-14 DIAGNOSIS — E079 Disorder of thyroid, unspecified: Secondary | ICD-10-CM | POA: Diagnosis not present

## 2019-11-14 DIAGNOSIS — Z7901 Long term (current) use of anticoagulants: Secondary | ICD-10-CM | POA: Diagnosis not present

## 2019-11-14 DIAGNOSIS — I5022 Chronic systolic (congestive) heart failure: Secondary | ICD-10-CM | POA: Insufficient documentation

## 2019-11-14 DIAGNOSIS — I10 Essential (primary) hypertension: Secondary | ICD-10-CM

## 2019-11-14 NOTE — Patient Instructions (Signed)
Continue weighing daily and call for an overnight weight gain of > 2 pounds or a weekly weight gain of >5 pounds. 

## 2019-11-15 ENCOUNTER — Encounter: Payer: Self-pay | Admitting: Family

## 2019-12-06 ENCOUNTER — Encounter: Payer: Self-pay | Admitting: Ophthalmology

## 2019-12-06 ENCOUNTER — Other Ambulatory Visit: Payer: Self-pay

## 2019-12-07 NOTE — Discharge Instructions (Signed)

## 2019-12-09 ENCOUNTER — Other Ambulatory Visit
Admission: RE | Admit: 2019-12-09 | Discharge: 2019-12-09 | Disposition: A | Discharge status: Home / Self Care | Payer: Medicare Other | Source: Ambulatory Visit | Attending: Ophthalmology | Admitting: Ophthalmology

## 2019-12-09 ENCOUNTER — Other Ambulatory Visit: Payer: Self-pay

## 2019-12-09 DIAGNOSIS — Z01818 Encounter for other preprocedural examination: Secondary | ICD-10-CM | POA: Insufficient documentation

## 2019-12-09 DIAGNOSIS — Z20822 Contact with and (suspected) exposure to covid-19: Secondary | ICD-10-CM | POA: Diagnosis not present

## 2019-12-09 LAB — SARS CORONAVIRUS 2 (TAT 6-24 HRS): SARS Coronavirus 2: NEGATIVE

## 2019-12-12 MED ORDER — SODIUM CHLORIDE 0.9 % IV SOLN: INTRAVENOUS | Status: DC

## 2019-12-12 MED ORDER — TETRACAINE HCL 0.5 % OP SOLN
1.0000 [drp] | OPHTHALMIC | Status: AC
  Administered 2019-12-13 (×2): 1 [drp] via OPHTHALMIC

## 2019-12-13 ENCOUNTER — Ambulatory Visit
Admission: RE | Admit: 2019-12-13 | Discharge: 2019-12-13 | Disposition: A | Discharge status: Home / Self Care | Payer: Medicare Other | Attending: Ophthalmology | Admitting: Ophthalmology

## 2019-12-13 ENCOUNTER — Encounter: Payer: Self-pay | Admitting: Anesthesiology

## 2019-12-13 ENCOUNTER — Encounter: Payer: Self-pay | Admitting: Ophthalmology

## 2019-12-13 ENCOUNTER — Encounter
Admission: RE | Disposition: A | Discharge status: Home / Self Care | Payer: Self-pay | Source: Home / Self Care | Attending: Ophthalmology

## 2019-12-13 ENCOUNTER — Ambulatory Visit: Admission: RE | Admit: 2019-12-13 | Payer: Medicare Other | Source: Home / Self Care | Admitting: Ophthalmology

## 2019-12-13 ENCOUNTER — Other Ambulatory Visit: Payer: Self-pay

## 2019-12-13 DIAGNOSIS — Z79890 Hormone replacement therapy (postmenopausal): Secondary | ICD-10-CM | POA: Diagnosis not present

## 2019-12-13 DIAGNOSIS — Z87891 Personal history of nicotine dependence: Secondary | ICD-10-CM | POA: Insufficient documentation

## 2019-12-13 DIAGNOSIS — Z7901 Long term (current) use of anticoagulants: Secondary | ICD-10-CM | POA: Insufficient documentation

## 2019-12-13 DIAGNOSIS — Z7983 Long term (current) use of bisphosphonates: Secondary | ICD-10-CM | POA: Diagnosis not present

## 2019-12-13 DIAGNOSIS — Z79899 Other long term (current) drug therapy: Secondary | ICD-10-CM | POA: Insufficient documentation

## 2019-12-13 DIAGNOSIS — H2512 Age-related nuclear cataract, left eye: Secondary | ICD-10-CM | POA: Insufficient documentation

## 2019-12-13 HISTORY — DX: Unspecified dementia, unspecified severity, without behavioral disturbance, psychotic disturbance, mood disturbance, and anxiety: F03.90

## 2019-12-13 HISTORY — DX: Hypothyroidism, unspecified: E03.9

## 2019-12-13 HISTORY — PX: CATARACT EXTRACTION W/PHACO: SHX586

## 2019-12-13 SURGERY — PHACOEMULSIFICATION, CATARACT, WITH IOL INSERTION
Anesthesia: Monitor Anesthesia Care | Site: Eye | Laterality: Left

## 2019-12-13 MED ORDER — ARMC OPHTHALMIC DILATING GEL
1.0000 "application " | OPHTHALMIC | Status: DC
  Administered 2019-12-13 (×3): 1 via OPHTHALMIC
  Filled 2019-12-13: qty 0.25

## 2019-12-13 MED ORDER — EPINEPHRINE PF 1 MG/ML IJ SOLN
INTRAOCULAR | Status: DC | PRN
  Administered 2019-12-13: 69 mL via OPHTHALMIC

## 2019-12-13 MED ORDER — MOXIFLOXACIN HCL 0.5 % OP SOLN
OPHTHALMIC | Status: DC | PRN
  Administered 2019-12-13: 0.2 mL via OPHTHALMIC

## 2019-12-13 MED ORDER — NA CHONDROIT SULF-NA HYALURON 40-17 MG/ML IO SOLN
INTRAOCULAR | Status: DC | PRN
  Administered 2019-12-13: 1 mL via INTRAOCULAR

## 2019-12-13 MED ORDER — LIDOCAINE HCL (PF) 2 % IJ SOLN
INTRAOCULAR | Status: DC | PRN
  Administered 2019-12-13: 2 mL

## 2019-12-13 MED ORDER — BRIMONIDINE TARTRATE-TIMOLOL 0.2-0.5 % OP SOLN
OPHTHALMIC | Status: DC | PRN
  Administered 2019-12-13: 1 [drp] via OPHTHALMIC

## 2019-12-13 SURGICAL SUPPLY — 18 items
CANNULA ANT/CHMB 27GA (MISCELLANEOUS) ×6 IMPLANT
GLOVE BIO SURGEON STRL SZ8 (GLOVE) ×3 IMPLANT
GLOVE BIOGEL M 6.5 STRL (GLOVE) ×3 IMPLANT
GLOVE SURG LX 8.0 MICRO (GLOVE) ×2
GLOVE SURG LX STRL 8.0 MICRO (GLOVE) ×1 IMPLANT
GOWN STRL REUS W/ TWL LRG LVL3 (GOWN DISPOSABLE) ×2 IMPLANT
GOWN STRL REUS W/TWL LRG LVL3 (GOWN DISPOSABLE) ×4
LENS IOL ACRSF IQ ULTRA 19.0 (Intraocular Lens) ×1 IMPLANT
LENS IOL ACRYSOF IQ 19.0 (Intraocular Lens) ×3 IMPLANT
NEEDLE FILTER BLUNT 18X 1/2SAF (NEEDLE) ×2
NEEDLE FILTER BLUNT 18X1 1/2 (NEEDLE) ×1 IMPLANT
PACK CATARACT (MISCELLANEOUS) ×3 IMPLANT
PACK CATARACT BRASINGTON LX (MISCELLANEOUS) ×3 IMPLANT
PACK EYE AFTER SURG (MISCELLANEOUS) ×3 IMPLANT
SYR 3ML LL SCALE MARK (SYRINGE) ×6 IMPLANT
SYR TB 1ML LUER SLIP (SYRINGE) ×3 IMPLANT
WATER STERILE IRR 250ML POUR (IV SOLUTION) ×3 IMPLANT
WIPE NON LINTING 3.25X3.25 (MISCELLANEOUS) ×3 IMPLANT

## 2019-12-13 NOTE — Transfer of Care (Signed)
Immediate Anesthesia Transfer of Care Note  Patient: Deborah Martinez  Procedure(s) Performed: CATARACT EXTRACTION PHACO AND INTRAOCULAR LENS PLACEMENT (IOC) LEFT 7.99 00:45.3 (Left Eye)  Patient Location: Short Stay  Anesthesia Type:MAC  Level of Consciousness: awake, alert  and oriented  Airway & Oxygen Therapy: Patient Spontanous Breathing  Post-op Assessment: Report given to RN and Post -op Vital signs reviewed and stable  Post vital signs: Reviewed  Last Vitals:  Vitals Value Taken Time  BP 169/98 12/13/19 1225  Temp 36.6 C 12/13/19 1225  Pulse 62 12/13/19 1225  Resp 17 12/13/19 1225  SpO2 98 % 12/13/19 1225    Last Pain:  Vitals:   12/13/19 1028  TempSrc: Temporal  PainSc: 0-No pain         Complications: No complications documented.

## 2019-12-13 NOTE — Op Note (Signed)
PREOPERATIVE DIAGNOSIS:  Nuclear sclerotic cataract of the left eye.   POSTOPERATIVE DIAGNOSIS:  Nuclear sclerotic cataract of the left eye.   OPERATIVE PROCEDURE:@   SURGEON:  Galen Manila, MD.   ANESTHESIA:  Anesthesiologist: Emilio Math, DO CRNA: Mathews Argyle, CRNA Student Nurse Anesthetist: Edison Pace, RN  1.      Managed anesthesia care. 2.     0.44ml of Shugarcaine was instilled following the paracentesis   COMPLICATIONS:  None.   TECHNIQUE:   Stop and chop   DESCRIPTION OF PROCEDURE:  The patient was examined and consented in the preoperative holding area where the aforementioned topical anesthesia was applied to the left eye and then brought back to the Operating Room where the left eye was prepped and draped in the usual sterile ophthalmic fashion and a lid speculum was placed. A paracentesis was created with the side port blade and the anterior chamber was filled with viscoelastic. A near clear corneal incision was performed with the steel keratome. A continuous curvilinear capsulorrhexis was performed with a cystotome followed by the capsulorrhexis forceps. Hydrodissection and hydrodelineation were carried out with BSS on a blunt cannula. The lens was removed in a stop and chop  technique and the remaining cortical material was removed with the irrigation-aspiration handpiece. The capsular bag was inflated with viscoelastic and the Technis [id number] lens was placed in the capsular bag without complication. The remaining viscoelastic was removed from the eye with the irrigation-aspiration handpiece. The wounds were hydrated. The anterior chamber was flushed with BSS and the eye was inflated to physiologic pressure. 0.68ml Vigamox was placed in the anterior chamber. The wounds were found to be water tight. The eye was dressed with Combigan. The patient was given protective glasses to wear throughout the day and a shield with which to sleep tonight. The patient was also given  drops with which to begin a drop regimen today and will follow-up with me in one day. Implant Name Type Inv. Item Serial No. Manufacturer Lot No. LRB No. Used Action  LENS IOL ACRYSOF IQ 19.0 - [id number] Intraocular Lens LENS IOL ACRYSOF IQ 19.0 [id number] ALCON  Left 1 Implanted    Procedure(s): CATARACT EXTRACTION PHACO AND INTRAOCULAR LENS PLACEMENT (IOC) LEFT 7.99 00:45.3 (Left)  Electronically signed: Galen Manila 12/13/2019 12:25 PM

## 2019-12-13 NOTE — Anesthesia Postprocedure Evaluation (Signed)
Anesthesia Post Note  Patient: Deborah Martinez  Procedure(s) Performed: CATARACT EXTRACTION PHACO AND INTRAOCULAR LENS PLACEMENT (IOC) LEFT 7.99 00:45.3 (Left Eye)  Patient location during evaluation: Short Stay Anesthesia Type: MAC Level of consciousness: awake and alert Pain management: pain level controlled Vital Signs Assessment: post-procedure vital signs reviewed and stable Respiratory status: spontaneous breathing, nonlabored ventilation, respiratory function stable and patient connected to nasal cannula oxygen Cardiovascular status: stable and blood pressure returned to baseline Postop Assessment: no apparent nausea or vomiting Anesthetic complications: no   No complications documented.   Last Vitals:  Vitals:   12/13/19 1028 12/13/19 1225  BP: 140/82 (!) 169/98  Pulse: (!) 59 66  Resp: 16 14  Temp: 36.8 C 36.6 C  SpO2: 98% 100%    Last Pain:  Vitals:   12/13/19 1225  TempSrc:   PainSc: 0-No pain                 Rolla Plate P

## 2019-12-13 NOTE — Anesthesia Preprocedure Evaluation (Addendum)
Anesthesia Evaluation  Patient identified by MRN, date of birth, ID band Patient awake    Reviewed: Allergy & Precautions, NPO status , Patient's Chart, lab work & pertinent test results  Airway Mallampati: II       Dental  (+) Teeth Intact   Pulmonary neg pulmonary ROS, former smoker,    Pulmonary exam normal breath sounds clear to auscultation       Cardiovascular hypertension, +CHF  Normal cardiovascular exam+ Valvular Problems/Murmurs MR  Rhythm:Regular Rate:Normal  EF 25-30%   Neuro/Psych PSYCHIATRIC DISORDERS Anxiety Depression Dementia negative neurological ROS     GI/Hepatic negative GI ROS, Neg liver ROS,   Endo/Other  Hypothyroidism   Renal/GU negative Renal ROS  negative genitourinary   Musculoskeletal negative musculoskeletal ROS (+)   Abdominal   Peds negative pediatric ROS (+)  Hematology negative hematology ROS (+)   Anesthesia Other Findings .Marland KitchenPast Medical History: No date: Anxiety No date: Arrhythmia     Comment:  atrial fibrillation No date: CHF (congestive heart failure) (HCC) No date: Constipation No date: Cystocele No date: Dementia ([place]) No date: Depression No date: Hemorrhoid No date: Hypercholesterolemia No date: Hypertension No date: Hypothyroidism No date: Incomplete bladder emptying No date: Lymphedema No date: Postmenopausal atrophic vaginitis No date: Rectocele No date: Thyroid disease No date: UTI (lower urinary tract infection) No date: Vaginal enterocele   Reproductive/Obstetrics negative OB ROS                            Anesthesia Physical Anesthesia Plan  ASA: IV  Anesthesia Plan: MAC   Post-op Pain Management:    Induction:   PONV Risk Score and Plan: 2  Airway Management Planned: Nasal Cannula  Additional Equipment:   Intra-op Plan:   Post-operative Plan:   Informed Consent: I have reviewed the patients History and  Physical, chart, labs and discussed the procedure including the risks, benefits and alternatives for the proposed anesthesia with the patient or authorized representative who has indicated his/her understanding and acceptance.       Plan Discussed with: CRNA, Anesthesiologist and Surgeon  Anesthesia Plan Comments:         Anesthesia Quick Evaluation

## 2019-12-13 NOTE — H&P (Signed)
[facility]   Primary Care Physician:  [name], MD Ophthalmologist: Dr. Druscilla Brownie  Pre-Procedure History & Physical: HPI:  Deborah Martinez is a 86 y.o. female here for cataract surgery.   Past Medical History:  Diagnosis Date   Anxiety    Arrhythmia    atrial fibrillation   CHF (congestive heart failure) (HCC)    Constipation    Cystocele    Dementia (HCC)    Depression    Hemorrhoid    Hypercholesterolemia    Hypertension    Hypothyroidism    Incomplete bladder emptying    Lymphedema    Postmenopausal atrophic vaginitis    Rectocele    Thyroid disease    UTI (lower urinary tract infection)    Vaginal enterocele     Past Surgical History:  Procedure Laterality Date   APPENDECTOMY     EYE SURGERY  2012   remve cataract   INTRAMEDULLARY (IM) NAIL INTERTROCHANTERIC Left 11/20/2018   Procedure: INTRAMEDULLARY (IM) NAIL INTERTROCHANTRIC;  Surgeon: Christena Flake, MD;  Location: [facility] ORS;  Service: Orthopedics;  Laterality: Left;   TOTAL ABDOMINAL HYSTERECTOMY  1977    Prior to Admission medications   Medication Sig Start Date End Date Taking? Authorizing Provider  acetaminophen (TYLENOL) 325 MG tablet Take 2 tablets (650 mg total) by mouth every 6 (six) hours as needed for mild pain or fever. 11/23/18  Yes Esaw Grandchild A, DO  Amino Acids-Protein Hydrolys (FEEDING SUPPLEMENT, PRO-STAT SUGAR FREE 64,) LIQD Take 30 mLs by mouth daily.   Yes [provider]  buPROPion (WELLBUTRIN SR) 100 MG 12 hr tablet Take 100 mg by mouth daily. 10/14/18  Yes [provider]  calcium carbonate (TUMS - DOSED IN MG ELEMENTAL CALCIUM) 500 MG chewable tablet Chew 2 tablets (400 mg of elemental calcium total) by mouth 2 (two) times daily as needed for indigestion or heartburn. 11/23/18  Yes Esaw Grandchild A, DO  docusate sodium (COLACE) 100 MG capsule Take 1 capsule (100 mg total) by mouth 2 (two) times daily. 11/23/18  Yes Esaw Grandchild A, DO   donepezil (ARICEPT) 10 MG tablet Take 10 mg by mouth at bedtime.  11/18/18  Yes [provider]  ELIQUIS 5 MG TABS tablet Take 5 mg by mouth 2 (two) times daily. 11/18/18  Yes [provider]  ergocalciferol (VITAMIN D2) 1.25 MG (50000 UT) capsule Take 5,000 Units by mouth every 30 (thirty) days.   Yes [provider]  feeding supplement, ENSURE ENLIVE, (ENSURE ENLIVE) LIQD Take 237 mLs by mouth 2 (two) times daily between meals. Patient taking differently: Take 237 mLs by mouth 2 (two) times daily between meals. STRAWBERRY ONLY 11/23/18  Yes Esaw Grandchild A, DO  ferrous sulfate 325 (65 FE) MG tablet Take 325 mg by mouth daily with breakfast.   Yes [provider]  furosemide (LASIX) 40 MG tablet Take 1 tablet (40 mg total) by mouth daily. Patient taking differently: Take 40 mg by mouth 2 (two) times daily. (0800 & 1400) 03/29/19 12/11/20 Yes Sreenath, Sudheer B, MD  levothyroxine (SYNTHROID, LEVOTHROID) 50 MCG tablet Take 50 mcg by mouth daily.    Yes [provider]  losartan (COZAAR) 100 MG tablet Take 100 mg by mouth daily.  11/11/18  Yes [provider]  meclizine (ANTIVERT) 12.5 MG tablet Take 1 tablet (12.5 mg total) by mouth 3 (three) times daily as needed for dizziness. 02/03/17  Yes Katha Hamming, MD  metoprolol succinate (TOPROL-XL) 50 MG 24 hr  tablet Take 1 tablet (50 mg total) by mouth daily. 03/28/19 12/11/20 Yes Sreenath, Jonelle Sports, MD  Omega-3 Fatty Acids (FISH OIL) 1000 MG CAPS Take 1,000 mg by mouth daily.    Yes [provider]  phenylephrine-shark liver oil-mineral oil-petrolatum (PREPARATION H) 0.25-3-14-71.9 % rectal ointment Place 1 application rectally 4 (four) times daily as needed for hemorrhoids.   Yes [provider]  potassium chloride SA (KLOR-CON) 20 MEQ tablet Take 20 mEq by mouth daily.   Yes [provider]  senna-docusate (SENOKOT-S) 8.6-50 MG tablet Take 1 tablet by mouth at  bedtime as needed for moderate constipation. 11/23/18  Yes Esaw Grandchild A, DO  sertraline (ZOLOFT) 100 MG tablet Take 100 mg by mouth daily. 08/20/18  Yes [provider]  alendronate (FOSAMAX) 70 MG tablet Take 70 mg by mouth once a week. Take with a full glass of water on an empty stomach.    [provider]    Allergies as of 10/25/2019   (No Known Allergies)    Family History  Problem Relation Age of Onset   Heart attack Father    Diabetes Brother    Cancer Neg Hx     Social History   Socioeconomic History   Marital status: Married    Spouse name: Not on file   Number of children: Not on file   Years of education: Not on file   Highest education level: Not on file  Occupational History   Not on file  Tobacco Use   Smoking status: Former Smoker    Types: Cigarettes    Quit date: 1970    Years since quitting: 51.9   Smokeless tobacco: Never Used  Building services engineer Use: Never used  Substance and Sexual Activity   Alcohol use: No   Drug use: No   Sexual activity: Not Currently  Other Topics Concern   Not on file  Social History Narrative   Not on file   Social Determinants of Health   Financial Resource Strain:    Difficulty of Paying Living Expenses: Not on file  Food Insecurity:    Worried About Programme researcher, broadcasting/film/video in the Last Year: Not on file   [employer] of Food in the Last Year: Not on file  Transportation Needs:    Lack of Transportation (Medical): Not on file   Lack of Transportation (Non-Medical): Not on file  Physical Activity:    Days of Exercise per Week: Not on file   Minutes of Exercise per Session: Not on file  Stress:    Feeling of Stress : Not on file  Social Connections:    Frequency of Communication with Friends and Family: Not on file   Frequency of Social Gatherings with Friends and Family: Not on file   Attends Religious Services: Not on file   Active Member of Clubs or Organizations:  Not on file   Attends Banker Meetings: Not on file   Marital Status: Not on file  Intimate Partner Violence:    Fear of Current or Ex-Partner: Not on file   Emotionally Abused: Not on file   Physically Abused: Not on file   Sexually Abused: Not on file    Review of Systems: See HPI, otherwise negative ROS  Physical Exam: BP 140/82    Pulse (!) 59    Temp 98.2 F (36.8 C) (Temporal)    Resp 16    Ht 5\' 6"  (1.676 m)    Wt  59 kg    SpO2 98%    BMI 20.98 kg/m  General:   Alert,  pleasant and cooperative in NAD Head:  Normocephalic and atraumatic. Respiratory:  Normal work of breathing. Heart:  Regular rate and rhythm.  Impression/Plan: Deborah Martinez is here for cataract surgery.  Risks, benefits, limitations, and alternatives regarding cataract surgery have been reviewed with the patient.  Questions have been answered.  All parties agreeable.   Galen Manila, MD  12/13/2019, 11:56 AM

## 2020-01-17 IMAGING — US US CAROTID DUPLEX BILAT
1 series · 13 of 24 positions shown · non-contrast
Comparison: MRI 02/01/2017

CLINICAL DATA: Presented with dizziness.

EXAM:
BILATERAL CAROTID DUPLEX ULTRASOUND
TECHNIQUE: Gray scale imaging, color Doppler and duplex ultrasound were
performed of bilateral carotid and vertebral arteries in the neck.

[Series 1: us carotid duplex bilat · 13 of 64 slices shown]
[im 1/64]
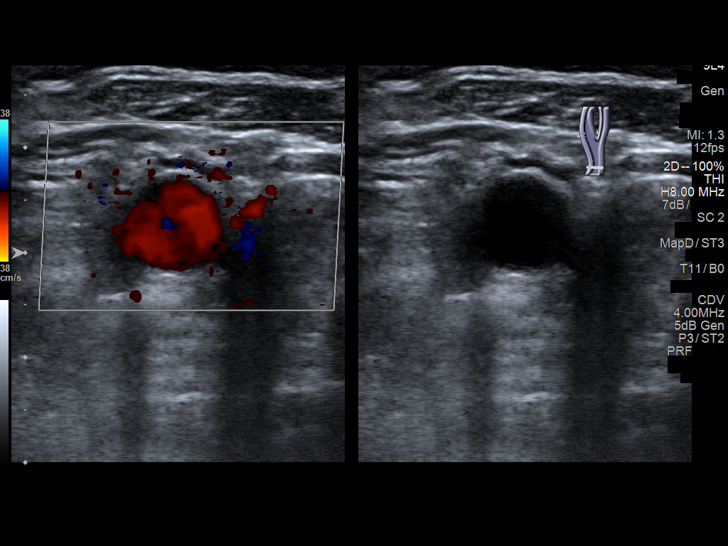
[im 6/64]
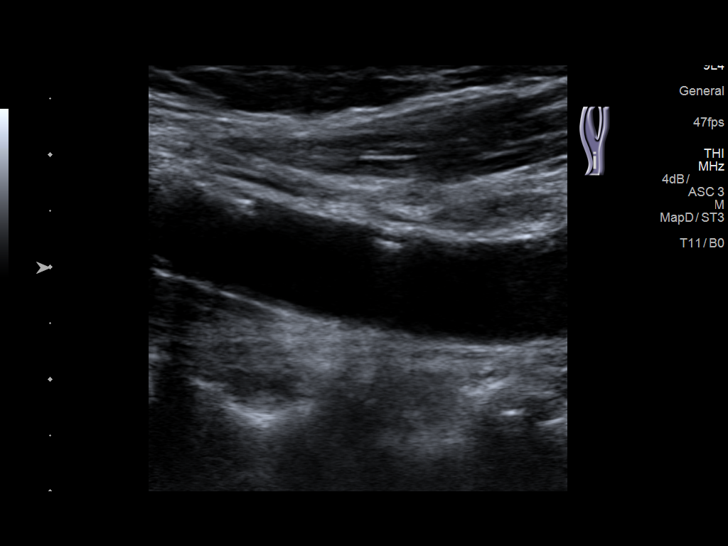
[im 11/64]
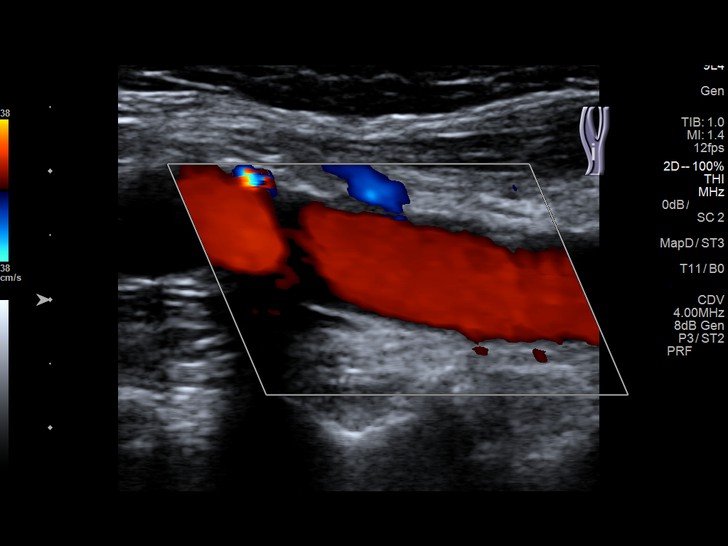
[im 17/64]
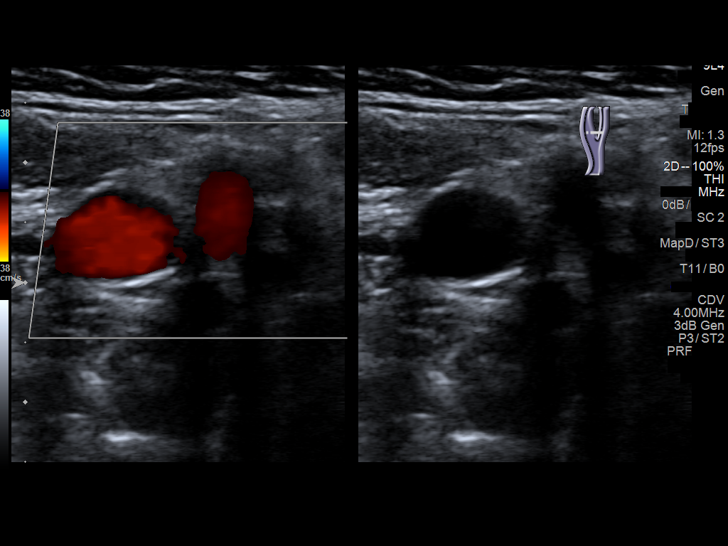
[im 22/64]
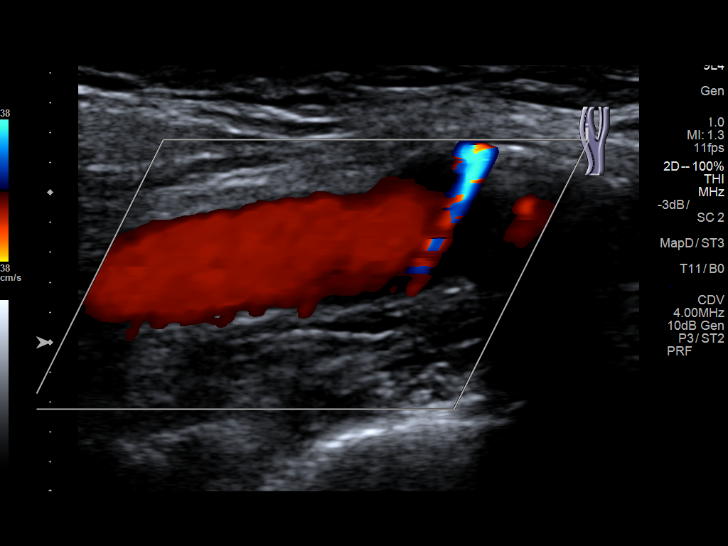
[im 28/64]
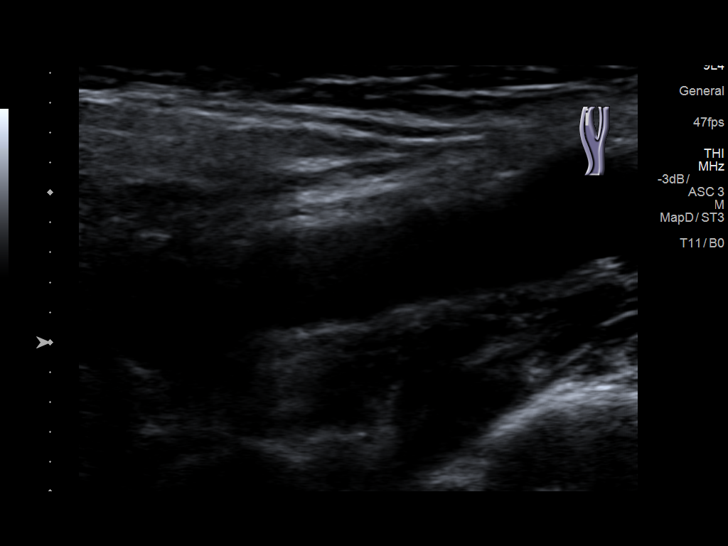
[im 33/64]
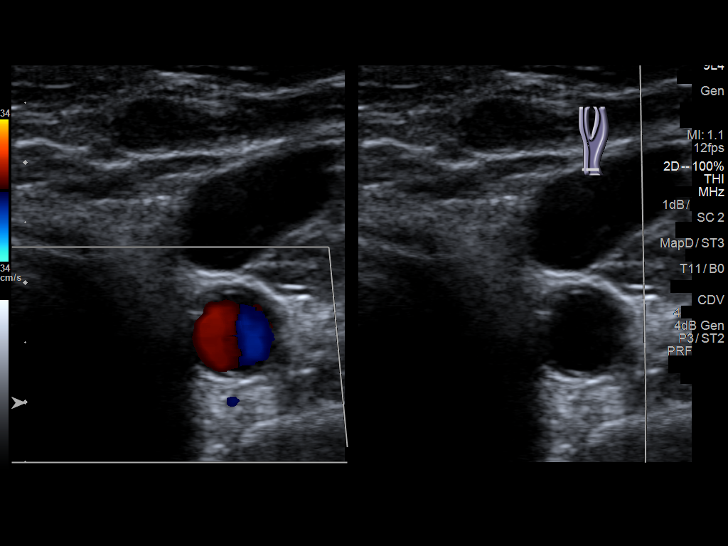
[im 36/64]
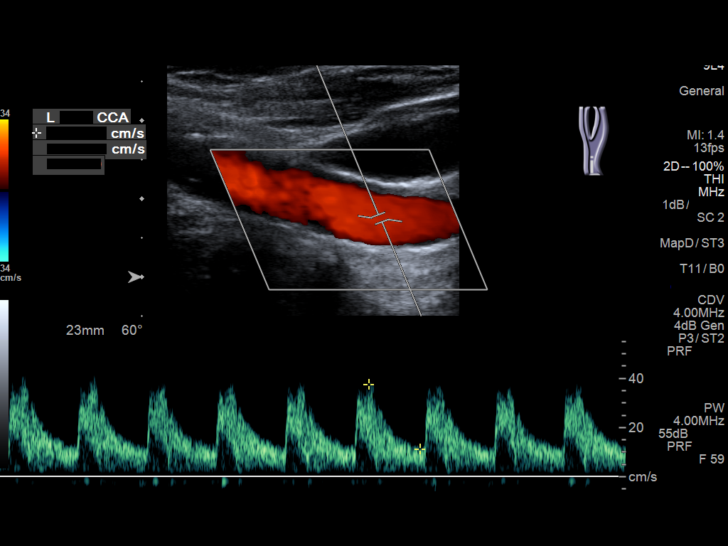
[im 42/64]
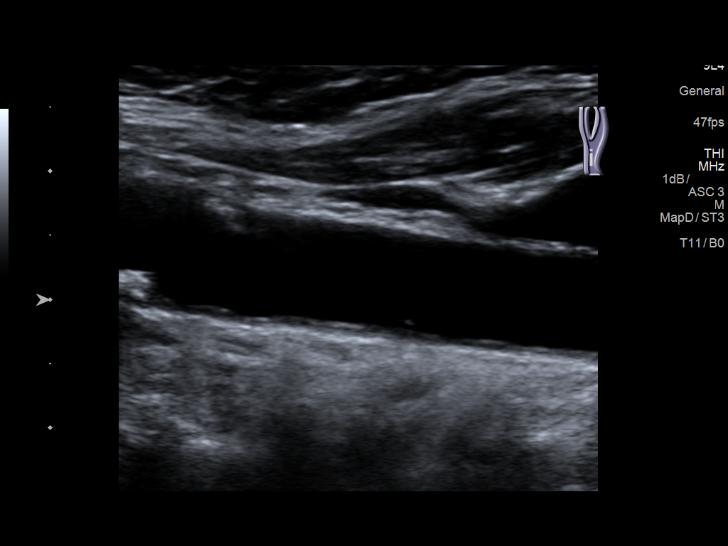
[im 47/64]
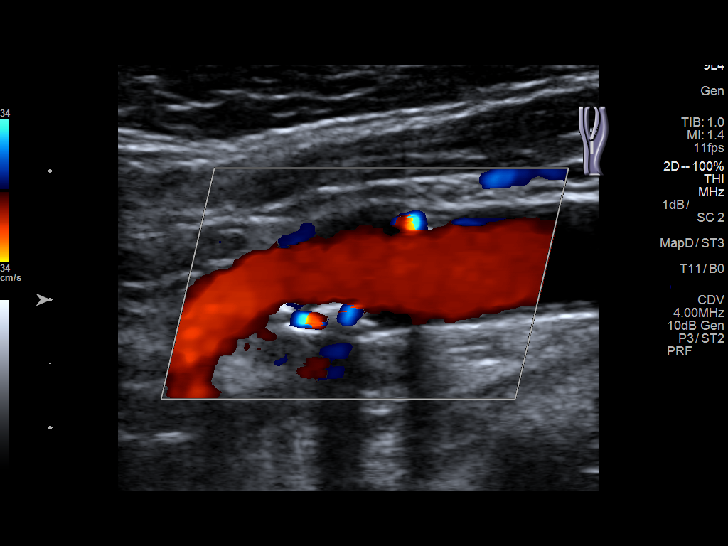
[im 53/64]
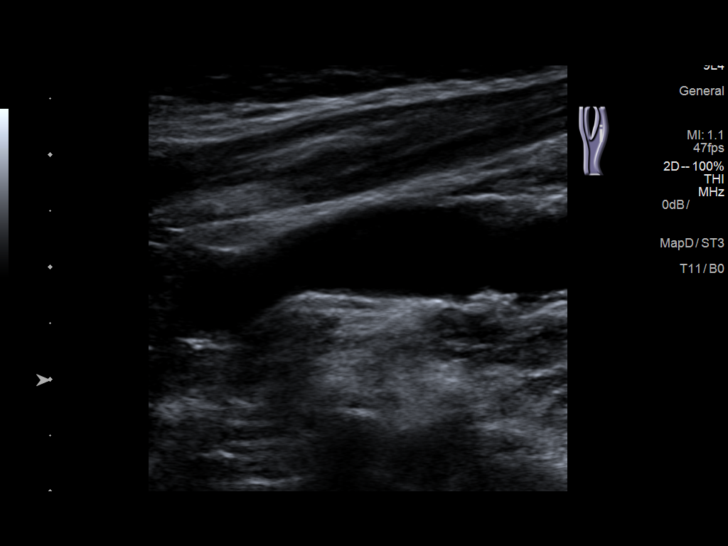
[im 58/64]
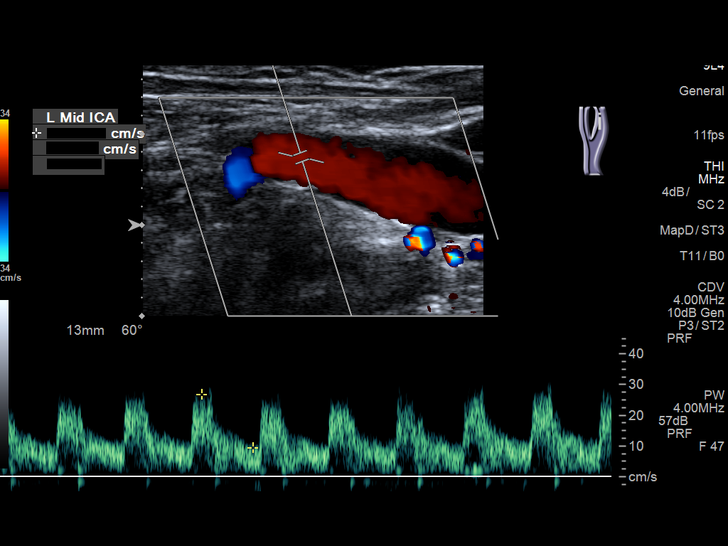
[im 64/64]
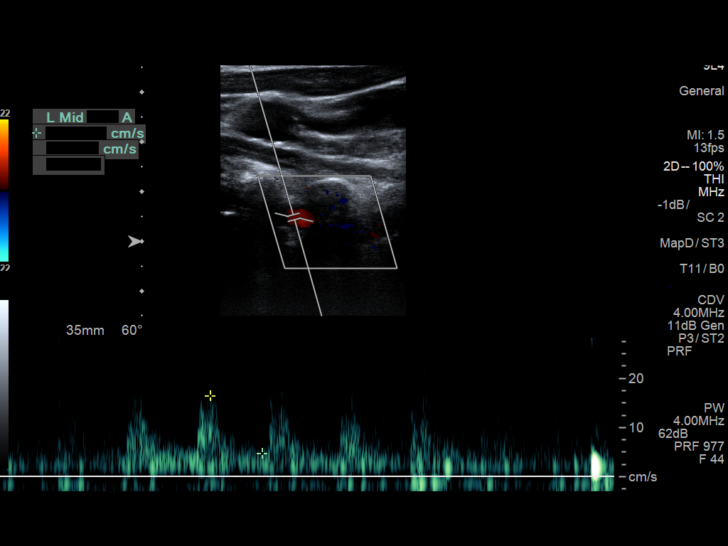

[13 of 24 positions shown; findings below may reference images not displayed]

FINDINGS: Criteria: Quantification of carotid stenosis is based on velocity
parameters that correlate the residual internal carotid diameter
with NASCET-based stenosis levels, using the diameter of the distal
internal carotid lumen as the denominator for stenosis measurement.

The following velocity measurements were obtained:

RIGHT

ICA:  39 cm/sec

CCA:  39 cm/sec

SYSTOLIC ICA/CCA RATIO:

DIASTOLIC ICA/CCA RATIO:

ECA:  47 cm/sec

LEFT

ICA:  68 cm/sec

CCA:  41 cm/sec

SYSTOLIC ICA/CCA RATIO:

DIASTOLIC ICA/CCA RATIO:

ECA:  44 cm/sec

RIGHT CAROTID ARTERY: Eccentric plaque in the right common carotid
artery. Small amount of plaque at the right carotid bulb. External
carotid artery is patent with normal waveform. Normal waveforms and
velocities in the internal carotid artery.

RIGHT VERTEBRAL ARTERY: Antegrade flow and normal waveform in the
right vertebral artery.

LEFT CAROTID ARTERY: Echogenic plaque at the left carotid bulb.
Echogenic plaque at the origin of the external carotid artery.
External carotid artery is patent with normal waveform. Normal
waveforms and velocities in the internal carotid artery.

LEFT VERTEBRAL ARTERY: Antegrade flow and normal waveform in the
left vertebral artery.
IMPRESSION: Mild atherosclerotic disease in the bilateral carotid arteries.
Estimated degree of stenosis in the internal carotid arteries is
less than 50% bilaterally.

Patent vertebral arteries with antegrade flow.

## 2020-04-25 ENCOUNTER — Encounter: Payer: Self-pay | Admitting: Emergency Medicine

## 2020-04-25 ENCOUNTER — Other Ambulatory Visit: Payer: Self-pay

## 2020-04-25 ENCOUNTER — Inpatient Hospital Stay
Admission: EM | Admit: 2020-04-25 | Discharge: 2020-04-27 | DRG: 683 | Disposition: A | Discharge status: Home / Self Care | Payer: Medicare Other | Attending: Hospitalist | Admitting: Hospitalist

## 2020-04-25 DIAGNOSIS — F32A Depression, unspecified: Secondary | ICD-10-CM | POA: Diagnosis present

## 2020-04-25 DIAGNOSIS — E861 Hypovolemia: Secondary | ICD-10-CM | POA: Diagnosis present

## 2020-04-25 DIAGNOSIS — E039 Hypothyroidism, unspecified: Secondary | ICD-10-CM | POA: Diagnosis present

## 2020-04-25 DIAGNOSIS — R339 Retention of urine, unspecified: Secondary | ICD-10-CM | POA: Diagnosis present

## 2020-04-25 DIAGNOSIS — Z833 Family history of diabetes mellitus: Secondary | ICD-10-CM | POA: Diagnosis not present

## 2020-04-25 DIAGNOSIS — I5022 Chronic systolic (congestive) heart failure: Secondary | ICD-10-CM | POA: Diagnosis present

## 2020-04-25 DIAGNOSIS — Z79890 Hormone replacement therapy (postmenopausal): Secondary | ICD-10-CM | POA: Diagnosis not present

## 2020-04-25 DIAGNOSIS — I480 Paroxysmal atrial fibrillation: Secondary | ICD-10-CM

## 2020-04-25 DIAGNOSIS — N179 Acute kidney failure, unspecified: Secondary | ICD-10-CM | POA: Diagnosis not present

## 2020-04-25 DIAGNOSIS — Z79899 Other long term (current) drug therapy: Secondary | ICD-10-CM

## 2020-04-25 DIAGNOSIS — K649 Unspecified hemorrhoids: Secondary | ICD-10-CM | POA: Diagnosis present

## 2020-04-25 DIAGNOSIS — I4821 Permanent atrial fibrillation: Secondary | ICD-10-CM | POA: Diagnosis present

## 2020-04-25 DIAGNOSIS — Z87891 Personal history of nicotine dependence: Secondary | ICD-10-CM | POA: Diagnosis not present

## 2020-04-25 DIAGNOSIS — K5900 Constipation, unspecified: Secondary | ICD-10-CM | POA: Diagnosis present

## 2020-04-25 DIAGNOSIS — Z7901 Long term (current) use of anticoagulants: Secondary | ICD-10-CM | POA: Diagnosis not present

## 2020-04-25 DIAGNOSIS — Z20822 Contact with and (suspected) exposure to covid-19: Secondary | ICD-10-CM | POA: Diagnosis present

## 2020-04-25 DIAGNOSIS — E46 Unspecified protein-calorie malnutrition: Secondary | ICD-10-CM

## 2020-04-25 DIAGNOSIS — N390 Urinary tract infection, site not specified: Secondary | ICD-10-CM | POA: Diagnosis present

## 2020-04-25 DIAGNOSIS — E860 Dehydration: Secondary | ICD-10-CM

## 2020-04-25 DIAGNOSIS — Z8249 Family history of ischemic heart disease and other diseases of the circulatory system: Secondary | ICD-10-CM | POA: Diagnosis not present

## 2020-04-25 DIAGNOSIS — F419 Anxiety disorder, unspecified: Secondary | ICD-10-CM | POA: Diagnosis present

## 2020-04-25 DIAGNOSIS — I110 Hypertensive heart disease with heart failure: Secondary | ICD-10-CM | POA: Diagnosis present

## 2020-04-25 DIAGNOSIS — N3000 Acute cystitis without hematuria: Secondary | ICD-10-CM

## 2020-04-25 DIAGNOSIS — D509 Iron deficiency anemia, unspecified: Secondary | ICD-10-CM | POA: Diagnosis present

## 2020-04-25 DIAGNOSIS — Z87440 Personal history of urinary (tract) infections: Secondary | ICD-10-CM | POA: Diagnosis not present

## 2020-04-25 DIAGNOSIS — Z7983 Long term (current) use of bisphosphonates: Secondary | ICD-10-CM | POA: Diagnosis not present

## 2020-04-25 DIAGNOSIS — F0390 Unspecified dementia without behavioral disturbance: Secondary | ICD-10-CM

## 2020-04-25 DIAGNOSIS — E785 Hyperlipidemia, unspecified: Secondary | ICD-10-CM | POA: Diagnosis present

## 2020-04-25 DIAGNOSIS — N3090 Cystitis, unspecified without hematuria: Secondary | ICD-10-CM

## 2020-04-25 LAB — CBC
HCT: 33.7 % — ABNORMAL LOW (ref 36.0–46.0)
HCT: 34.5 % — ABNORMAL LOW (ref 36.0–46.0)
Hemoglobin: 11.2 g/dL — ABNORMAL LOW (ref 12.0–15.0)
Hemoglobin: 11.3 g/dL — ABNORMAL LOW (ref 12.0–15.0)
MCH: 30 pg (ref 26.0–34.0)
MCH: 29.9 pg (ref 26.0–34.0)
MCHC: 33.2 g/dL (ref 30.0–36.0)
MCHC: 32.8 g/dL (ref 30.0–36.0)
MCV: 90.3 fL (ref 80.0–100.0)
MCV: 91.3 fL (ref 80.0–100.0)
Platelets: 216 10*3/uL (ref 150–400)
Platelets: 233 10*3/uL (ref 150–400)
RBC: 3.73 MIL/uL — ABNORMAL LOW (ref 3.87–5.11)
RBC: 3.78 MIL/uL — ABNORMAL LOW (ref 3.87–5.11)
RDW: 15.3 % (ref 11.5–15.5)
RDW: 15.4 % (ref 11.5–15.5)
WBC: 5.3 10*3/uL (ref 4.0–10.5)
WBC: 8.8 10*3/uL (ref 4.0–10.5)
nRBC: 0 % (ref 0.0–0.2)
nRBC: 0 % (ref 0.0–0.2)

## 2020-04-25 LAB — URINALYSIS, COMPLETE (UACMP) WITH MICROSCOPIC
Bacteria, UA: NONE SEEN
Bilirubin Urine: NEGATIVE
Glucose, UA: NEGATIVE mg/dL
Hgb urine dipstick: NEGATIVE
Ketones, ur: NEGATIVE mg/dL
Nitrite: NEGATIVE
Protein, ur: NEGATIVE mg/dL
Specific Gravity, Urine: 1.017 (ref 1.005–1.030)
pH: 5 (ref 5.0–8.0)

## 2020-04-25 LAB — BASIC METABOLIC PANEL
Anion gap: 10 (ref 5–15)
BUN: 27 mg/dL — ABNORMAL HIGH (ref 8–23)
CO2: 23 mmol/L (ref 22–32)
Calcium: 8.5 mg/dL — ABNORMAL LOW (ref 8.9–10.3)
Chloride: 103 mmol/L (ref 98–111)
Creatinine, Ser: 1.3 mg/dL — ABNORMAL HIGH (ref 0.44–1.00)
GFR, Estimated: 39 mL/min — ABNORMAL LOW (ref >60)
Glucose, Bld: 106 mg/dL — ABNORMAL HIGH (ref 70–99)
Potassium: 4.3 mmol/L (ref 3.5–5.1)
Sodium: 136 mmol/L (ref 135–145)

## 2020-04-25 LAB — RESP PANEL BY RT-PCR (FLU A&B, COVID) ARPGX2
Influenza A by PCR: NEGATIVE
Influenza B by PCR: NEGATIVE
SARS Coronavirus 2 by RT PCR: NEGATIVE

## 2020-04-25 LAB — CREATININE, SERUM
Creatinine, Ser: 1.19 mg/dL — ABNORMAL HIGH (ref 0.44–1.00)
GFR, Estimated: 43 mL/min — ABNORMAL LOW (ref >60)

## 2020-04-25 LAB — GLUCOSE, CAPILLARY: Glucose-Capillary: 131 mg/dL — ABNORMAL HIGH (ref 70–99)

## 2020-04-25 MED ORDER — SERTRALINE HCL 50 MG PO TABS
100.0000 mg | ORAL_TABLET | Freq: Every day | ORAL | Status: DC
  Administered 2020-04-25 – 2020-04-27 (×3): 100 mg via ORAL
  Filled 2020-04-25 (×3): qty 2

## 2020-04-25 MED ORDER — METOPROLOL SUCCINATE ER 50 MG PO TB24
50.0000 mg | ORAL_TABLET | Freq: Every day | ORAL | Status: DC
  Administered 2020-04-25 – 2020-04-27 (×3): 50 mg via ORAL
  Filled 2020-04-25 (×3): qty 1

## 2020-04-25 MED ORDER — SODIUM CHLORIDE 0.9 % IV BOLUS
1000.0000 mL | Freq: Once | INTRAVENOUS | Status: AC
  Administered 2020-04-25: 1000 mL via INTRAVENOUS

## 2020-04-25 MED ORDER — SODIUM CHLORIDE 0.9 % IV BOLUS: 1000.0000 mL | Freq: Once | INTRAVENOUS | Status: DC

## 2020-04-25 MED ORDER — BUPROPION HCL ER (SR) 100 MG PO TB12
100.0000 mg | ORAL_TABLET | Freq: Every day | ORAL | Status: DC
  Administered 2020-04-26 – 2020-04-27 (×2): 100 mg via ORAL
  Filled 2020-04-25 (×3): qty 1

## 2020-04-25 MED ORDER — SODIUM CHLORIDE 0.9 % IV SOLN: INTRAVENOUS | Status: DC

## 2020-04-25 MED ORDER — APIXABAN 5 MG PO TABS
5.0000 mg | ORAL_TABLET | Freq: Two times a day (BID) | ORAL | Status: DC
  Administered 2020-04-25: 5 mg via ORAL
  Filled 2020-04-25: qty 1

## 2020-04-25 MED ORDER — SODIUM CHLORIDE 0.9 % IV SOLN
1.0000 g | INTRAVENOUS | Status: DC
  Filled 2020-04-25: qty 10

## 2020-04-25 MED ORDER — ENOXAPARIN SODIUM 30 MG/0.3ML ~~LOC~~ SOLN
30.0000 mg | SUBCUTANEOUS | Status: DC
  Filled 2020-04-25: qty 0.3

## 2020-04-25 MED ORDER — DONEPEZIL HCL 5 MG PO TABS
10.0000 mg | ORAL_TABLET | Freq: Every day | ORAL | Status: DC
  Administered 2020-04-25 – 2020-04-26 (×2): 10 mg via ORAL
  Filled 2020-04-25 (×2): qty 2

## 2020-04-25 MED ORDER — METOPROLOL TARTRATE 5 MG/5ML IV SOLN
INTRAVENOUS | Status: AC
  Filled 2020-04-25: qty 5

## 2020-04-25 MED ORDER — SODIUM CHLORIDE 0.9 % IV SOLN
1.0000 g | Freq: Once | INTRAVENOUS | Status: AC
  Administered 2020-04-25: 1 g via INTRAVENOUS
  Filled 2020-04-25: qty 10

## 2020-04-25 MED ORDER — METOPROLOL TARTRATE 5 MG/5ML IV SOLN
2.5000 mg | INTRAVENOUS | Status: DC | PRN
  Administered 2020-04-25 (×2): 2.5 mg via INTRAVENOUS
  Filled 2020-04-25: qty 5

## 2020-04-25 MED ORDER — LEVOTHYROXINE SODIUM 50 MCG PO TABS
50.0000 ug | ORAL_TABLET | Freq: Every day | ORAL | Status: DC
  Administered 2020-04-26 – 2020-04-27 (×2): 50 ug via ORAL
  Filled 2020-04-25 (×2): qty 1

## 2020-04-25 NOTE — ED Notes (Signed)
Resumed care from [facility] rn.  Pt sleeping  nsr on monitor.  meds infusing.  siderails up x 2.

## 2020-04-25 NOTE — ED Notes (Signed)
meds given for blood pressure.  Pt alert. nsr on monitor.

## 2020-04-25 NOTE — ED Triage Notes (Signed)
Pt comes into the ED via [facility] from [facility] c/o dizziness, not wanting to eat or drink.  Per EMS, strong odor or UTI present on patient.  Pt at baseline eats, drinks, and is ambulatory with walker.  Pt in NAD with even and unlabored respirations.  VSS with EMS, CBG 170. Pt afebrile and unremarkable EKG.

## 2020-04-25 NOTE — H&P (Signed)
History and Physical  Deborah LenzBetty W Martinez ZOX:[id number] DOB: October 06, 1927 DOA: 04/25/2020  Referring physician: Dr. Scotty CourtStafford, EDP  PCP: Jerl MinaHedrick, James, MD  Outpatient Specialists: None Patient coming from: ALF memory care.  Chief Complaint: Generalized weakness and poor oral intake.  HPI: Deborah Martinez is a 87 y.o. female with medical history significant for dementia, chronic anxiety/depression, iron deficiency anemia, essential hypertension, permanent Afib on Eliquis, hyperlipidemia, chronic systolic CHF, hypothyroidism, recurrent UTI, dizziness, who presented to [facility] ED from ALF memory care with complaints of generalized weakness, lightheadedness, and poor oral intake.  History is mainly obtained from EDP and from review of medical records.  Unable to obtain a reliable history from the patient due to underlying dementia.  Work-up in the ED revealed urine analysis positive for pyuria.  Started on Rocephin in the ED empirically.  Urine culture obtained and is in process.  TRH, hospitalist team was asked to admit.  ED Course: Afebrile, BP 156/106, heart rate 76, respiratory rate 19, O2 saturation 98% on room air.  Lab studies remarkable for elevated creatinine 1.3 with baseline creatinine of 0.7, hemoglobin of 11.2 with baseline of 9.9 likely hemoconcentration from dehydration.  UA positive for pyuria.  Review of Systems: Review of systems as noted in the HPI. All other systems reviewed and are negative.   Past Medical History:  Diagnosis Date  . Anxiety   . Arrhythmia    atrial fibrillation  . CHF (congestive heart failure) (HCC)   . Constipation   . Cystocele   . Dementia (HCC)   . Depression   . Hemorrhoid   . Hypercholesterolemia   . Hypertension   . Hypothyroidism   . Incomplete bladder emptying   . Lymphedema   . Postmenopausal atrophic vaginitis   . Rectocele   . Thyroid disease   . UTI (lower urinary tract infection)   . Vaginal enterocele    Past Surgical History:  Procedure  Laterality Date  . APPENDECTOMY    . CATARACT EXTRACTION W/PHACO Left 12/13/2019   Procedure: CATARACT EXTRACTION PHACO AND INTRAOCULAR LENS PLACEMENT (IOC) LEFT 7.99 00:45.3;  Surgeon: Galen ManilaPorfilio, William, MD;  Location: [facility] ORS;  Service: Ophthalmology;  Laterality: Left;  . EYE SURGERY  2012   remve cataract  . INTRAMEDULLARY (IM) NAIL INTERTROCHANTERIC Left 11/20/2018   Procedure: INTRAMEDULLARY (IM) NAIL INTERTROCHANTRIC;  Surgeon: Christena FlakePoggi, John J, MD;  Location: [facility] ORS;  Service: Orthopedics;  Laterality: Left;  . TOTAL ABDOMINAL HYSTERECTOMY  1977    Social History:  reports that she quit smoking about 52 years ago. Her smoking use included cigarettes. She has never used smokeless tobacco. She reports that she does not drink alcohol and does not use drugs.   No Known Allergies  Family History  Problem Relation Age of Onset  . Heart attack Father   . Diabetes Brother   . Cancer Neg Hx       Prior to Admission medications   Medication Sig Start Date End Date Taking? Authorizing Provider  acetaminophen (TYLENOL) 325 MG tablet Take 2 tablets (650 mg total) by mouth every 6 (six) hours as needed for mild pain or fever. Patient taking differently: Take 650 mg by mouth every 6 (six) hours as needed for mild pain or fever. Do not exceed 3000mg  in 24 hours 11/23/18  Yes Esaw GrandchildGriffith, Kelly A, DO  alendronate (FOSAMAX) 70 MG tablet Take 70 mg by mouth once a week. Take with a full glass of water on an empty stomach.   Yes [provider]  Amino Acids-Protein Hydrolys (FEEDING SUPPLEMENT, PRO-STAT SUGAR FREE 64,) LIQD Take 30 mLs by mouth daily.   Yes [provider]  buPROPion (WELLBUTRIN SR) 100 MG 12 hr tablet Take 100 mg by mouth daily. 10/14/18  Yes [provider]  calcium carbonate (OSCAL) 1500 (600 Ca) MG TABS tablet Take 600 mg by mouth in the morning and at bedtime.   Yes [provider]  calcium carbonate (TUMS - DOSED IN MG ELEMENTAL CALCIUM) 500  MG chewable tablet Chew 2 tablets (400 mg of elemental calcium total) by mouth 2 (two) times daily as needed for indigestion or heartburn. 11/23/18  Yes Esaw Grandchild A, DO  docusate sodium (COLACE) 100 MG capsule Take 1 capsule (100 mg total) by mouth 2 (two) times daily. 11/23/18  Yes Esaw Grandchild A, DO  donepezil (ARICEPT) 10 MG tablet Take 10 mg by mouth at bedtime.  11/18/18  Yes [provider]  ELIQUIS 5 MG TABS tablet Take 5 mg by mouth 2 (two) times daily. 11/18/18  Yes [provider]  ergocalciferol (VITAMIN D2) 1.25 MG (50000 UT) capsule Take 5,000 Units by mouth every 30 (thirty) days.   Yes [provider]  feeding supplement, ENSURE ENLIVE, (ENSURE ENLIVE) LIQD Take 237 mLs by mouth 2 (two) times daily between meals. Patient taking differently: Take 237 mLs by mouth 2 (two) times daily between meals. STRAWBERRY ONLY 11/23/18  Yes Esaw Grandchild A, DO  furosemide (LASIX) 40 MG tablet Take 1 tablet (40 mg total) by mouth daily. 03/29/19 12/11/20 Yes Sreenath, Sudheer B, MD  levothyroxine (SYNTHROID, LEVOTHROID) 50 MCG tablet Take 50 mcg by mouth daily.    Yes [provider]  losartan (COZAAR) 100 MG tablet Take 100 mg by mouth daily.  11/11/18  Yes [provider]  meclizine (ANTIVERT) 12.5 MG tablet Take 1 tablet (12.5 mg total) by mouth 3 (three) times daily as needed for dizziness. 02/03/17  Yes Katha Hamming, MD  metoprolol succinate (TOPROL-XL) 50 MG 24 hr tablet Take 1 tablet (50 mg total) by mouth daily. 03/28/19 12/11/20 Yes Sreenath, Jonelle Sports, MD  Omega-3 Fatty Acids (FISH OIL) 1000 MG CAPS Take 1,000 mg by mouth daily.    Yes [provider]  phenylephrine-shark liver oil-mineral oil-petrolatum (PREPARATION H) 0.25-3-14-71.9 % rectal ointment Place 1 application rectally 4 (four) times daily as needed for hemorrhoids.   Yes [provider]  potassium chloride SA (KLOR-CON) 20 MEQ tablet Take 20 mEq by mouth  daily.   Yes [provider]  senna-docusate (SENOKOT-S) 8.6-50 MG tablet Take 1 tablet by mouth at bedtime as needed for moderate constipation. 11/23/18  Yes Esaw Grandchild A, DO  sertraline (ZOLOFT) 100 MG tablet Take 100 mg by mouth daily. 08/20/18  Yes [provider]  ferrous sulfate 325 (65 FE) MG tablet Take 325 mg by mouth daily with breakfast. Patient not taking: No sig reported    [provider]    Physical Exam: BP (!) 158/107   Pulse 67   Temp 98.3 F (36.8 C) (Oral)   Resp 11   Ht 5\' 5"  (1.651 m)   Wt 59 kg   SpO2 97%   BMI 21.64 kg/m   . General: 87 y.o. year-old female well developed well nourished in no acute distress.  Alert and pleasantly confused with underlying dementia. . Cardiovascular: Regular rate and rhythm with no rubs or gallops.  No thyromegaly or JVD noted.  No lower extremity edema. 2/4 pulses in all  4 extremities. Marland Kitchen Respiratory: Clear to auscultation with no wheezes or rales. Good inspiratory effort. . Abdomen: Soft nontender nondistended with normal bowel sounds x4 quadrants. . Muskuloskeletal: No cyanosis, clubbing or edema noted bilaterally . Neuro: CN II-XII intact, strength, sensation, reflexes . Skin: No ulcerative lesions noted or rashes . Psychiatry: Judgement and insight appear normal. Mood is appropriate for condition and setting          Labs on Admission:  Basic Metabolic Panel: Recent Labs  Lab 04/25/20 1329 04/25/20 1604  NA 136  --   K 4.3  --   CL 103  --   CO2 23  --   GLUCOSE 106*  --   BUN 27*  --   CREATININE 1.30* 1.19*  CALCIUM 8.5*  --    Liver Function Tests: No results for input(s): AST, ALT, ALKPHOS, BILITOT, PROT, ALBUMIN in the last 168 hours. No results for input(s): LIPASE, AMYLASE in the last 168 hours. No results for input(s): AMMONIA in the last 168 hours. CBC: Recent Labs  Lab 04/25/20 1329 04/25/20 1604  WBC 5.3 8.8  HGB 11.2* 11.3*  HCT 33.7* 34.5*  MCV 90.3 91.3   PLT 216 233   Cardiac Enzymes: No results for input(s): CKTOTAL, CKMB, CKMBINDEX, TROPONINI in the last 168 hours.  BNP (last 3 results) No results for input(s): BNP in the last 8760 hours.  ProBNP (last 3 results) No results for input(s): PROBNP in the last 8760 hours.  CBG: No results for input(s): GLUCAP in the last 168 hours.  Radiological Exams on Admission: No results found.  EKG: I independently viewed the EKG done and my findings are as followed: Normal sinus rhythm rate of 86, nonspecific ST-T changes.  QTc 486.  Assessment/Plan Present on Admission: . UTI (urinary tract infection)  Active Problems:   UTI (urinary tract infection)  Presumptive UTI, POA Presented with generalized weakness, lightheadedness and poor oral intake UA positive for pyuria. Urine culture obtained in process, follow Started on Rocephin empirically in the ED, continue.  AKI likely prerenal in the setting of dehydration. Hold off home diuretics for now. Presented with creatinine of 1.3 with GFR of 39. Hypovolemic on exam Creatinine at baseline 0.7 with GFR greater than 60. Start gentle IV fluid hydration normal saline at 30 cc/h x 1 day. Closely monitor urine output Avoid nephrotoxic agents, dehydration and hypotension. Closely monitor volume status while on IV fluid in the setting of chronic systolic CHF. Repeat renal panel in the morning.  Paroxysmal A. fib on Eliquis Resume home regimen Restart Eliquis for CVA prevention Monitor on telemetry.  Chronic systolic CHF Last 2D echo done on 03/23/19 revealed LVEF 25-30% Moderate to severe TV regurgitation Hold off home diuretic Start strict I&Os Daily weight  Chronic iron deficiency anemia Hg >11, likely hemoconcentration from dehydration Baseline Hg 9  Lightheadedness Orthostatic vital signs Fall precautions PT OT to assess     DVT prophylaxis: Eliquis  Code Status: Full code as stated the patient herself.  She states  she wants everything done to save her life.  Call family in the morning to confirm.  Family Communication: None at bedside.  Disposition Plan: Admit to MedSurg unit with remote telemetry.  Consults called: None.  Admission status: Inpatient status.  Patient will require at least 2 midnights for further evaluation and treatment of her condition   Status is: Inpatient   Dispo: The patient is from: ALF, memory care.  Anticipated d/c is to: ALF memory care.              Patient currently not stable for discharge due to ongoing symptomatology.    Difficult to place patient, not applicable.       Darlin Drop MD [facility] Pager [phone]  If 7PM-7AM, please contact night-coverage [email or web address] Password [facility]  04/25/2020, 4:50 PM

## 2020-04-25 NOTE — Progress Notes (Signed)
PHARMACIST - PHYSICIAN COMMUNICATION  CONCERNING:  Enoxaparin (Lovenox) for DVT Prophylaxis   DESCRIPTION: Patient was prescribed enoxaprin 40mg  q24 hours for VTE prophylaxis.   Filed Weights   04/25/20 1327  Weight: 59 kg (130 lb 1.1 oz)    Body mass index is 21.64 kg/m.  Estimated Creatinine Clearance: 25.9 mL/min (A) (by C-G formula based on SCr of 1.3 mg/dL (H)).   Patient is candidate for enoxaparin 30mg  every 24 hours based on CrCl <69ml/min or Weight <45kg  RECOMMENDATION: Pharmacy has adjusted enoxaparin dose per [facility] policy.  Patient is now receiving enoxaparin 30 mg every 24 hours    31m, PharmD Clinical Pharmacist  04/25/2020 4:06 PM

## 2020-04-25 NOTE — ED Provider Notes (Signed)
[facility] Emergency Department Provider Note  ____________________________________________  Time seen: Approximately 4:35 PM  I have reviewed the triage vital signs and the nursing notes.   HISTORY  Chief Complaint Recurrent UTI and Dizziness    Level 5 Caveat: Portions of the History and Physical including HPI and review of systems are unable to be completely obtained due to patient dementia  HPI Deborah Martinez is a 87 y.o. female with a history of CHF dementia hypertension hypothyroidism who comes to the ED due to lightheadedness, not eating or drinking for last couple of days, worsening generalized weakness.  Noted to have strong foul-smelling urine.  Normally she is ambulatory but has not been out of bed the last few days.  Comes to ED from [facility] due to history of dementia.      Past Medical History:  Diagnosis Date  . Anxiety   . Arrhythmia    atrial fibrillation  . CHF (congestive heart failure) (HCC)   . Constipation   . Cystocele   . Dementia (HCC)   . Depression   . Hemorrhoid   . Hypercholesterolemia   . Hypertension   . Hypothyroidism   . Incomplete bladder emptying   . Lymphedema   . Postmenopausal atrophic vaginitis   . Rectocele   . Thyroid disease   . UTI (lower urinary tract infection)   . Vaginal enterocele      Patient Active Problem List   Diagnosis Date Noted  . Acute decompensated heart failure (HCC) 03/23/2019  . Hypothyroid 11/20/2018  . Protein calorie malnutrition (HCC) 11/20/2018  . AF (paroxysmal atrial fibrillation) (HCC) 11/20/2018  . Femur fracture, left (HCC) 11/19/2018  . UTI (urinary tract infection) 02/01/2017  . Chest pain 01/10/2017  . Dizziness 01/10/2017  . Ataxia 01/10/2017  . Cystocele, midline 07/27/2014  . Menopause 07/27/2014  . Vaginal atrophy 07/27/2014  . Incomplete bladder emptying 07/27/2014  . Anxiety and depression 07/27/2014  . Constipation 07/27/2014  . Hypertension  07/27/2014  . Hypercholesterolemia 07/27/2014  . Hemorrhoids 07/27/2014  . Rectocele 07/27/2014  . Vaginal enterocele 07/27/2014     Past Surgical History:  Procedure Laterality Date  . APPENDECTOMY    . CATARACT EXTRACTION W/PHACO Left 12/13/2019   Procedure: CATARACT EXTRACTION PHACO AND INTRAOCULAR LENS PLACEMENT (IOC) LEFT 7.99 00:45.3;  Surgeon: Galen Manila, MD;  Location: [facility] ORS;  Service: Ophthalmology;  Laterality: Left;  . EYE SURGERY  2012   remve cataract  . INTRAMEDULLARY (IM) NAIL INTERTROCHANTERIC Left 11/20/2018   Procedure: INTRAMEDULLARY (IM) NAIL INTERTROCHANTRIC;  Surgeon: Christena Flake, MD;  Location: [facility] ORS;  Service: Orthopedics;  Laterality: Left;  . TOTAL ABDOMINAL HYSTERECTOMY  1977     Prior to Admission medications   Medication Sig Start Date End Date Taking? Authorizing Provider  acetaminophen (TYLENOL) 325 MG tablet Take 2 tablets (650 mg total) by mouth every 6 (six) hours as needed for mild pain or fever. Patient taking differently: Take 650 mg by mouth every 6 (six) hours as needed for mild pain or fever. Do not exceed 3000mg  in 24 hours 11/23/18  Yes 13/10/20 A, DO  alendronate (FOSAMAX) 70 MG tablet Take 70 mg by mouth once a week. Take with a full glass of water on an empty stomach.   Yes [provider]  Amino Acids-Protein Hydrolys (FEEDING SUPPLEMENT, PRO-STAT SUGAR FREE 64,) LIQD Take 30 mLs by mouth daily.   Yes [provider]  buPROPion (WELLBUTRIN SR) 100 MG 12 hr tablet  Take 100 mg by mouth daily. 10/14/18  Yes [provider]  calcium carbonate (OSCAL) 1500 (600 Ca) MG TABS tablet Take 600 mg by mouth in the morning and at bedtime.   Yes [provider]  calcium carbonate (TUMS - DOSED IN MG ELEMENTAL CALCIUM) 500 MG chewable tablet Chew 2 tablets (400 mg of elemental calcium total) by mouth 2 (two) times daily as needed for indigestion or heartburn. 11/23/18  Yes Esaw GrandchildGriffith, Kelly A, DO   docusate sodium (COLACE) 100 MG capsule Take 1 capsule (100 mg total) by mouth 2 (two) times daily. 11/23/18  Yes Esaw GrandchildGriffith, Kelly A, DO  donepezil (ARICEPT) 10 MG tablet Take 10 mg by mouth at bedtime.  11/18/18  Yes [provider]  ELIQUIS 5 MG TABS tablet Take 5 mg by mouth 2 (two) times daily. 11/18/18  Yes [provider]  ergocalciferol (VITAMIN D2) 1.25 MG (50000 UT) capsule Take 5,000 Units by mouth every 30 (thirty) days.   Yes [provider]  feeding supplement, ENSURE ENLIVE, (ENSURE ENLIVE) LIQD Take 237 mLs by mouth 2 (two) times daily between meals. Patient taking differently: Take 237 mLs by mouth 2 (two) times daily between meals. STRAWBERRY ONLY 11/23/18  Yes Esaw GrandchildGriffith, Kelly A, DO  furosemide (LASIX) 40 MG tablet Take 1 tablet (40 mg total) by mouth daily. 03/29/19 12/11/20 Yes Sreenath, Sudheer B, MD  levothyroxine (SYNTHROID, LEVOTHROID) 50 MCG tablet Take 50 mcg by mouth daily.    Yes [provider]  losartan (COZAAR) 100 MG tablet Take 100 mg by mouth daily.  11/11/18  Yes [provider]  meclizine (ANTIVERT) 12.5 MG tablet Take 1 tablet (12.5 mg total) by mouth 3 (three) times daily as needed for dizziness. 02/03/17  Yes Katha HammingKonidena, Snehalatha, MD  metoprolol succinate (TOPROL-XL) 50 MG 24 hr tablet Take 1 tablet (50 mg total) by mouth daily. 03/28/19 12/11/20 Yes Sreenath, Jonelle SportsSudheer B, MD  Omega-3 Fatty Acids (FISH OIL) 1000 MG CAPS Take 1,000 mg by mouth daily.    Yes [provider]  phenylephrine-shark liver oil-mineral oil-petrolatum (PREPARATION H) 0.25-3-14-71.9 % rectal ointment Place 1 application rectally 4 (four) times daily as needed for hemorrhoids.   Yes [provider]  potassium chloride SA (KLOR-CON) 20 MEQ tablet Take 20 mEq by mouth daily.   Yes [provider]  senna-docusate (SENOKOT-S) 8.6-50 MG tablet Take 1 tablet by mouth at bedtime as needed for moderate constipation. 11/23/18  Yes  Esaw GrandchildGriffith, Kelly A, DO  sertraline (ZOLOFT) 100 MG tablet Take 100 mg by mouth daily. 08/20/18  Yes [provider]  ferrous sulfate 325 (65 FE) MG tablet Take 325 mg by mouth daily with breakfast. Patient not taking: No sig reported    [provider]     Allergies Patient has no known allergies.   Family History  Problem Relation Age of Onset  . Heart attack Father   . Diabetes Brother   . Cancer Neg Hx     Social History Social History   Tobacco Use  . Smoking status: Former Smoker    Types: Cigarettes    Quit date: 1970    Years since quitting: 52.3  . Smokeless tobacco: Never Used  Vaping Use  . Vaping Use: Never used  Substance Use Topics  . Alcohol use: No  . Drug use: No    Review of Systems Level 5 Caveat: Portions of the History and Physical including HPI and review of systems are unable to be completely obtained  due to patient being a poor historian   Constitutional:   No known fever.  ENT:   No rhinorrhea. Cardiovascular:   No chest pain or syncope. Respiratory:   No dyspnea or cough. Gastrointestinal:   Negative for abdominal pain, vomiting and diarrhea.  Musculoskeletal:   Negative for focal pain or swelling ____________________________________________   PHYSICAL EXAM:  VITAL SIGNS: ED Triage Vitals  Enc Vitals Group     BP 04/25/20 1334 (!) 146/99     Pulse Rate 04/25/20 1334 73     Resp 04/25/20 1334 16     Temp 04/25/20 1334 98.3 F (36.8 C)     Temp Source 04/25/20 1334 Oral     SpO2 04/25/20 1334 100 %     Weight 04/25/20 1327 130 lb 1.1 oz (59 kg)     Height 04/25/20 1327 5\' 5"  (1.651 m)     Head Circumference --      Peak Flow --      Pain Score 04/25/20 1326 0     Pain Loc --      Pain Edu? --      Excl. in GC? --     Vital signs reviewed, nursing assessments reviewed.   Constitutional: Awake and alert, oriented to self. Non-toxic appearance. Eyes:   Conjunctivae are normal. EOMI. PERRL. ENT      Head:    Normocephalic and atraumatic.      Nose:   No congestion/rhinnorhea.       Mouth/Throat:   Dry mucous membranes, no pharyngeal erythema. No peritonsillar mass.       Neck:   No meningismus. Full ROM. Hematological/Lymphatic/Immunilogical:   No cervical lymphadenopathy. Cardiovascular:   RRR. Symmetric bilateral radial and DP pulses.  No murmurs. Cap refill less than 2 seconds. Respiratory:   Normal respiratory effort without tachypnea/retractions. Breath sounds are clear and equal bilaterally. No wheezes/rales/rhonchi. Gastrointestinal:   Soft with suprapubic tenderness. Non distended. There is no CVA tenderness.  No rebound, rigidity, or guarding. Genitourinary:   deferred Musculoskeletal:   Normal range of motion in all extremities. No joint effusions.  No lower extremity tenderness.  No edema. Neurologic:   Normal speech, limited language expression.  Motor grossly intact. No acute focal neurologic deficits are appreciated.  Skin:    Skin is warm, dry and intact. No rash noted.  No petechiae, purpura, or bullae.  ____________________________________________    LABS (pertinent positives/negatives) (all labs ordered are listed, but only abnormal results are displayed) Labs Reviewed  BASIC METABOLIC PANEL - Abnormal; Notable for the following components:      Result Value   Glucose, Bld 106 (*)    BUN 27 (*)    Creatinine, Ser 1.30 (*)    Calcium 8.5 (*)    GFR, Estimated 39 (*)    All other components within normal limits  CBC - Abnormal; Notable for the following components:   RBC 3.73 (*)    Hemoglobin 11.2 (*)    HCT 33.7 (*)    All other components within normal limits  URINALYSIS, COMPLETE (UACMP) WITH MICROSCOPIC - Abnormal; Notable for the following components:   Color, Urine YELLOW (*)    APPearance HAZY (*)    Leukocytes,Ua MODERATE (*)    All other components within normal limits  URINE CULTURE  RESP PANEL BY RT-PCR (FLU A&B, COVID) ARPGX2  CBC  CREATININE, SERUM    ____________________________________________   EKG    ____________________________________________    RADIOLOGY  No results found.  ____________________________________________   PROCEDURES Procedures  ____________________________________________    CLINICAL IMPRESSION / ASSESSMENT AND PLAN / ED COURSE  Medications ordered in the ED: Medications  enoxaparin (LOVENOX) injection 30 mg (has no administration in time range)  donepezil (ARICEPT) tablet 10 mg (has no administration in time range)  levothyroxine (SYNTHROID) tablet 50 mcg (has no administration in time range)  sodium chloride 0.9 % bolus 1,000 mL (0 mLs Intravenous Stopped 04/25/20 1517)  cefTRIAXone (ROCEPHIN) 1 g in sodium chloride 0.9 % 100 mL IVPB (0 g Intravenous Stopped 04/25/20 1550)    Pertinent labs & imaging results that were available during my care of the patient were reviewed by me and considered in my medical decision making (see chart for details).   Deborah Martinez was evaluated in Emergency Department on 04/25/2020 for the symptoms described in the history of present illness. She was evaluated in the context of the global COVID-19 pandemic, which necessitated consideration that the patient might be at risk for infection with the SARS-CoV-2 virus that causes COVID-19. Institutional protocols and algorithms that pertain to the evaluation of patients at risk for COVID-19 are in a state of rapid change based on information released by regulatory bodies including the CDC and federal and state organizations. These policies and algorithms were followed during the patient's care in the ED.   Patient presents with generalized weakness, not eating or drinking.  Vital signs unremarkable, found to have UTI.  With her deterioration of functional status in the setting of this acute illness, she will require IV fluids and antibiotics until improving.  This appears to be beyond the capability of her current facility so  will admit to hospitalist for initial management.  Rocephin ordered, urine culture ordered.      ____________________________________________   FINAL CLINICAL IMPRESSION(S) / ED DIAGNOSES    Final diagnoses:  Cystitis  Dehydration  Dementia without behavioral disturbance, unspecified dementia type (HCC)  Protein-calorie malnutrition, unspecified severity (HCC)  Paroxysmal atrial fibrillation [facility])     ED Discharge Orders    None      Portions of this note were generated with dragon dictation software. Dictation errors may occur despite best attempts at proofreading.   Sharman Cheek, MD 04/25/20 [phone]

## 2020-04-25 NOTE — ED Notes (Signed)
Report messaged to andrea j rn floor nurse

## 2020-04-25 NOTE — ED Notes (Signed)
Report messaged to

## 2020-04-26 DIAGNOSIS — F0390 Unspecified dementia without behavioral disturbance: Secondary | ICD-10-CM

## 2020-04-26 DIAGNOSIS — N179 Acute kidney failure, unspecified: Principal | ICD-10-CM

## 2020-04-26 LAB — COMPREHENSIVE METABOLIC PANEL
ALT: 136 U/L — ABNORMAL HIGH (ref 0–44)
AST: 111 U/L — ABNORMAL HIGH (ref 15–41)
Albumin: 3 g/dL — ABNORMAL LOW (ref 3.5–5.0)
Alkaline Phosphatase: 158 U/L — ABNORMAL HIGH (ref 38–126)
Anion gap: 10 (ref 5–15)
BUN: 30 mg/dL — ABNORMAL HIGH (ref 8–23)
CO2: 22 mmol/L (ref 22–32)
Calcium: 8.5 mg/dL — ABNORMAL LOW (ref 8.9–10.3)
Chloride: 106 mmol/L (ref 98–111)
Creatinine, Ser: 1.18 mg/dL — ABNORMAL HIGH (ref 0.44–1.00)
GFR, Estimated: 44 mL/min — ABNORMAL LOW (ref >60)
Glucose, Bld: 122 mg/dL — ABNORMAL HIGH (ref 70–99)
Potassium: 4.1 mmol/L (ref 3.5–5.1)
Sodium: 138 mmol/L (ref 135–145)
Total Bilirubin: 0.7 mg/dL (ref 0.3–1.2)
Total Protein: 5.6 g/dL — ABNORMAL LOW (ref 6.5–8.1)

## 2020-04-26 LAB — CBC WITH DIFFERENTIAL/PLATELET
Abs Immature Granulocytes: 0.04 10*3/uL (ref 0.00–0.07)
Basophils Absolute: 0 10*3/uL (ref 0.0–0.1)
Basophils Relative: 1 %
Eosinophils Absolute: 0.1 10*3/uL (ref 0.0–0.5)
Eosinophils Relative: 1 %
HCT: 33.2 % — ABNORMAL LOW (ref 36.0–46.0)
Hemoglobin: 10.9 g/dL — ABNORMAL LOW (ref 12.0–15.0)
Immature Granulocytes: 1 %
Lymphocytes Relative: 8 %
Lymphs Abs: 0.5 10*3/uL — ABNORMAL LOW (ref 0.7–4.0)
MCH: 30 pg (ref 26.0–34.0)
MCHC: 32.8 g/dL (ref 30.0–36.0)
MCV: 91.5 fL (ref 80.0–100.0)
Monocytes Absolute: 0.8 10*3/uL (ref 0.1–1.0)
Monocytes Relative: 12 %
Neutro Abs: 4.9 10*3/uL (ref 1.7–7.7)
Neutrophils Relative %: 77 %
Platelets: 192 10*3/uL (ref 150–400)
RBC: 3.63 MIL/uL — ABNORMAL LOW (ref 3.87–5.11)
RDW: 15.3 % (ref 11.5–15.5)
WBC: 6.4 10*3/uL (ref 4.0–10.5)
nRBC: 0 % (ref 0.0–0.2)

## 2020-04-26 LAB — PHOSPHORUS: Phosphorus: 3.3 mg/dL (ref 2.5–4.6)

## 2020-04-26 LAB — URINE CULTURE: Culture: NO GROWTH

## 2020-04-26 LAB — MAGNESIUM: Magnesium: 1.9 mg/dL (ref 1.7–2.4)

## 2020-04-26 MED ORDER — TRAZODONE HCL 50 MG PO TABS
25.0000 mg | ORAL_TABLET | Freq: Every evening | ORAL | Status: DC | PRN
  Administered 2020-04-26: 25 mg via ORAL
  Filled 2020-04-26: qty 1

## 2020-04-26 MED ORDER — APIXABAN 2.5 MG PO TABS
2.5000 mg | ORAL_TABLET | Freq: Two times a day (BID) | ORAL | Status: DC
  Administered 2020-04-26 – 2020-04-27 (×3): 2.5 mg via ORAL
  Filled 2020-04-26 (×3): qty 1

## 2020-04-26 MED ORDER — ONDANSETRON HCL 4 MG/2ML IJ SOLN
4.0000 mg | INTRAMUSCULAR | Status: DC | PRN
  Administered 2020-04-26: 4 mg via INTRAVENOUS
  Filled 2020-04-26: qty 2

## 2020-04-26 MED ORDER — ACETAMINOPHEN 325 MG PO TABS
650.0000 mg | ORAL_TABLET | ORAL | Status: DC | PRN
  Administered 2020-04-26: 650 mg via ORAL
  Filled 2020-04-26: qty 2

## 2020-04-26 MED ORDER — ALUM & MAG HYDROXIDE-SIMETH 200-200-20 MG/5ML PO SUSP: 30.0000 mL | ORAL | Status: DC | PRN

## 2020-04-26 NOTE — Progress Notes (Signed)
PHARMACY NOTE:  DOSAGE ADJUSTMENT  Current anticoagulation regimen includes a mismatch between apixaban dosage and indication for current age/weight.  As per policy approved by the Pharmacy & Therapeutics and Medical Executive Committees, the apixaban dosage will be adjusted accordingly.  Current dosage:  apixaban 5 mg po BID  parameters indicating adjustments:  ? 87 years of age and body weight ? 60 kg  Indication: atrial fibrillation  Renal Function:  Estimated Creatinine Clearance: 28.5 mL/min (A) (by C-G formula based on SCr of 1.18 mg/dL (H)). []      On intermittent HD, scheduled: []      On CRRT    apixaban dosage has been changed to:  2.5 mg po BID   Thank you for allowing pharmacy to be a part of this patient's care.  , [facility] 04/26/2020 8:36 AM

## 2020-04-26 NOTE — Evaluation (Signed)
Physical Therapy Evaluation Patient Details Name: Deborah Martinez MRN: [id number] DOB: Jan 27, 1927 Today's Date: 04/26/2020   History of Present Illness  Pt is admitted for complaints of weakness, now diagnosed with UTI. History includes dementia, anxiety, depression, anemia, HTN, Afib, HLD, and recurrent UTI.  Clinical Impression  Pt is a pleasant 87 year old female who was admitted for UTI. Pt performs bed mobility, transfers, and ambulation with cga and RW. Pt demonstrates deficits with strength/dizziness/mobility. Pt appears close to baseline, although would benefit from orthostatics to rule out cause of dizziness as that was main limiting factor. Would benefit from skilled PT to address above deficits and promote optimal return to PLOF. Recommend transition to HHPT upon discharge from acute hospitalization.     Follow Up Recommendations Home health PT (return to ALF with HHPT)    Equipment Recommendations  None recommended by PT    Recommendations for Other Services       Precautions / Restrictions Precautions Precautions: Fall Restrictions Weight Bearing Restrictions: No      Mobility  Bed Mobility Overal bed mobility: Needs Assistance Bed Mobility: Supine to Sit     Supine to sit: Min guard     General bed mobility comments: Safe technique. Once seated, able to sit with upright posture. Follows commands well. Does complain of dizziness with mobility.    Transfers Overall transfer level: Needs assistance Equipment used: Rolling walker (2 wheeled) Transfers: Sit to/from Stand Sit to Stand: Min guard         General transfer comment: Safe technique. Once standing, upright posture  Ambulation/Gait Ambulation/Gait assistance: Min guard Gait Distance (Feet): 5 Feet Assistive device: Rolling walker (2 wheeled) Gait Pattern/deviations: Step-through pattern     General Gait Details: ambulated to recliner, however limited due to dizziness.  Stairs             Wheelchair Mobility    Modified Rankin (Stroke Patients Only)       Balance Overall balance assessment: Needs assistance Sitting-balance support: Feet supported;Bilateral upper extremity supported Sitting balance-Leahy Scale: Good     Standing balance support: Bilateral upper extremity supported Standing balance-Leahy Scale: Good                               Pertinent Vitals/Pain Pain Assessment: No/denies pain    Home Living Family/patient expects to be discharged to:: Assisted living               Home Equipment: Walker - 2 wheels      Prior Function Level of Independence: Needs assistance         Comments: Pt is poor historian, unsure of what assistance ALF provides. Reports she uses RW.     Hand Dominance        Extremity/Trunk Assessment   Upper Extremity Assessment Upper Extremity Assessment: Generalized weakness (B UE grossly 4/5)    Lower Extremity Assessment Lower Extremity Assessment: Generalized weakness (B LE grossly 3+/5)       Communication   Communication: No difficulties  Cognition Arousal/Alertness: Awake/alert (sleepy, but easily awakens) Behavior During Therapy: WFL for tasks assessed/performed Overall Cognitive Status: Within Functional Limits for tasks assessed                                 General Comments: A&O x 3, isn't sure why she is admitted. Poor historian. Follows commands well  and pleasant      General Comments      Exercises Other Exercises Other Exercises: Seated ther-ex performed on B LE including hip abd/add, SLRs, and AP. All ther-ex performed x 10 reps with supervision.   Assessment/Plan    PT Assessment Patient needs continued PT services  PT Problem List Decreased strength;Decreased activity tolerance;Decreased balance;Decreased mobility;Decreased cognition       PT Treatment Interventions DME instruction;Gait training;Therapeutic exercise;Balance training    PT  Goals (Current goals can be found in the Care Plan section)  Acute Rehab PT Goals Patient Stated Goal: to get out of the hospital PT Goal Formulation: With patient Time For Goal Achievement: 05/10/20 Potential to Achieve Goals: Good    Frequency Min 2X/week   Barriers to discharge        Co-evaluation               AM-PAC PT "6 Clicks" Mobility  Outcome Measure Help needed turning from your back to your side while in a flat bed without using bedrails?: A Little Help needed moving from lying on your back to sitting on the side of a flat bed without using bedrails?: A Little Help needed moving to and from a bed to a chair (including a wheelchair)?: A Little Help needed standing up from a chair using your arms (e.g., wheelchair or bedside chair)?: A Little Help needed to walk in hospital room?: A Little Help needed climbing 3-5 steps with a railing? : A Lot 6 Click Score: 17    End of Session Equipment Utilized During Treatment: Gait belt Activity Tolerance: Patient tolerated treatment well Patient left: in chair;with chair alarm set Nurse Communication: Mobility status PT Visit Diagnosis: Unsteadiness on feet (R26.81);Muscle weakness (generalized) (M62.81)    Time: 4327-6147 PT Time Calculation (min) (ACUTE ONLY): 17 min   Charges:   PT Evaluation $PT Eval Low Complexity: 1 Low PT Treatments $Therapeutic Exercise: 8-22 mins        Elizabeth Palau, PT, DPT [phone]   Deborah Martinez 04/26/2020, 1:01 PM

## 2020-04-26 NOTE — Plan of Care (Signed)
  Problem: Education: Goal: Knowledge of General Education information will improve Description Including pain rating scale, medication(s)/side effects and non-pharmacologic comfort measures Outcome: Progressing   

## 2020-04-26 NOTE — Progress Notes (Signed)
Deborah Martinez is spouse of [name] pt's HCPOA. Toma Aran has verbally consented to provide updates to his spouse Deborah Martinez.

## 2020-04-26 NOTE — Evaluation (Signed)
Occupational Therapy Evaluation Patient Details Name: Deborah Martinez MRN: [id number] DOB: [birth year] Today's Date: 04/26/2020    History of Present Illness Pt is admitted for complaints of weakness, now diagnosed with UTI. History includes dementia, anxiety, depression, anemia, HTN, Afib, HLD, and recurrent UTI.   Clinical Impression   Pt seen for OT evaluation on this date. Upon arrival to room, pt asleep in recliner. Pt easily awoken and agreeable to OT evaluation. Pt pleasantly confused, oriented to self only, however able to follow 1-step instructions consistently. Pt is an unreliable historian, stating that she couldn't remember if she needed assistance for ADLs prior to admission. Pt reported that she used RW for functional mobility. Pt currently requires SUPERVISION for seated LB dressing and MIN GUARD for functional mobility of short household distances with RW. Of note, pt reporting dizziness throughout session and orthostatic vitals obtained:  Orthostatics: sitting w/ feet elevated BP 139/77, sitting w/ feet lowered BP 113/81, sitting w/ feet lowered following seated LE exercises 134/76, and standing BP 138/81.  No LOB observed during session. Pt would benefit from additional skilled OT services to maximize independence with ADLs and functional mobility and prevent further deconditioning. Upon discharge, recommend return to ALF with HHOT.     Follow Up Recommendations  Home health OT;Other (comment) (return to ALF with HHOT)    Equipment Recommendations  None recommended by OT       Precautions / Restrictions Precautions Precautions: Fall Precaution Comments: hypotensive Restrictions Weight Bearing Restrictions: No      Mobility Bed Mobility Overal bed mobility: Needs Assistance Bed Mobility: Sit to Supine     Supine to sit: Min guard Sit to supine: Min guard   General bed mobility comments: Safe technique. Once seated, able to sit with upright posture. Follows commands  well. Does complain of dizziness with mobility.    Transfers Overall transfer level: Needs assistance Equipment used: Rolling walker (2 wheeled) Transfers: Sit to/from [employer] Sit to Stand: Min guard Stand pivot transfers: Min guard       General transfer comment: Safe technique. Once standing, upright posture    Balance Overall balance assessment: Needs assistance Sitting-balance support: No upper extremity supported;Feet supported Sitting balance-Leahy Scale: Good Sitting balance - Comments: Good sitting balance while performing seated LB dressing   Standing balance support: Bilateral upper extremity supported;During functional activity Standing balance-Leahy Scale: Good Standing balance comment: UE support from RW and MIN GUARD for stand pivot transfer                           ADL either performed or assessed with clinical judgement   ADL Overall ADL's : Needs assistance/impaired     Grooming: Wash/dry face;Set up;Sitting               Lower Body Dressing: Min guard;Sitting/lateral leans Lower Body Dressing Details (indicate cue type and reason): to don/doff socks             Functional mobility during ADLs: Min guard;Rolling walker       Vision Baseline Vision/History: Wears glasses Patient Visual Report: No change from baseline              Pertinent Vitals/Pain Pain Assessment: No/denies pain        Extremity/Trunk Assessment Upper Extremity Assessment Upper Extremity Assessment: Generalized weakness (B UE grossly 4/5)   Lower Extremity Assessment Lower Extremity Assessment: Defer to PT evaluation       Communication  Communication Communication: No difficulties   Cognition Arousal/Alertness: Awake/alert (sleepy, but easily awakens) Behavior During Therapy: WFL for tasks assessed/performed Overall Cognitive Status: History of cognitive impairments - at baseline                                  General Comments: A&Ox1 (self only). Pleasantly confused and able to follow 1-step commands consistently   General Comments       Exercises Exercises: Other exercises Other Exercises Other Exercises: Seated ther-ex performed on B LE including hip abd/add, SLRs, and AP. All ther-ex performed x 10 reps with supervision.   Shoulder Instructions      Home Living Family/patient expects to be discharged to:: Assisted living                             Home Equipment: Walker - 2 wheels          Prior Functioning/Environment Level of Independence: Needs assistance        Comments: Pt is an unreliable historian and says she cant remember if she requires assistance for ADLs. Reports she uses RW for functional mobilty        OT Problem List: Impaired balance (sitting and/or standing)      OT Treatment/Interventions: Self-care/ADL training;Therapeutic exercise;Energy conservation;DME and/or AE instruction;Therapeutic activities;Balance training;Patient/family education    OT Goals(Current goals can be found in the care plan section) Acute Rehab OT Goals Patient Stated Goal: to go to bed OT Goal Formulation: With patient Time For Goal Achievement: 05/10/20 Potential to Achieve Goals: Good ADL Goals Pt Will Perform Grooming: with modified independence;standing Pt Will Transfer to Toilet: with modified independence;ambulating;bedside commode Pt Will Perform Toileting - Clothing Manipulation and hygiene: with modified independence;sitting/lateral leans  OT Frequency: Min 1X/week    AM-PAC OT "6 Clicks" Daily Activity     Outcome Measure Help from another person eating meals?: None Help from another person taking care of personal grooming?: A Little Help from another person toileting, which includes using toliet, bedpan, or urinal?: A Little Help from another person bathing (including washing, rinsing, drying)?: A Little Help from another person to put on and  taking off regular upper body clothing?: None Help from another person to put on and taking off regular lower body clothing?: A Little 6 Click Score: 20   End of Session Equipment Utilized During Treatment: Gait belt;Rolling walker Nurse Communication: Mobility status  Activity Tolerance: Patient tolerated treatment well Patient left: in bed;with call bell/phone within reach;with bed alarm set  OT Visit Diagnosis: Unsteadiness on feet (R26.81)                Time: 0867-6195 OT Time Calculation (min): 29 min Charges:  OT General Charges $OT Visit: 1 Visit OT Evaluation $OT Eval Moderate Complexity: 1 Mod OT Treatments $Self Care/Home Management : 23-37 mins  Matthew Folks, OTR/L [facility] [phone]

## 2020-04-26 NOTE — Progress Notes (Signed)
PROGRESS NOTE    Deborah Martinez  HYQ:[id number] DOB: 12/19/1927 DOA: 04/25/2020 PCP: [name], MD  114A/[id number]   Assessment & Plan:   Active Problems:   UTI (urinary tract infection)   Deborah Martinez is a 87 y.o. female with medical history significant for dementia, chronic anxiety/depression, iron deficiency anemia, essential hypertension, permanent Afib on Eliquis, hyperlipidemia, chronic systolic CHF, hypothyroidism, recurrent UTI, dizziness, who presented to [facility] ED from ALF memory care with complaints of generalized weakness, lightheadedness, and poor oral intake.    UTI, ruled out --no urinary symptoms.  Urine cx neg growth. --d/c ceftriaxone  AKI likely prerenal in the setting of dehydration. Presented with creatinine of 1.3 with GFR of 39.  Creatinine at baseline 0.7 with GFR greater than 60. Started gentle IV fluid hydration normal saline at 30 cc/h x 1 day. Plan: --encourage oral hydration --will need scheduled fluid feeding at ALF  Paroxysmal A. fib on Eliquis --cont home Toprol --cont home Eliquis  Chronic systolic CHF Last 2D echo done on 03/23/19 revealed LVEF 25-30% Moderate to severe TV regurgitation --hold home lasix due to AKI  Chronic iron deficiency anemia Hg >11, likely hemoconcentration from dehydration Baseline Hg 9 --cont oral iron supplement at discharge  Lightheadedness  Orthostatic neg Fall precautions --PT/OT  Dementia Poor oral intake --likely progression of dementia, with decreased interest in oral intake and more time spend sleeping --will need scheduled liquid feeding at ALF    DVT prophylaxis: XB:MWUXLKG Code Status: Full code  Family Communication: son updated on the phone today Level of care: Med-Surg Dispo:   The patient is from: ALF Anticipated d/c is to: ALF Anticipated d/c date is: tomorrow Patient currently is medically ready to d/c.   Subjective and Interval History:  Pt denied dysuria, abdominal pain.   Decreased oral intake.     Objective: Vitals:   04/26/20 0824 04/26/20 1246 04/26/20 1824 04/26/20 2348  BP: (!) 153/97 127/88 (!) 143/90 (!) 143/91  Pulse: 95 61 (!) 57 (!) 59  Resp: 18 17 18 20   Temp: 98 F (36.7 C) (!) 96.8 F (36 C) 98.7 F (37.1 C) 98.6 F (37 C)  TempSrc: Oral     SpO2: 96% 99% 100% 96%  Weight:      Height:        Intake/Output Summary (Last 24 hours) at 04/27/2020 0257 Last data filed at 04/26/2020 2040 Gross per 24 hour  Intake 360 ml  Output 100 ml  Net 260 ml   Filed Weights   04/25/20 1327 04/26/20 0500  Weight: 59 kg 59 kg    Examination:   Constitutional: NAD, lethargic but responding to questions HEENT: conjunctivae and lids normal, EOMI CV: No cyanosis.   RESP: normal respiratory effort, on RA Extremities: No effusions, edema in BLE SKIN: warm, dry Neuro: II - XII grossly intact.   Psych: depressed mood and affect.     Data Reviewed: I have personally reviewed following labs and imaging studies  CBC: Recent Labs  Lab 04/25/20 1329 04/25/20 1604 04/26/20 0422  WBC 5.3 8.8 6.4  NEUTROABS  --   --  4.9  HGB 11.2* 11.3* 10.9*  HCT 33.7* 34.5* 33.2*  MCV 90.3 91.3 91.5  PLT 216 233 192   Basic Metabolic Panel: Recent Labs  Lab 04/25/20 1329 04/25/20 1604 04/26/20 0422  NA 136  --  138  K 4.3  --  4.1  CL 103  --  106  CO2 23  --  22  GLUCOSE 106*  --  122*  BUN 27*  --  30*  CREATININE 1.30* 1.19* 1.18*  CALCIUM 8.5*  --  8.5*  MG  --   --  1.9  PHOS  --   --  3.3   GFR: Estimated Creatinine Clearance: 28.5 mL/min (A) (by C-G formula based on SCr of 1.18 mg/dL (H)). Liver Function Tests: Recent Labs  Lab 04/26/20 0422  AST 111*  ALT 136*  ALKPHOS 158*  BILITOT 0.7  PROT 5.6*  ALBUMIN 3.0*   No results for input(s): LIPASE, AMYLASE in the last 168 hours. No results for input(s): AMMONIA in the last 168 hours. Coagulation Profile: No results for input(s): INR, PROTIME in the last 168 hours. Cardiac  Enzymes: No results for input(s): CKTOTAL, CKMB, CKMBINDEX, TROPONINI in the last 168 hours. BNP (last 3 results) No results for input(s): PROBNP in the last 8760 hours. HbA1C: No results for input(s): HGBA1C in the last 72 hours. CBG: Recent Labs  Lab 04/25/20 2234  GLUCAP 131*   Lipid Profile: No results for input(s): CHOL, HDL, LDLCALC, TRIG, CHOLHDL, LDLDIRECT in the last 72 hours. Thyroid Function Tests: No results for input(s): TSH, T4TOTAL, FREET4, T3FREE, THYROIDAB in the last 72 hours. Anemia Panel: No results for input(s): VITAMINB12, FOLATE, FERRITIN, TIBC, IRON, RETICCTPCT in the last 72 hours. Sepsis Labs: No results for input(s): PROCALCITON, LATICACIDVEN in the last 168 hours.  Recent Results (from the past 240 hour(s))  Urine Culture     Status: None   Collection Time: 04/25/20  2:00 PM   Specimen: Urine, Catheterized  Result Value Ref Range Status   Specimen Description   Final    URINE, CATHETERIZED Performed at [facility], [address] [place], [place] [address]    Special Requests   Final    NONE Performed at [facility], [address] [place], [place] [address]    Culture   Final    NO GROWTH Performed at [facility], [address] [place], [place] [address]    Report Status 04/26/2020 FINAL  Final  Resp Panel by RT-PCR (Flu A&B, Covid) Nasopharyngeal Swab     Status: None   Collection Time: 04/25/20  4:03 PM   Specimen: Nasopharyngeal Swab; Nasopharyngeal(NP) swabs in vial transport medium  Result Value Ref Range Status   SARS Coronavirus 2 by RT PCR NEGATIVE NEGATIVE Final    Comment: (NOTE) SARS-CoV-2 target nucleic acids are NOT DETECTED.  The SARS-CoV-2 RNA is generally detectable in upper respiratory specimens during the acute phase of infection. The lowest concentration of SARS-CoV-2 viral copies this assay can detect is 138 copies/mL. A negative result does not preclude SARS-Cov-2 infection and should not  be used as the sole basis for treatment or other patient management decisions. A negative result may occur with  improper specimen collection/handling, submission of specimen other than nasopharyngeal swab, presence of viral mutation(s) within the areas targeted by this assay, and inadequate number of viral copies(<138 copies/mL). A negative result must be combined with clinical observations, patient history, and epidemiological information. The expected result is Negative.  Fact Sheet for Patients:  BloggerCourse.com  Fact Sheet for Healthcare Providers:  SeriousBroker.it  This test is no t yet approved or cleared by the [place] FDA and  has been authorized for detection and/or diagnosis of SARS-CoV-2 by FDA under an Emergency Use Authorization (EUA). This EUA will remain  in effect (meaning this test can be used) for the duration  of the COVID-19 declaration under Section 564(b)(1) of the Act, 21 U.S.C.section 360bbb-3(b)(1), unless the authorization is terminated  or revoked sooner.       Influenza A by PCR NEGATIVE NEGATIVE Final   Influenza B by PCR NEGATIVE NEGATIVE Final    Comment: (NOTE) The Xpert Xpress SARS-CoV-2/FLU/RSV plus assay is intended as an aid in the diagnosis of influenza from Nasopharyngeal swab specimens and should not be used as a sole basis for treatment. Nasal washings and aspirates are unacceptable for Xpert Xpress SARS-CoV-2/FLU/RSV testing.  Fact Sheet for Patients: BloggerCourse.com  Fact Sheet for Healthcare Providers: SeriousBroker.it  This test is not yet approved or cleared by the [place] FDA and has been authorized for detection and/or diagnosis of SARS-CoV-2 by FDA under an Emergency Use Authorization (EUA). This EUA will remain in effect (meaning this test can be used) for the duration of the COVID-19 declaration under Section  564(b)(1) of the Act, 21 U.S.C. section 360bbb-3(b)(1), unless the authorization is terminated or revoked.  Performed at [facility], [address] [place], [place] [address]       Radiology Studies: No results found.   Scheduled Meds: . apixaban  2.5 mg Oral BID  . buPROPion  100 mg Oral Daily  . donepezil  10 mg Oral QHS  . levothyroxine  50 mcg Oral Q0600  . metoprolol succinate  50 mg Oral Daily  . sertraline  100 mg Oral Daily   Continuous Infusions:   LOS: 2 days     Darlin Priestly, MD [facility] If 7PM-7AM, please contact night-coverage 04/27/2020, 2:57 AM

## 2020-04-27 LAB — BASIC METABOLIC PANEL
Anion gap: 9 (ref 5–15)
BUN: 27 mg/dL — ABNORMAL HIGH (ref 8–23)
CO2: 23 mmol/L (ref 22–32)
Calcium: 8.4 mg/dL — ABNORMAL LOW (ref 8.9–10.3)
Chloride: 107 mmol/L (ref 98–111)
Creatinine, Ser: 1.23 mg/dL — ABNORMAL HIGH (ref 0.44–1.00)
GFR, Estimated: 42 mL/min — ABNORMAL LOW (ref >60)
Glucose, Bld: 99 mg/dL (ref 70–99)
Potassium: 3.6 mmol/L (ref 3.5–5.1)
Sodium: 139 mmol/L (ref 135–145)

## 2020-04-27 LAB — CBC
HCT: 34.1 % — ABNORMAL LOW (ref 36.0–46.0)
Hemoglobin: 11.1 g/dL — ABNORMAL LOW (ref 12.0–15.0)
MCH: 30.2 pg (ref 26.0–34.0)
MCHC: 32.6 g/dL (ref 30.0–36.0)
MCV: 92.7 fL (ref 80.0–100.0)
Platelets: 204 10*3/uL (ref 150–400)
RBC: 3.68 MIL/uL — ABNORMAL LOW (ref 3.87–5.11)
RDW: 15.7 % — ABNORMAL HIGH (ref 11.5–15.5)
WBC: 5.8 10*3/uL (ref 4.0–10.5)
nRBC: 0 % (ref 0.0–0.2)

## 2020-04-27 LAB — MAGNESIUM: Magnesium: 1.9 mg/dL (ref 1.7–2.4)

## 2020-04-27 MED ORDER — FUROSEMIDE 40 MG PO TABS: ORAL_TABLET | ORAL | 0 refills | Status: DC

## 2020-04-27 MED ORDER — POTASSIUM CHLORIDE CRYS ER 20 MEQ PO TBCR: EXTENDED_RELEASE_TABLET | ORAL | Status: AC

## 2020-04-27 MED ORDER — LOSARTAN POTASSIUM 100 MG PO TABS: ORAL_TABLET | ORAL | Status: AC

## 2020-04-27 MED ORDER — APIXABAN 2.5 MG PO TABS: 2.5000 mg | ORAL_TABLET | Freq: Two times a day (BID) | ORAL | 0 refills | Status: AC

## 2020-04-27 NOTE — Discharge Summary (Signed)
Physician Discharge Summary   Deborah Martinez  female DOB: 07-10-1927  WUJ:[id number]  PCP: Jerl MinaHedrick, James, MD  Admit date: 04/25/2020 Discharge date: 04/27/2020  Admitted From: ALF Disposition:  ALF Discussed with son about discharge plans prior to discharge.  Home Health: Yes CODE STATUS: Full code  Discharge Instructions    Discharge instructions   Complete by: As directed    You do not have an urinary track infection.  Your weakness is likely due to dehydration and poor oral intake.  Please be sure to drink fluids regularly.    Your Eliquis dose has been reduced from 5 mg twice a day to 2.5 mg twice a day based on your age and kidney function.  Please hold your Lasix, Cozaar and potassium supplement until followup with your outpatient doctor.   Dr. Darlin Priestlyina [facility]- -       Hospital Course:  For full details, please see H&P, progress notes, consult notes and ancillary notes.  Briefly,  Deborah Martinez a 87 y.o.femalewith medical history significant fordementia, chronic anxiety/depression, iron deficiency anemia, essential hypertension, permanent Afib on Eliquis, hyperlipidemia,chronic systolic CHF, hypothyroidism, recurrent UTI, dizziness, who [facility]Fmemory carewith complaints of generalized weakness, lightheadedness,and poor oral intake.   UTI, ruled out --no urinary symptoms.  Urine cx neg growth.  Ceftriaxone x1 given in the ED, then abx d/c'ed.  AKI likely prerenal in the setting of dehydration. Presented with creatinine of 1.3 with GFR of 39.  Creatinine at baseline 0.7 with GFR greater than 60. Received gentle IV fluid hydration normal saline at 30 cc/h x 1 day. --will need scheduled fluid feeding at ALF --Home Lasix, Cozaar and potassium supplement held pending outpatient followup.  Paroxysmal A. fib on Eliquis --cont home Toprol. --Home Eliquis dose was reduced from 5 mg BID to 2.5 mg BID by pharm based on age and kidney  function.  Chronic systolic CHF Last 2D echo done on 03/23/19 revealed LVEF 25-30%. Moderate to severe TV regurgitation --home lasix held pending outpatient followup due to AKI and poor oral hydration.  Chronic iron deficiency anemia Hg >11, likely hemoconcentration from dehydration Baseline Hg 9  Lightheadedness  Orthostatic neg Fallprecautions  Dementia Poor oral intake --likely progression of dementia, with decreased interest in oral intake and more time spend sleeping.  Pt said, however, that she would eat and drink if it's offered to her.   --will need scheduled liquid feeding at ALF   Discharge Diagnoses:  Active Problems:   UTI (urinary tract infection)   30 Day Unplanned Readmission Risk Score   Flowsheet Row ED to Hosp-Admission (Current) from [facility]/2020 [facility] (1C)  30 Day Unplanned Readmission Risk Score (%) 18.59 Filed at 04/27/2020 0801     This score is the patient's risk of an unplanned readmission within 30 days of being discharged (0 -100%). The score is based on dignosis, age, lab data, medications, orders, and past utilization.   Low:  0-14.9   Medium: 15-21.9   High: 22-29.9   Extreme: 30 and above        Discharge Instructions:  Allergies as of 04/27/2020   No Known Allergies     Medication List    STOP taking these medications   ferrous sulfate 325 (65 FE) MG tablet     TAKE these medications   acetaminophen 325 MG tablet Commonly known as: TYLENOL Take 2 tablets (650 mg total) by mouth every 6 (six) hours as needed  for mild pain or fever. What changed: additional instructions   alendronate 70 MG tablet Commonly known as: FOSAMAX Take 70 mg by mouth once a week. Take with a full glass of water on an empty stomach.   apixaban 2.5 MG Tabs tablet Commonly known as: ELIQUIS Take 1 tablet (2.5 mg total) by mouth 2 (two) times daily. What changed:   medication strength  how much to take    buPROPion 100 MG 12 hr tablet Commonly known as: WELLBUTRIN SR Take 100 mg by mouth daily.   calcium carbonate 1500 (600 Ca) MG Tabs tablet Commonly known as: OSCAL Take 600 mg by mouth in the morning and at bedtime.   calcium carbonate 500 MG chewable tablet Commonly known as: TUMS - dosed in mg elemental calcium Chew 2 tablets (400 mg of elemental calcium total) by mouth 2 (two) times daily as needed for indigestion or heartburn.   docusate sodium 100 MG capsule Commonly known as: COLACE Take 1 capsule (100 mg total) by mouth 2 (two) times daily.   donepezil 10 MG tablet Commonly known as: ARICEPT Take 10 mg by mouth at bedtime.   ergocalciferol 1.25 MG (50000 UT) capsule Commonly known as: VITAMIN D2 Take 5,000 Units by mouth every 30 (thirty) days.   feeding supplement (PRO-STAT SUGAR FREE 64) Liqd Take 30 mLs by mouth daily.   feeding supplement Liqd Take 237 mLs by mouth 2 (two) times daily between meals. What changed: additional instructions   Fish Oil 1000 MG Caps Take 1,000 mg by mouth daily.   furosemide 40 MG tablet Commonly known as: LASIX Hold until outpatient followup due to acute kidney injury and poor oral hydration. What changed:   how much to take  how to take this  when to take this  additional instructions   levothyroxine 50 MCG tablet Commonly known as: SYNTHROID Take 50 mcg by mouth daily.   losartan 100 MG tablet Commonly known as: COZAAR Hold until outpatient followup due to acute kidney injury. What changed:   how much to take  how to take this  when to take this  additional instructions   meclizine 12.5 MG tablet Commonly known as: ANTIVERT Take 1 tablet (12.5 mg total) by mouth 3 (three) times daily as needed for dizziness.   metoprolol succinate 50 MG 24 hr tablet Commonly known as: TOPROL-XL Take 1 tablet (50 mg total) by mouth daily.   phenylephrine-shark liver oil-mineral oil-petrolatum 0.25-3-14-71.9 % rectal  ointment Commonly known as: PREPARATION H Place 1 application rectally 4 (four) times daily as needed for hemorrhoids.   potassium chloride SA 20 MEQ tablet Commonly known as: KLOR-CON Hold while not taking Lasix. What changed:   how much to take  how to take this  when to take this  additional instructions   senna-docusate 8.6-50 MG tablet Commonly known as: Senokot-S Take 1 tablet by mouth at bedtime as needed for moderate constipation.   sertraline 100 MG tablet Commonly known as: ZOLOFT Take 100 mg by mouth daily.        Follow-up Information    [name], MD. Schedule an appointment as soon as possible for a visit in 1 week(s).   Specialty: Family Medicine Contact information: [address] [facility] [place] [address] [phone]               No Known Allergies   The results of significant diagnostics from this hospitalization (including imaging, microbiology, ancillary and laboratory) are listed below  for reference.   Consultations:   Procedures/Studies: No results found.    Labs: BNP (last 3 results) No results for input(s): BNP in the last 8760 hours. Basic Metabolic Panel: Recent Labs  Lab 04/25/20 1329 04/25/20 1604 04/26/20 0422 04/27/20 0459  NA 136  --  138 139  K 4.3  --  4.1 3.6  CL 103  --  106 107  CO2 23  --  22 23  GLUCOSE 106*  --  122* 99  BUN 27*  --  30* 27*  CREATININE 1.30* 1.19* 1.18* 1.23*  CALCIUM 8.5*  --  8.5* 8.4*  MG  --   --  1.9 1.9  PHOS  --   --  3.3  --    Liver Function Tests: Recent Labs  Lab 04/26/20 0422  AST 111*  ALT 136*  ALKPHOS 158*  BILITOT 0.7  PROT 5.6*  ALBUMIN 3.0*   No results for input(s): LIPASE, AMYLASE in the last 168 hours. No results for input(s): AMMONIA in the last 168 hours. CBC: Recent Labs  Lab 04/25/20 1329 04/25/20 1604 04/26/20 0422 04/27/20 0459  WBC 5.3 8.8 6.4 5.8  NEUTROABS  --   --  4.9  --   HGB 11.2* 11.3* 10.9* 11.1*  HCT  33.7* 34.5* 33.2* 34.1*  MCV 90.3 91.3 91.5 92.7  PLT 216 233 192 204   Cardiac Enzymes: No results for input(s): CKTOTAL, CKMB, CKMBINDEX, TROPONINI in the last 168 hours. BNP: Invalid input(s): POCBNP CBG: Recent Labs  Lab 04/25/20 2234  GLUCAP 131*   D-Dimer No results for input(s): DDIMER in the last 72 hours. Hgb A1c No results for input(s): HGBA1C in the last 72 hours. Lipid Profile No results for input(s): CHOL, HDL, LDLCALC, TRIG, CHOLHDL, LDLDIRECT in the last 72 hours. Thyroid function studies No results for input(s): TSH, T4TOTAL, T3FREE, THYROIDAB in the last 72 hours.  Invalid input(s): FREET3 Anemia work up No results for input(s): VITAMINB12, FOLATE, FERRITIN, TIBC, IRON, RETICCTPCT in the last 72 hours. Urinalysis    Component Value Date/Time   COLORURINE YELLOW (A) 04/25/2020 1400   APPEARANCEUR HAZY (A) 04/25/2020 1400   LABSPEC 1.017 04/25/2020 1400   PHURINE 5.0 04/25/2020 1400   GLUCOSEU NEGATIVE 04/25/2020 1400   HGBUR NEGATIVE 04/25/2020 1400   BILIRUBINUR NEGATIVE 04/25/2020 1400   BILIRUBINUR neg 01/22/2017 1445   KETONESUR NEGATIVE 04/25/2020 1400   PROTEINUR NEGATIVE 04/25/2020 1400   UROBILINOGEN 0.2 01/22/2017 1445   NITRITE NEGATIVE 04/25/2020 1400   LEUKOCYTESUR MODERATE (A) 04/25/2020 1400   Sepsis Labs Invalid input(s): PROCALCITONIN,  WBC,  LACTICIDVEN Microbiology Recent Results (from the past 240 hour(s))  Urine Culture     Status: None   Collection Time: 04/25/20  2:00 PM   Specimen: Urine, Catheterized  Result Value Ref Range Status   Specimen Description   Final    URINE, CATHETERIZED Performed at [facility], [address], [place], [place] [address]    Special Requests   Final    NONE Performed at [facility], [address] [place], [place] [address]    Culture   Final    NO GROWTH Performed at [facility], [address], [place], [place] [address]    Report Status 04/26/2020 FINAL   Final  Resp Panel by RT-PCR (Flu A&B, Covid) Nasopharyngeal Swab     Status: None   Collection Time: 04/25/20  4:03 PM   Specimen: Nasopharyngeal Swab; Nasopharyngeal(NP) swabs in vial transport  medium  Result Value Ref Range Status   SARS Coronavirus 2 by RT PCR NEGATIVE NEGATIVE Final    Comment: (NOTE) SARS-CoV-2 target nucleic acids are NOT DETECTED.  The SARS-CoV-2 RNA is generally detectable in upper respiratory specimens during the acute phase of infection. The lowest concentration of SARS-CoV-2 viral copies this assay can detect is 138 copies/mL. A negative result does not preclude SARS-Cov-2 infection and should not be used as the sole basis for treatment or other patient management decisions. A negative result may occur with  improper specimen collection/handling, submission of specimen other than nasopharyngeal swab, presence of viral mutation(s) within the areas targeted by this assay, and inadequate number of viral copies(<138 copies/mL). A negative result must be combined with clinical observations, patient history, and epidemiological information. The expected result is Negative.  Fact Sheet for Patients:  BloggerCourse.com  Fact Sheet for Healthcare Providers:  SeriousBroker.it  This test is no t yet approved or cleared by the [place] FDA and  has been authorized for detection and/or diagnosis of SARS-CoV-2 by FDA under an Emergency Use Authorization (EUA). This EUA will remain  in effect (meaning this test can be used) for the duration of the COVID-19 declaration under Section 564(b)(1) of the Act, 21 U.S.C.section 360bbb-3(b)(1), unless the authorization is terminated  or revoked sooner.       Influenza A by PCR NEGATIVE NEGATIVE Final   Influenza B by PCR NEGATIVE NEGATIVE Final    Comment: (NOTE) The Xpert Xpress SARS-CoV-2/FLU/RSV plus assay is intended as an aid in the diagnosis of influenza from  Nasopharyngeal swab specimens and should not be used as a sole basis for treatment. Nasal washings and aspirates are unacceptable for Xpert Xpress SARS-CoV-2/FLU/RSV testing.  Fact Sheet for Patients: BloggerCourse.com  Fact Sheet for Healthcare Providers: SeriousBroker.it  This test is not yet approved or cleared by the [place] FDA and has been authorized for detection and/or diagnosis of SARS-CoV-2 by FDA under an Emergency Use Authorization (EUA). This EUA will remain in effect (meaning this test can be used) for the duration of the COVID-19 declaration under Section 564(b)(1) of the Act, 21 U.S.C. section 360bbb-3(b)(1), unless the authorization is terminated or revoked.  Performed at [facility], [address] [place], [place] [address]      Total time spend on discharging this patient, including the last patient exam, discussing the hospital stay, instructions for ongoing care as it relates to all pertinent caregivers, as well as preparing the medical discharge records, prescriptions, and/or referrals as applicable, is 45 minutes.    Darlin Priestly, MD  [facility] 04/27/2020, 9:14 AM

## 2020-04-27 NOTE — NC FL2 (Signed)
[place] MEDICAID FL2 LEVEL OF CARE SCREENING TOOL     IDENTIFICATION  Patient Name: Deborah Martinez Birthdate: 15-Apr-1927 Sex: female Admission Date (Current Location): 04/25/2020  [facility] and [place] Number:  Chiropodist and Address:  [facility], [address], [place], [place] [address]      Provider Number: [id number]  Attending Physician Name and Address:  Darlin Priestly, MD  Relative Name and Phone Number:  [name] (son)    Current Level of Care: Hospital Recommended Level of Care: Assisted Living Facility Prior Approval Number:    Date Approved/Denied:   PASRR Number:    Discharge Plan: Domiciliary (Rest home) (ALF)    Current Diagnoses: Patient Active Problem List   Diagnosis Date Noted  . Acute decompensated heart failure (HCC) 03/23/2019  . Hypothyroid 11/20/2018  . Protein calorie malnutrition (HCC) 11/20/2018  . AF (paroxysmal atrial fibrillation) (HCC) 11/20/2018  . Femur fracture, left (HCC) 11/19/2018  . UTI (urinary tract infection) 02/01/2017  . Chest pain 01/10/2017  . Dizziness 01/10/2017  . Ataxia 01/10/2017  . Cystocele, midline 07/27/2014  . Menopause 07/27/2014  . Vaginal atrophy 07/27/2014  . Incomplete bladder emptying 07/27/2014  . Anxiety and depression 07/27/2014  . Constipation 07/27/2014  . Hypertension 07/27/2014  . Hypercholesterolemia 07/27/2014  . Hemorrhoids 07/27/2014  . Rectocele 07/27/2014  . Vaginal enterocele 07/27/2014    Orientation RESPIRATION BLADDER Height & Weight     Self  Normal Incontinent Weight: 59 kg Height:  5\' 5"  (165.1 cm)  BEHAVIORAL SYMPTOMS/MOOD NEUROLOGICAL BOWEL NUTRITION STATUS      Incontinent Diet (Heart Healthy)  AMBULATORY STATUS COMMUNICATION OF NEEDS Skin   Supervision Verbally Normal                       Personal Care Assistance Level of Assistance  Bathing,Feeding,Dressing Bathing Assistance: Limited assistance Feeding assistance: Limited  assistance Dressing Assistance: Limited assistance     Functional Limitations Info             SPECIAL CARE FACTORS FREQUENCY  PT (By licensed PT),OT (By licensed OT)     PT Frequency: Home Health to eval and treat OT Frequency: home health to eval and treat            Contractures Contractures Info: Not present    Additional Factors Info  Code Status,Allergies Code Status Info: Full Allergies Info: NKA           Current Medications (04/27/2020):  This is the current hospital active medication list Current Facility-Administered Medications  Medication Dose Route Frequency Provider Last Rate Last Admin  . acetaminophen (TYLENOL) tablet 650 mg  650 mg Oral Q4H PRN Mansy, 04/29/2020, MD   650 mg at 04/26/20 2035  . alum & mag hydroxide-simeth (MAALOX/MYLANTA) 200-200-20 MG/5ML suspension 30 mL  30 mL Oral Q4H PRN Mansy, Jan A, MD      . apixaban Feb) tablet 2.5 mg  2.5 mg Oral BID Everlene Balls, RPH   2.5 mg at 04/27/20 04/29/20  . buPROPion (WELLBUTRIN SR) 12 hr tablet 100 mg  100 mg Oral Daily 3474 N, DO   100 mg at 04/27/20 0909  . donepezil (ARICEPT) tablet 10 mg  10 mg Oral QHS 04/29/20 N, DO   10 mg at 04/26/20 2035  . levothyroxine (SYNTHROID) tablet 50 mcg  50 mcg Oral Q0600 2036, DO   50 mcg at 04/27/20 0519  . metoprolol succinate (  TOPROL-XL) 24 hr tablet 50 mg  50 mg Oral Daily Dow Adolph N, DO   50 mg at 04/27/20 6659  . metoprolol tartrate (LOPRESSOR) injection 2.5 mg  2.5 mg Intravenous Q4H PRN Dow Adolph N, DO   2.5 mg at 04/25/20 2336  . ondansetron (ZOFRAN) injection 4 mg  4 mg Intravenous Q4H PRN Mansy, Jan A, MD   4 mg at 04/26/20 0322  . sertraline (ZOLOFT) tablet 100 mg  100 mg Oral Daily Dow Adolph N, DO   100 mg at 04/27/20 9357  . traZODone (DESYREL) tablet 25 mg  25 mg Oral QHS PRN Mansy, Jan A, MD   25 mg at 04/26/20 2035     Discharge Medications: Medication List    STOP taking these medications   ferrous sulfate  325 (65 FE) MG tablet     TAKE these medications   acetaminophen 325 MG tablet Commonly known as: TYLENOL Take 2 tablets (650 mg total) by mouth every 6 (six) hours as needed for mild pain or fever. What changed: additional instructions   alendronate 70 MG tablet Commonly known as: FOSAMAX Take 70 mg by mouth once a week. Take with a full glass of water on an empty stomach.   apixaban 2.5 MG Tabs tablet Commonly known as: ELIQUIS Take 1 tablet (2.5 mg total) by mouth 2 (two) times daily. What changed:   medication strength  how much to take   buPROPion 100 MG 12 hr tablet Commonly known as: WELLBUTRIN SR Take 100 mg by mouth daily.   calcium carbonate 1500 (600 Ca) MG Tabs tablet Commonly known as: OSCAL Take 600 mg by mouth in the morning and at bedtime.   calcium carbonate 500 MG chewable tablet Commonly known as: TUMS - dosed in mg elemental calcium Chew 2 tablets (400 mg of elemental calcium total) by mouth 2 (two) times daily as needed for indigestion or heartburn.   docusate sodium 100 MG capsule Commonly known as: COLACE Take 1 capsule (100 mg total) by mouth 2 (two) times daily.   donepezil 10 MG tablet Commonly known as: ARICEPT Take 10 mg by mouth at bedtime.   ergocalciferol 1.25 MG (50000 UT) capsule Commonly known as: VITAMIN D2 Take 5,000 Units by mouth every 30 (thirty) days.   feeding supplement (PRO-STAT SUGAR FREE 64) Liqd Take 30 mLs by mouth daily.   feeding supplement Liqd Take 237 mLs by mouth 2 (two) times daily between meals. What changed: additional instructions   Fish Oil 1000 MG Caps Take 1,000 mg by mouth daily.   furosemide 40 MG tablet Commonly known as: LASIX Hold until outpatient followup due to acute kidney injury and poor oral hydration. What changed:   how much to take  how to take this  when to take this  additional instructions   levothyroxine 50 MCG tablet Commonly known as: SYNTHROID Take 50  mcg by mouth daily.   losartan 100 MG tablet Commonly known as: COZAAR Hold until outpatient followup due to acute kidney injury. What changed:   how much to take  how to take this  when to take this  additional instructions   meclizine 12.5 MG tablet Commonly known as: ANTIVERT Take 1 tablet (12.5 mg total) by mouth 3 (three) times daily as needed for dizziness.   metoprolol succinate 50 MG 24 hr tablet Commonly known as: TOPROL-XL Take 1 tablet (50 mg total) by mouth daily.   phenylephrine-shark liver oil-mineral oil-petrolatum 0.25-3-14-71.9 % rectal ointment Commonly known  as: PREPARATION H Place 1 application rectally 4 (four) times daily as needed for hemorrhoids.   potassium chloride SA 20 MEQ tablet Commonly known as: KLOR-CON Hold while not taking Lasix. What changed:   how much to take  how to take this  when to take this  additional instructions   senna-docusate 8.6-50 MG tablet Commonly known as: Senokot-S Take 1 tablet by mouth at bedtime as needed for moderate constipation.   sertraline 100 MG tablet Commonly known as: ZOLOFT Take 100 mg by mouth daily.    Please see discharge summary for a list of discharge medications.  Relevant Imaging Results:  Relevant Lab Results:   Additional Information    Allayne Butcher, RN

## 2020-04-27 NOTE — Progress Notes (Signed)
Went through discharge paper with son , no question at this time. Patient left POV with her son

## 2020-04-27 NOTE — TOC Initial Note (Signed)
Transition of Care [facility]) - Initial/Assessment Note    Patient Details  Name: Deborah Martinez MRN: [id number] Date of Birth: 1927/06/11  Transition of Care [facility]) CM/SW Contact:    Allayne Butcher, RN Phone Number: 04/27/2020, 9:13 AM  Clinical Narrative:                 Patient admitted to the hospital with UTI.  RNCM was able to speak with patient's son, Iantha Fallen over the phone.   Patient is from [facility].  Patient walks with a walker at baseline.  PT and OT are recommending home health PT and OT at the ALF.  Iantha Fallen agrees to home health services at [facility].  Iantha Fallen reports that he believes [facility] uses [facility].  Message left with Selena Batten from [facility] about accepting referral.   Patient's son will pick her up today at discharge.  He reports he should be able to be here around 1pm.    Expected Discharge Plan: Assisted Living Barriers to Discharge: Barriers Resolved   Patient Goals and CMS Choice Patient states their goals for this hospitalization and ongoing recovery are:: Patient will return to [facility].gov Compare Post Acute Care list provided to:: Patient Represenative (must comment) Choice offered to / list presented to : Adult Children  Expected Discharge Plan and Services Expected Discharge Plan: Assisted Living   Discharge Planning Services: CM Consult Post Acute Care Choice: Home Health Living arrangements for the past 2 months: Assisted Living Facility Expected Discharge Date: 04/27/20               DME Arranged: N/A DME Agency: NA       HH Arranged: PT,OT HH Agency: [facility] Date HH Agency Contacted: 04/27/20 Time HH Agency Contacted: 0911 Representative spoke with at [facility] Agency: Cala Bradford  Prior Living Arrangements/Services Living arrangements for the past 2 months: Assisted Living Facility Lives with:: Facility Resident Patient language and need for interpreter reviewed:: Yes Do you feel safe going back to the place where  you live?: Yes      Need for Family Participation in Patient Care: Yes (Comment) (UTI) Care giver support system in place?: Yes (comment) (son and daughter in law) Current home services: DME (walker) Criminal Activity/Legal Involvement Pertinent to Current Situation/Hospitalization: No - Comment as needed  Activities of Daily Living Home Assistive Devices/Equipment: Environmental consultant (specify type) ADL Screening (condition at time of admission) Patient's cognitive ability adequate to safely complete daily activities?: No Is the patient deaf or have difficulty hearing?: Yes Does the patient have difficulty seeing, even when wearing glasses/contacts?: No Does the patient have difficulty concentrating, remembering, or making decisions?: Yes Patient able to express need for assistance with ADLs?: No Does the patient have difficulty dressing or bathing?: Yes Independently performs ADLs?: No Communication: Needs assistance Is this a change from baseline?: Pre-admission baseline Dressing (OT): Needs assistance Is this a change from baseline?: Pre-admission baseline Grooming: Needs assistance Is this a change from baseline?: Pre-admission baseline Feeding: Needs assistance Is this a change from baseline?: Pre-admission baseline Bathing: Needs assistance Is this a change from baseline?: Pre-admission baseline Toileting: Needs assistance Is this a change from baseline?: Pre-admission baseline In/Out Bed: Needs assistance Is this a change from baseline?: Pre-admission baseline Walks in Home: Needs assistance Is this a change from baseline?: Pre-admission baseline Does the patient have difficulty walking or climbing stairs?: Yes Weakness of Legs: Both Weakness of Arms/Hands: Both  Permission Sought/Granted Permission sought to share information with :  Case Manager,Family Surveyor, minerals granted to share information with : Yes, Verbal Permission Granted  Share  Information with NAME: Iantha Fallen  Permission granted to share info w AGENCY: [facility]  Permission granted to share info w Relationship: son     Emotional Assessment Appearance:: Appears stated age     Orientation: : Oriented to Self Alcohol / Substance Use: Not Applicable Psych Involvement: No (comment)  Admission diagnosis:  Dehydration [E86.0] Paroxysmal atrial fibrillation (HCC) [I48.0] UTI (urinary tract infection) [N39.0] Cystitis [N30.90] Dementia without behavioral disturbance, unspecified dementia type (HCC) [F03.90] Protein-calorie malnutrition, unspecified severity (HCC) [E46] Patient Active Problem List   Diagnosis Date Noted  . Acute decompensated heart failure (HCC) 03/23/2019  . Hypothyroid 11/20/2018  . Protein calorie malnutrition (HCC) 11/20/2018  . AF (paroxysmal atrial fibrillation) (HCC) 11/20/2018  . Femur fracture, left (HCC) 11/19/2018  . UTI (urinary tract infection) 02/01/2017  . Chest pain 01/10/2017  . Dizziness 01/10/2017  . Ataxia 01/10/2017  . Cystocele, midline 07/27/2014  . Menopause 07/27/2014  . Vaginal atrophy 07/27/2014  . Incomplete bladder emptying 07/27/2014  . Anxiety and depression 07/27/2014  . Constipation 07/27/2014  . Hypertension 07/27/2014  . Hypercholesterolemia 07/27/2014  . Hemorrhoids 07/27/2014  . Rectocele 07/27/2014  . Vaginal enterocele 07/27/2014   PCP:  [name], MD Pharmacy:   [facility] - [place], [place] - [address] [place] [address] Phone: [phone] Fax: [phone]     Social Determinants of Health (SDOH) Interventions    Readmission Risk Interventions No flowsheet data found.

## 2020-04-27 NOTE — Care Management Important Message (Signed)
Important Message  Patient Details  Name: Deborah Martinez MRN: [id number] Date of Birth: [birth year]   Medicare Important Message Given:  N/A - LOS <3 / Initial given by admissions     Deborah Martinez 04/27/2020, 7:56 AM

## 2020-05-10 ENCOUNTER — Emergency Department: Payer: Medicare Other

## 2020-05-10 ENCOUNTER — Inpatient Hospital Stay
Admission: EM | Admit: 2020-05-10 | Discharge: 2020-05-14 | DRG: 291 | Disposition: A | Discharge status: Skilled Nursing Facility | Payer: Medicare Other | Attending: Internal Medicine | Admitting: Internal Medicine

## 2020-05-10 DIAGNOSIS — D631 Anemia in chronic kidney disease: Secondary | ICD-10-CM | POA: Diagnosis present

## 2020-05-10 DIAGNOSIS — I5023 Acute on chronic systolic (congestive) heart failure: Secondary | ICD-10-CM

## 2020-05-10 DIAGNOSIS — Z79890 Hormone replacement therapy (postmenopausal): Secondary | ICD-10-CM

## 2020-05-10 DIAGNOSIS — I4821 Permanent atrial fibrillation: Secondary | ICD-10-CM | POA: Diagnosis present

## 2020-05-10 DIAGNOSIS — E1165 Type 2 diabetes mellitus with hyperglycemia: Secondary | ICD-10-CM | POA: Diagnosis not present

## 2020-05-10 DIAGNOSIS — I130 Hypertensive heart and chronic kidney disease with heart failure and stage 1 through stage 4 chronic kidney disease, or unspecified chronic kidney disease: Secondary | ICD-10-CM | POA: Diagnosis present

## 2020-05-10 DIAGNOSIS — F0390 Unspecified dementia without behavioral disturbance: Secondary | ICD-10-CM | POA: Diagnosis present

## 2020-05-10 DIAGNOSIS — Z833 Family history of diabetes mellitus: Secondary | ICD-10-CM

## 2020-05-10 DIAGNOSIS — F32A Depression, unspecified: Secondary | ICD-10-CM | POA: Diagnosis present

## 2020-05-10 DIAGNOSIS — E785 Hyperlipidemia, unspecified: Secondary | ICD-10-CM | POA: Diagnosis present

## 2020-05-10 DIAGNOSIS — F419 Anxiety disorder, unspecified: Secondary | ICD-10-CM | POA: Diagnosis present

## 2020-05-10 DIAGNOSIS — Z87440 Personal history of urinary (tract) infections: Secondary | ICD-10-CM

## 2020-05-10 DIAGNOSIS — I248 Other forms of acute ischemic heart disease: Secondary | ICD-10-CM | POA: Diagnosis present

## 2020-05-10 DIAGNOSIS — Z66 Do not resuscitate: Secondary | ICD-10-CM | POA: Diagnosis present

## 2020-05-10 DIAGNOSIS — D509 Iron deficiency anemia, unspecified: Secondary | ICD-10-CM | POA: Diagnosis present

## 2020-05-10 DIAGNOSIS — K649 Unspecified hemorrhoids: Secondary | ICD-10-CM | POA: Diagnosis present

## 2020-05-10 DIAGNOSIS — Z79899 Other long term (current) drug therapy: Secondary | ICD-10-CM | POA: Diagnosis not present

## 2020-05-10 DIAGNOSIS — Z20822 Contact with and (suspected) exposure to covid-19: Secondary | ICD-10-CM | POA: Diagnosis present

## 2020-05-10 DIAGNOSIS — E039 Hypothyroidism, unspecified: Secondary | ICD-10-CM | POA: Diagnosis present

## 2020-05-10 DIAGNOSIS — E1122 Type 2 diabetes mellitus with diabetic chronic kidney disease: Secondary | ICD-10-CM | POA: Diagnosis present

## 2020-05-10 DIAGNOSIS — E876 Hypokalemia: Secondary | ICD-10-CM | POA: Diagnosis not present

## 2020-05-10 DIAGNOSIS — R531 Weakness: Secondary | ICD-10-CM | POA: Diagnosis present

## 2020-05-10 DIAGNOSIS — Z87891 Personal history of nicotine dependence: Secondary | ICD-10-CM | POA: Diagnosis not present

## 2020-05-10 DIAGNOSIS — Z7901 Long term (current) use of anticoagulants: Secondary | ICD-10-CM | POA: Diagnosis not present

## 2020-05-10 DIAGNOSIS — Z8249 Family history of ischemic heart disease and other diseases of the circulatory system: Secondary | ICD-10-CM | POA: Diagnosis not present

## 2020-05-10 DIAGNOSIS — I081 Rheumatic disorders of both mitral and tricuspid valves: Secondary | ICD-10-CM | POA: Diagnosis present

## 2020-05-10 DIAGNOSIS — K5900 Constipation, unspecified: Secondary | ICD-10-CM | POA: Diagnosis present

## 2020-05-10 DIAGNOSIS — N1832 Chronic kidney disease, stage 3b: Secondary | ICD-10-CM | POA: Diagnosis present

## 2020-05-10 DIAGNOSIS — Z9114 Patient's other noncompliance with medication regimen: Secondary | ICD-10-CM

## 2020-05-10 DIAGNOSIS — N390 Urinary tract infection, site not specified: Secondary | ICD-10-CM

## 2020-05-10 DIAGNOSIS — I509 Heart failure, unspecified: Secondary | ICD-10-CM

## 2020-05-10 LAB — URINALYSIS, COMPLETE (UACMP) WITH MICROSCOPIC
Bacteria, UA: NONE SEEN
Bilirubin Urine: NEGATIVE
Glucose, UA: NEGATIVE mg/dL
Ketones, ur: NEGATIVE mg/dL
Nitrite: NEGATIVE
Protein, ur: NEGATIVE mg/dL
Specific Gravity, Urine: 1.023 (ref 1.005–1.030)
pH: 5 (ref 5.0–8.0)

## 2020-05-10 LAB — CBC WITH DIFFERENTIAL/PLATELET
Abs Immature Granulocytes: 0.06 10*3/uL (ref 0.00–0.07)
Basophils Absolute: 0 10*3/uL (ref 0.0–0.1)
Basophils Relative: 0 %
Eosinophils Absolute: 0 10*3/uL (ref 0.0–0.5)
Eosinophils Relative: 0 %
HCT: 35.5 % — ABNORMAL LOW (ref 36.0–46.0)
Hemoglobin: 11.4 g/dL — ABNORMAL LOW (ref 12.0–15.0)
Immature Granulocytes: 1 %
Lymphocytes Relative: 7 %
Lymphs Abs: 0.5 10*3/uL — ABNORMAL LOW (ref 0.7–4.0)
MCH: 28.9 pg (ref 26.0–34.0)
MCHC: 32.1 g/dL (ref 30.0–36.0)
MCV: 90.1 fL (ref 80.0–100.0)
Monocytes Absolute: 0.6 10*3/uL (ref 0.1–1.0)
Monocytes Relative: 9 %
Neutro Abs: 6.1 10*3/uL (ref 1.7–7.7)
Neutrophils Relative %: 83 %
Platelets: 252 10*3/uL (ref 150–400)
RBC: 3.94 MIL/uL (ref 3.87–5.11)
RDW: 14.7 % (ref 11.5–15.5)
WBC: 7.3 10*3/uL (ref 4.0–10.5)
nRBC: 0 % (ref 0.0–0.2)

## 2020-05-10 LAB — COMPREHENSIVE METABOLIC PANEL
ALT: 55 U/L — ABNORMAL HIGH (ref 0–44)
AST: 67 U/L — ABNORMAL HIGH (ref 15–41)
Albumin: 3.1 g/dL — ABNORMAL LOW (ref 3.5–5.0)
Alkaline Phosphatase: 125 U/L (ref 38–126)
Anion gap: 11 (ref 5–15)
BUN: 39 mg/dL — ABNORMAL HIGH (ref 8–23)
CO2: 24 mmol/L (ref 22–32)
Calcium: 9.8 mg/dL (ref 8.9–10.3)
Chloride: 100 mmol/L (ref 98–111)
Creatinine, Ser: 1.17 mg/dL — ABNORMAL HIGH (ref 0.44–1.00)
GFR, Estimated: 44 mL/min — ABNORMAL LOW (ref >60)
Glucose, Bld: 179 mg/dL — ABNORMAL HIGH (ref 70–99)
Potassium: 5 mmol/L (ref 3.5–5.1)
Sodium: 135 mmol/L (ref 135–145)
Total Bilirubin: 0.9 mg/dL (ref 0.3–1.2)
Total Protein: 6 g/dL — ABNORMAL LOW (ref 6.5–8.1)

## 2020-05-10 LAB — LACTIC ACID, PLASMA
Lactic Acid, Venous: 3.3 mmol/L (ref 0.5–1.9)
Lactic Acid, Venous: 2 mmol/L (ref 0.5–1.9)

## 2020-05-10 LAB — RESP PANEL BY RT-PCR (FLU A&B, COVID) ARPGX2
Influenza A by PCR: NEGATIVE
Influenza B by PCR: NEGATIVE
SARS Coronavirus 2 by RT PCR: NEGATIVE

## 2020-05-10 LAB — TROPONIN I (HIGH SENSITIVITY)
Troponin I (High Sensitivity): 44 ng/L — ABNORMAL HIGH (ref <18)
Troponin I (High Sensitivity): 54 ng/L — ABNORMAL HIGH (ref <18)

## 2020-05-10 LAB — BRAIN NATRIURETIC PEPTIDE: B Natriuretic Peptide: >4500 pg/mL — ABNORMAL HIGH (ref 0.0–100.0)

## 2020-05-10 MED ORDER — FUROSEMIDE 10 MG/ML IJ SOLN
20.0000 mg | Freq: Two times a day (BID) | INTRAMUSCULAR | Status: DC
  Administered 2020-05-10 – 2020-05-11 (×3): 20 mg via INTRAVENOUS
  Filled 2020-05-10 (×3): qty 2

## 2020-05-10 MED ORDER — INSULIN ASPART 100 UNIT/ML IJ SOLN
0.0000 [IU] | Freq: Three times a day (TID) | INTRAMUSCULAR | Status: DC
  Administered 2020-05-11 – 2020-05-13 (×5): 1 [IU] via SUBCUTANEOUS
  Administered 2020-05-14: 2 [IU] via SUBCUTANEOUS
  Filled 2020-05-10 (×6): qty 1

## 2020-05-10 MED ORDER — POTASSIUM CHLORIDE CRYS ER 20 MEQ PO TBCR
20.0000 meq | EXTENDED_RELEASE_TABLET | Freq: Every day | ORAL | Status: DC
  Administered 2020-05-11 – 2020-05-14 (×4): 20 meq via ORAL
  Filled 2020-05-10 (×4): qty 1

## 2020-05-10 MED ORDER — METOPROLOL SUCCINATE ER 25 MG PO TB24
12.5000 mg | ORAL_TABLET | Freq: Every day | ORAL | Status: DC
  Administered 2020-05-11 – 2020-05-14 (×4): 12.5 mg via ORAL
  Filled 2020-05-10 (×4): qty 1

## 2020-05-10 MED ORDER — ONDANSETRON HCL 4 MG/2ML IJ SOLN: 4.0000 mg | Freq: Four times a day (QID) | INTRAMUSCULAR | Status: DC | PRN

## 2020-05-10 MED ORDER — SODIUM CHLORIDE 0.9 % IV SOLN
1.0000 g | Freq: Every day | INTRAVENOUS | Status: DC
  Administered 2020-05-11 (×2): 1 g via INTRAVENOUS
  Filled 2020-05-10 (×2): qty 10
  Filled 2020-05-10 (×2): qty 1

## 2020-05-10 MED ORDER — FUROSEMIDE 10 MG/ML IJ SOLN
40.0000 mg | Freq: Once | INTRAMUSCULAR | Status: AC
  Administered 2020-05-10: 40 mg via INTRAVENOUS
  Filled 2020-05-10: qty 4

## 2020-05-10 MED ORDER — LEVOTHYROXINE SODIUM 50 MCG PO TABS
50.0000 ug | ORAL_TABLET | Freq: Every day | ORAL | Status: DC
  Administered 2020-05-11 – 2020-05-14 (×4): 50 ug via ORAL
  Filled 2020-05-10 (×4): qty 1

## 2020-05-10 MED ORDER — BUPROPION HCL ER (SR) 100 MG PO TB12
100.0000 mg | ORAL_TABLET | Freq: Every day | ORAL | Status: DC
  Administered 2020-05-11 – 2020-05-14 (×4): 100 mg via ORAL
  Filled 2020-05-10 (×4): qty 1

## 2020-05-10 MED ORDER — DONEPEZIL HCL 5 MG PO TABS
10.0000 mg | ORAL_TABLET | Freq: Every day | ORAL | Status: DC
  Administered 2020-05-11 – 2020-05-13 (×4): 10 mg via ORAL
  Filled 2020-05-10 (×6): qty 2

## 2020-05-10 MED ORDER — ACETAMINOPHEN 325 MG PO TABS: 650.0000 mg | ORAL_TABLET | Freq: Four times a day (QID) | ORAL | Status: DC | PRN

## 2020-05-10 MED ORDER — APIXABAN 2.5 MG PO TABS
2.5000 mg | ORAL_TABLET | Freq: Two times a day (BID) | ORAL | Status: DC
  Administered 2020-05-11 – 2020-05-14 (×8): 2.5 mg via ORAL
  Filled 2020-05-10 (×8): qty 1

## 2020-05-10 MED ORDER — CALCIUM CARBONATE 1250 (500 CA) MG PO TABS
500.0000 mg | ORAL_TABLET | Freq: Two times a day (BID) | ORAL | Status: DC
  Administered 2020-05-11 – 2020-05-14 (×7): 500 mg via ORAL
  Filled 2020-05-10 (×9): qty 1

## 2020-05-10 MED ORDER — SERTRALINE HCL 50 MG PO TABS
100.0000 mg | ORAL_TABLET | Freq: Every day | ORAL | Status: DC
  Administered 2020-05-11 – 2020-05-14 (×4): 100 mg via ORAL
  Filled 2020-05-10 (×4): qty 2

## 2020-05-10 MED ORDER — INSULIN ASPART 100 UNIT/ML IJ SOLN: 0.0000 [IU] | Freq: Every day | INTRAMUSCULAR | Status: DC

## 2020-05-10 NOTE — ED Triage Notes (Signed)
Patient arrived via EMS for possible UTI. Pt c/o burning when urinating and a strong smelling odor. However, facility called EMS because PCP noted "shallow breathing pattern." No shallow breathing noted during triage.

## 2020-05-10 NOTE — H&P (Addendum)
History and Physical  Deborah LenzBetty W Cuff MVH:[id number] DOB: 06-30-1927 DOA: 05/10/2020  Referring physician: Greig RightFisher, Susan, PA, EDP PCP: Jerl MinaHedrick, James, MD  Outpatient Specialists: Cardiology Patient coming from: Memory care/ALF.  Chief Complaint: Shallow breathing and dysuria.  HPI: Deborah Martinez is a 87 y.o. female with medical history significant for dementia, chronic systolic CHF, permanent A. fib on Eliquis, hyperlipidemia, essential hypertension, iron deficiency anemia, chronic anxiety/depression, hypothyroidism, recurrent UTI, who presented to [facility] ED from ALF memory care due to shallow breathing and reported dysuria, unclear duration.  History is mainly obtained from EDP, her son at bedside, and review of medical records.  Patient is pleasantly demented.  She was recently admitted to the hospital on 04/25/2020 due to generalized weakness and poor oral intake.  Her diuretics were held due to concern for dehydration.  Upon discharge on 04/27/2020 she was advised to follow-up with her PCP.  She has been off her diuretics since.  She was sent to the ED for further evaluation of her shortness of breath and reported dysuria.  Work-up in the ED revealed acute on chronic systolic CHF.  Also revealed presumptive UTI.  TRH, hospitalist team was asked to admit.  ED Course:  Afebrile.  BP 149/101, pulse 66, respiratory 18, O2 saturation 100% on room air.  Lab studies remarkable for serum glucose 179, BUN 29, creatinine 1.17, AST 67, ALT 55, BNP greater than 4500, troponin 44, repeated 54, lactic acid 3.3, repeated 2.0.  WBC 7.3, hemoglobin 11.4, platelet 252.  Twelve-lead EKG showing sinus rhythm rate of 70, nonspecific ST-T changes.  QTc 484.  Review of Systems: Review of systems as noted in the HPI. All other systems reviewed and are negative.   Past Medical History:  Diagnosis Date  . Anxiety   . Arrhythmia    atrial fibrillation  . CHF (congestive heart failure) (HCC)   . Constipation   . Cystocele    . Dementia (HCC)   . Depression   . Hemorrhoid   . Hypercholesterolemia   . Hypertension   . Hypothyroidism   . Incomplete bladder emptying   . Lymphedema   . Postmenopausal atrophic vaginitis   . Rectocele   . Thyroid disease   . UTI (lower urinary tract infection)   . Vaginal enterocele    Past Surgical History:  Procedure Laterality Date  . APPENDECTOMY    . CATARACT EXTRACTION W/PHACO Left 12/13/2019   Procedure: CATARACT EXTRACTION PHACO AND INTRAOCULAR LENS PLACEMENT (IOC) LEFT 7.99 00:45.3;  Surgeon: Galen ManilaPorfilio, William, MD;  Location: [facility] ORS;  Service: Ophthalmology;  Laterality: Left;  . EYE SURGERY  2012   remve cataract  . INTRAMEDULLARY (IM) NAIL INTERTROCHANTERIC Left 11/20/2018   Procedure: INTRAMEDULLARY (IM) NAIL INTERTROCHANTRIC;  Surgeon: Christena FlakePoggi, John J, MD;  Location: [facility] ORS;  Service: Orthopedics;  Laterality: Left;  . TOTAL ABDOMINAL HYSTERECTOMY  1977    Social History:  reports that she quit smoking about 52 years ago. Her smoking use included cigarettes. She has never used smokeless tobacco. She reports that she does not drink alcohol and does not use drugs.   No Known Allergies  Family History  Problem Relation Age of Onset  . Heart attack Father   . Diabetes Brother   . Cancer Neg Hx       Prior to Admission medications   Medication Sig Start Date End Date Taking? Authorizing Provider  acetaminophen (TYLENOL) 325 MG tablet Take 2 tablets (650 mg total) by mouth every 6 (six) hours as needed  for mild pain or fever. Patient taking differently: Take 650 mg by mouth every 6 (six) hours as needed for mild pain or fever. Do not exceed 3000mg  in 24 hours 11/23/18  Yes 13/10/20 A, DO  alendronate (FOSAMAX) 70 MG tablet Take 70 mg by mouth once a week. Take with a full glass of water on an empty stomach.   Yes [provider]  apixaban (ELIQUIS) 2.5 MG TABS tablet Take 1 tablet (2.5 mg total) by mouth 2 (two) times daily. 04/27/20 07/26/20  Yes 07/28/20, MD  buPROPion [facility] SR) 100 MG 12 hr tablet Take 100 mg by mouth daily. 10/14/18  Yes [provider]  calcium carbonate (OSCAL) 1500 (600 Ca) MG TABS tablet Take 600 mg by mouth in the morning and at bedtime.   Yes [provider]  docusate sodium (COLACE) 100 MG capsule Take 1 capsule (100 mg total) by mouth 2 (two) times daily. 11/23/18  Yes 13/10/20 A, DO  donepezil (ARICEPT) 10 MG tablet Take 10 mg by mouth at bedtime.  11/18/18  Yes [provider]  ergocalciferol (VITAMIN D2) 1.25 MG (50000 UT) capsule Take 5,000 Units by mouth every 30 (thirty) days.   Yes [provider]  levothyroxine (SYNTHROID, LEVOTHROID) 50 MCG tablet Take 50 mcg by mouth daily.    Yes [provider]  meclizine (ANTIVERT) 12.5 MG tablet Take 1 tablet (12.5 mg total) by mouth 3 (three) times daily as needed for dizziness. 02/03/17  Yes 02/05/17, MD  metoprolol succinate (TOPROL-XL) 50 MG 24 hr tablet Take 1 tablet (50 mg total) by mouth daily. 03/28/19 12/11/20 Yes Sreenath, 12/13/20, MD  Omega-3 Fatty Acids (FISH OIL) 1000 MG CAPS Take 1,000 mg by mouth daily.    Yes [provider]  sertraline (ZOLOFT) 100 MG tablet Take 100 mg by mouth daily. 08/20/18  Yes [provider]  Amino Acids-Protein Hydrolys (FEEDING SUPPLEMENT, PRO-STAT SUGAR FREE 64,) LIQD Take 30 mLs by mouth daily.    [provider]  calcium carbonate (TUMS - DOSED IN MG ELEMENTAL CALCIUM) 500 MG chewable tablet Chew 2 tablets (400 mg of elemental calcium total) by mouth 2 (two) times daily as needed for indigestion or heartburn. Patient not taking: Reported on 05/10/2020 11/23/18   13/10/20 A, DO  feeding supplement, ENSURE ENLIVE, (ENSURE ENLIVE) LIQD Take 237 mLs by mouth 2 (two) times daily between meals. Patient taking differently: Take 237 mLs by mouth 2 (two) times daily between meals. STRAWBERRY ONLY 11/23/18   13/10/20, DO   furosemide (LASIX) 40 MG tablet Hold until outpatient followup due to acute kidney injury and poor oral hydration. Patient not taking: Reported on 05/10/2020 04/27/20   04/29/20, MD  losartan (COZAAR) 100 MG tablet Hold until outpatient followup due to acute kidney injury. Patient not taking: Reported on 05/10/2020 04/27/20   04/29/20, MD  potassium chloride SA (KLOR-CON) 20 MEQ tablet Hold while not taking Lasix. Patient not taking: Reported on 05/10/2020 04/27/20   04/29/20, MD  senna-docusate (SENOKOT-S) 8.6-50 MG tablet Take 1 tablet by mouth at bedtime as needed for moderate constipation. Patient not taking: Reported on 05/10/2020 11/23/18   13/10/20, DO    Physical Exam: BP (!) 149/106 (BP Location: Left Arm)   Pulse 66   Temp 98 F (36.7 C) (Oral)   Resp 18   Ht 5\' 5"  (1.651 m)   Wt 59 kg   SpO2 100%  BMI 21.64 kg/m   . General: 87 y.o. year-old female well developed well nourished in no acute distress.  Alert and pleasantly confused. . Cardiovascular: Irregular rate and rhythm with no rubs or gallops.  No thyromegaly or JVD noted.  No lower extremity edema. 2/4 pulses in all 4 extremities. Marland Kitchen Respiratory: Mild rales bilaterally.  No wheezes noted.  Poor inspiratory effort. . Abdomen: Soft nontender nondistended with normal bowel sounds x4 quadrants. . Muskuloskeletal: No cyanosis, clubbing or edema noted bilaterally . Neuro: CN II-XII intact, strength, sensation, reflexes . Skin: No ulcerative lesions noted or rashes . Psychiatry: Judgement and insight appear altered. Mood is appropriate for condition and setting          Labs on Admission:  Basic Metabolic Panel: Recent Labs  Lab 05/10/20 1124  NA 135  K 5.0  CL 100  CO2 24  GLUCOSE 179*  BUN 39*  CREATININE 1.17*  CALCIUM 9.8   Liver Function Tests: Recent Labs  Lab 05/10/20 1124  AST 67*  ALT 55*  ALKPHOS 125  BILITOT 0.9  PROT 6.0*  ALBUMIN 3.1*   No results for input(s): LIPASE, AMYLASE in  the last 168 hours. No results for input(s): AMMONIA in the last 168 hours. CBC: Recent Labs  Lab 05/10/20 1124  WBC 7.3  NEUTROABS 6.1  HGB 11.4*  HCT 35.5*  MCV 90.1  PLT 252   Cardiac Enzymes: No results for input(s): CKTOTAL, CKMB, CKMBINDEX, TROPONINI in the last 168 hours.  BNP (last 3 results) Recent Labs    05/10/20 1124  BNP >4,500.0*    ProBNP (last 3 results) No results for input(s): PROBNP in the last 8760 hours.  CBG: No results for input(s): GLUCAP in the last 168 hours.  Radiological Exams on Admission: DG Chest Portable 1 View  Result Date: 05/10/2020 CLINICAL DATA:  Burning on urination. EXAM: PORTABLE CHEST 1 VIEW COMPARISON:  Single-view of the chest 05/26/2019. FINDINGS: There is cardiomegaly without edema. No consolidative process, pneumothorax or effusion. Aortic atherosclerosis. No acute or focal bony abnormality. IMPRESSION: No acute disease. Cardiomegaly. Aortic Atherosclerosis (ICD10-I70.0). Electronically Signed   By: Drusilla Kanner M.D.   On: 05/10/2020 12:25    EKG: I independently viewed the EKG done and my findings are as followed:   Assessment/Plan Present on Admission: . Acute on chronic systolic (congestive) heart failure (HCC)  Active Problems:   Acute on chronic systolic (congestive) heart failure (HCC)  Acute on chronic systolic CHF Has been off her diuretics due to poor oral intake with concern for dehydration Presented with reported shallow breathing, not hypoxic, elevated BNP greater than 4500 Personally reviewed chest x-ray done on admission which shows increasing pulmonary vascularity suggestive of pulmonary edema. Ongoing diuresing with IV Lasix 20 mg twice daily. Start strict I's and O's and daily weight Obtain 2D echo, follow results  Elevated troponin, suspect demand ischemia in the setting of acute illness Initial troponin 44, uptrending 54 Repeat troponin Follow 2D echo Repeat twelve-lead EKG in the  morning  Presumptive UTI Reported dysuria, patient is currently confused and not able to provide history at the time of this visit. UA positive for pyuria Follow urine culture Rocephin started empirically, de-escalate antibiotics with ID and sensitivities.  Elevated liver chemistries, improved from previous lab done on 04/26/2020. AST ALT still elevated but downtrending. Avoid hepatotoxic agents Monitor for now  Type 2 diabetes with hyperglycemia Obtain hemoglobin A1c Sensitive insulin sliding scale. Avoid hypoglycemia.  Essential hypertension BP is not at goal  Resume home Toprol-XL Monitor vital signs  Chronic normocytic anemia Hemoglobin stable at 11.4. No overt bleeding.  Permanent A. fib, rate controlled Resume home Toprol-XL for rate control Resume Eliquis for CVA prevention Monitor on telemetry.  Generalized weakness PT OT to assess Fall precautions  Chronic anxiety/depression/hypothyroidism/dementia Resume home regimen  DVT prophylaxis: Eliquis.  Code Status: DNR as stated by her son at bedside.  Family Communication: Son at bedside.  All questions answered to the best of my ability.  Disposition Plan: Admit to progressive cardiac unit.  Consults called: Cardiology.  Admission status: Status.  Patient will require at least 2 midnights for further evaluation and treatment of present condition.   Status is: Inpatient    Dispo:  Patient From: ALF  Planned Disposition: ALF possibly on 05/12/2020 or when cardiologist signs off.  Medically stable for discharge: No, ongoing management of acute on chronic systolic CHF, ongoing diuresing.         Darlin Drop MD [facility] Pager [phone]  If 7PM-7AM, please contact night-coverage [email or web address] Password [facility]  05/10/2020, 11:02 PM

## 2020-05-10 NOTE — ED Provider Notes (Signed)
[facility] Emergency Department Provider Note  ____________________________________________   Event Date/Time   First MD Initiated Contact with Patient 05/10/20 1126     (approximate)  I have reviewed the triage vital signs and the nursing notes.   HISTORY  Chief Complaint Possible UTI    HPI Deborah Martinez is a 87 y.o. female presents emergency department via EMS.  The nursing home states they think she has a UTI and that she has had some shallow breathing.  Patient had complained of burning with urination told that her urine had strong odor.  She denies chest pain.  Patient is also a poor historian    Past Medical History:  Diagnosis Date  . Anxiety   . Arrhythmia    atrial fibrillation  . CHF (congestive heart failure) (HCC)   . Constipation   . Cystocele   . Dementia (HCC)   . Depression   . Hemorrhoid   . Hypercholesterolemia   . Hypertension   . Hypothyroidism   . Incomplete bladder emptying   . Lymphedema   . Postmenopausal atrophic vaginitis   . Rectocele   . Thyroid disease   . UTI (lower urinary tract infection)   . Vaginal enterocele     Patient Active Problem List   Diagnosis Date Noted  . Acute on chronic systolic (congestive) heart failure (HCC) 05/10/2020  . Acute decompensated heart failure (HCC) 03/23/2019  . Hypothyroid 11/20/2018  . Protein calorie malnutrition (HCC) 11/20/2018  . AF (paroxysmal atrial fibrillation) (HCC) 11/20/2018  . Femur fracture, left (HCC) 11/19/2018  . UTI (urinary tract infection) 02/01/2017  . Chest pain 01/10/2017  . Dizziness 01/10/2017  . Ataxia 01/10/2017  . Cystocele, midline 07/27/2014  . Menopause 07/27/2014  . Vaginal atrophy 07/27/2014  . Incomplete bladder emptying 07/27/2014  . Anxiety and depression 07/27/2014  . Constipation 07/27/2014  . Hypertension 07/27/2014  . Hypercholesterolemia 07/27/2014  . Hemorrhoids 07/27/2014  . Rectocele 07/27/2014  . Vaginal enterocele  07/27/2014    Past Surgical History:  Procedure Laterality Date  . APPENDECTOMY    . CATARACT EXTRACTION W/PHACO Left 12/13/2019   Procedure: CATARACT EXTRACTION PHACO AND INTRAOCULAR LENS PLACEMENT (IOC) LEFT 7.99 00:45.3;  Surgeon: Galen Manila, MD;  Location: [facility] ORS;  Service: Ophthalmology;  Laterality: Left;  . EYE SURGERY  2012   remve cataract  . INTRAMEDULLARY (IM) NAIL INTERTROCHANTERIC Left 11/20/2018   Procedure: INTRAMEDULLARY (IM) NAIL INTERTROCHANTRIC;  Surgeon: Christena Flake, MD;  Location: [facility] ORS;  Service: Orthopedics;  Laterality: Left;  . TOTAL ABDOMINAL HYSTERECTOMY  1977    Prior to Admission medications   Medication Sig Start Date End Date Taking? Authorizing Provider  acetaminophen (TYLENOL) 325 MG tablet Take 2 tablets (650 mg total) by mouth every 6 (six) hours as needed for mild pain or fever. Patient taking differently: Take 650 mg by mouth every 6 (six) hours as needed for mild pain or fever. Do not exceed 3000mg  in 24 hours 11/23/18   13/10/20 A, DO  alendronate (FOSAMAX) 70 MG tablet Take 70 mg by mouth once a week. Take with a full glass of water on an empty stomach.    [provider]  Amino Acids-Protein Hydrolys (FEEDING SUPPLEMENT, PRO-STAT SUGAR FREE 64,) LIQD Take 30 mLs by mouth daily.    [provider]  apixaban (ELIQUIS) 2.5 MG TABS tablet Take 1 tablet (2.5 mg total) by mouth 2 (two) times daily. 04/27/20 07/26/20  07/28/20, MD  buPROPion [employer] SR)  100 MG 12 hr tablet Take 100 mg by mouth daily. 10/14/18   [provider]  calcium carbonate (OSCAL) 1500 (600 Ca) MG TABS tablet Take 600 mg by mouth in the morning and at bedtime.    [provider]  calcium carbonate (TUMS - DOSED IN MG ELEMENTAL CALCIUM) 500 MG chewable tablet Chew 2 tablets (400 mg of elemental calcium total) by mouth 2 (two) times daily as needed for indigestion or heartburn. 11/23/18   Pennie BanterGriffith, Kelly A, DO  docusate sodium  (COLACE) 100 MG capsule Take 1 capsule (100 mg total) by mouth 2 (two) times daily. 11/23/18   Esaw GrandchildGriffith, Kelly A, DO  donepezil (ARICEPT) 10 MG tablet Take 10 mg by mouth at bedtime.  11/18/18   [provider]  ergocalciferol (VITAMIN D2) 1.25 MG (50000 UT) capsule Take 5,000 Units by mouth every 30 (thirty) days.    [provider]  feeding supplement, ENSURE ENLIVE, (ENSURE ENLIVE) LIQD Take 237 mLs by mouth 2 (two) times daily between meals. Patient taking differently: Take 237 mLs by mouth 2 (two) times daily between meals. STRAWBERRY ONLY 11/23/18   Pennie BanterGriffith, Kelly A, DO  furosemide (LASIX) 40 MG tablet Hold until outpatient followup due to acute kidney injury and poor oral hydration. 04/27/20   Darlin PriestlyLai, Tina, MD  levothyroxine (SYNTHROID, LEVOTHROID) 50 MCG tablet Take 50 mcg by mouth daily.     [provider]  losartan (COZAAR) 100 MG tablet Hold until outpatient followup due to acute kidney injury. 04/27/20   Darlin PriestlyLai, Tina, MD  meclizine (ANTIVERT) 12.5 MG tablet Take 1 tablet (12.5 mg total) by mouth 3 (three) times daily as needed for dizziness. 02/03/17   Katha HammingKonidena, Snehalatha, MD  metoprolol succinate (TOPROL-XL) 50 MG 24 hr tablet Take 1 tablet (50 mg total) by mouth daily. 03/28/19 12/11/20  Tresa MooreSreenath, Sudheer B, MD  Omega-3 Fatty Acids (FISH OIL) 1000 MG CAPS Take 1,000 mg by mouth daily.     [provider]  phenylephrine-shark liver oil-mineral oil-petrolatum (PREPARATION H) 0.25-3-14-71.9 % rectal ointment Place 1 application rectally 4 (four) times daily as needed for hemorrhoids.    [provider]  potassium chloride SA (KLOR-CON) 20 MEQ tablet Hold while not taking Lasix. 04/27/20   Darlin PriestlyLai, Tina, MD  senna-docusate (SENOKOT-S) 8.6-50 MG tablet Take 1 tablet by mouth at bedtime as needed for moderate constipation. 11/23/18   Esaw GrandchildGriffith, Kelly A, DO  sertraline (ZOLOFT) 100 MG tablet Take 100 mg by mouth daily. 08/20/18   [provider]     Allergies Patient has no known allergies.  Family History  Problem Relation Age of Onset  . Heart attack Father   . Diabetes Brother   . Cancer Neg Hx     Social History Social History   Tobacco Use  . Smoking status: Former Smoker    Types: Cigarettes    Quit date: 1970    Years since quitting: 52.3  . Smokeless tobacco: Never Used  Vaping Use  . Vaping Use: Never used  Substance Use Topics  . Alcohol use: No  . Drug use: No    Review of Systems Unable to review due to patient being a poor historian Constitutional: No fever/chills Eyes: No visual changes. ENT: No sore throat. Respiratory: Denies cough Cardiovascular: Denies chest pain Gastrointestinal: Denies abdominal pain Genitourinary: Negative for dysuria. Musculoskeletal: Negative for back pain. Skin: Negative for rash. Psychiatric: no mood changes,     ____________________________________________   PHYSICAL EXAM:  VITAL SIGNS: ED Triage  Vitals  Enc Vitals Group     BP 05/10/20 1126 (!) 139/98     Pulse Rate 05/10/20 1126 68     Resp 05/10/20 1126 19     Temp 05/10/20 1126 98.2 F (36.8 C)     Temp Source 05/10/20 1126 Oral     SpO2 05/10/20 1121 98 %     Weight 05/10/20 1127 130 lb 1.1 oz (59 kg)     Height 05/10/20 1127 5\' 5"  (1.651 m)     Head Circumference --      Peak Flow --      Pain Score 05/10/20 1127 0     Pain Loc --      Pain Edu? --      Excl. in GC? --     Constitutional: Alert and oriented. Well appearing and in no acute distress. Eyes: Conjunctivae are normal.  Head: Atraumatic. Nose: No congestion/rhinnorhea. Mouth/Throat: Mucous membranes are moist.   Neck:  supple no lymphadenopathy noted Cardiovascular: Normal rate, regular rhythm. Heart sounds are normal Respiratory: Normal respiratory effort.  No retractions, lungs with rhonchi in the left lower lung Abd: soft nontender bs normal all 4 quad GU: deferred Musculoskeletal: FROM all extremities, warm and well  perfused, 2+ pitting edema noted in the lower extremities bilaterally Neurologic:  Normal speech and language.  Skin:  Skin is warm, dry and intact. No rash noted. Psychiatric: Mood and affect are normal. Speech and behavior are normal.  ____________________________________________   LABS (all labs ordered are listed, but only abnormal results are displayed)  Labs Reviewed  COMPREHENSIVE METABOLIC PANEL - Abnormal; Notable for the following components:      Result Value   Glucose, Bld 179 (*)    BUN 39 (*)    Creatinine, Ser 1.17 (*)    Total Protein 6.0 (*)    Albumin 3.1 (*)    AST 67 (*)    ALT 55 (*)    GFR, Estimated 44 (*)    All other components within normal limits  BRAIN NATRIURETIC PEPTIDE - Abnormal; Notable for the following components:   B Natriuretic Peptide >4,500.0 (*)    All other components within normal limits  LACTIC ACID, PLASMA - Abnormal; Notable for the following components:   Lactic Acid, Venous 3.3 (*)    All other components within normal limits  CBC WITH DIFFERENTIAL/PLATELET - Abnormal; Notable for the following components:   Hemoglobin 11.4 (*)    HCT 35.5 (*)    Lymphs Abs 0.5 (*)    All other components within normal limits  URINALYSIS, COMPLETE (UACMP) WITH MICROSCOPIC - Abnormal; Notable for the following components:   Color, Urine AMBER (*)    APPearance CLOUDY (*)    Hgb urine dipstick SMALL (*)    Leukocytes,Ua MODERATE (*)    All other components within normal limits  TROPONIN I (HIGH SENSITIVITY) - Abnormal; Notable for the following components:   Troponin I (High Sensitivity) 44 (*)    All other components within normal limits  TROPONIN I (HIGH SENSITIVITY) - Abnormal; Notable for the following components:   Troponin I (High Sensitivity) 54 (*)    All other components within normal limits  URINE CULTURE  RESP PANEL BY RT-PCR (FLU A&B, COVID) [id number]  LACTIC ACID, PLASMA    ____________________________________________   ____________________________________________  RADIOLOGY  Chest x-ray  ____________________________________________   PROCEDURES  Procedure(s) performed: No  Procedures    ____________________________________________   INITIAL IMPRESSION / ASSESSMENT AND PLAN / ED  COURSE  Pertinent labs & imaging results that were available during my care of the patient were reviewed by me and considered in my medical decision making (see chart for details).   Patient is 87 year old female presents via EMS from a nursing facility.  See HPI.  Physical exam shows patient to be comfortable and sleeping.  She is confused when I asked her the same questions that nursing staff did.  She is denying that she had dysuria and said I did not say that.    DDx: CHF, UTI, dementia, sepsis   CBC, metabolic panel, BNP, troponin, lactic acid, UA, EKG, chest x-ray ordered  CBC is basically normal, comprehensive metabolic panel shows glucose of 179, BUN of 39, creatinine of 1.17, AST and ALT are slightly elevated but are near the patient's normal trend, troponins elevated at 54, BNP is greater than 4500, lactic acid is elevated at 3.3 UA shows moderate leuks with white blood cell clumps and calcium oxalate  Chest x-ray reviewed by me and confirmed by radiology.  Radiology states no acute findings however I do feel there is some fluid noted on the left side of the chest  Patient was given Lasix 40 mg IV for her congestive heart failure.  Patient's son is in the room.  He agrees for admission.  Patient is a DNR.  Paged hospitalist for admission. Hospitalist agrees to admit.  Patient is in stable condition at this time  Deborah Martinez was evaluated in Emergency Department on 05/10/2020 for the symptoms described in the history of present illness. She was evaluated in the context of the global COVID-19 pandemic, which necessitated consideration that the patient  might be at risk for infection with the SARS-CoV-2 virus that causes COVID-19. Institutional protocols and algorithms that pertain to the evaluation of patients at risk for COVID-19 are in a state of rapid change based on information released by regulatory bodies including the CDC and federal and state organizations. These policies and algorithms were followed during the patient's care in the ED.    As part of my medical decision making, I reviewed the following data within the electronic MEDICAL RECORD NUMBER History obtained from family, Nursing notes reviewed and incorporated, Labs reviewed , EKG interpreted see physician read, Old chart reviewed, Radiograph reviewed , Discussed with admitting physician , Evaluated by EM attending Dr. Fuller Plan, Notes from prior ED visits and [place] Controlled Substance Database  ____________________________________________   FINAL CLINICAL IMPRESSION(S) / ED DIAGNOSES  Final diagnoses:  Acute on chronic congestive heart failure, unspecified heart failure type (HCC)  Urinary tract infection without hematuria, site unspecified      NEW MEDICATIONS STARTED DURING THIS VISIT:  New Prescriptions   No medications on file     Note:  This document was prepared using Dragon voice recognition software and may include unintentional dictation errors.    Faythe Ghee, PA-C 05/10/20 1554    Concha Se, MD 05/11/20 [phone]

## 2020-05-11 ENCOUNTER — Inpatient Hospital Stay: Admit: 2020-05-11 | Payer: Medicare Other

## 2020-05-11 ENCOUNTER — Encounter: Payer: Self-pay | Admitting: Internal Medicine

## 2020-05-11 ENCOUNTER — Inpatient Hospital Stay
Admit: 2020-05-11 | Discharge: 2020-05-11 | Disposition: A | Discharge status: Home / Self Care | Payer: Medicare Other | Attending: Internal Medicine | Admitting: Internal Medicine

## 2020-05-11 DIAGNOSIS — I5023 Acute on chronic systolic (congestive) heart failure: Secondary | ICD-10-CM | POA: Diagnosis not present

## 2020-05-11 LAB — ECHOCARDIOGRAM COMPLETE
AR max vel: 1 cm2
AV Area VTI: 0.92 cm2
AV Area mean vel: 0.94 cm2
AV Mean grad: 6 mmHg
AV Peak grad: 10.9 mmHg
Ao pk vel: 1.65 m/s
Area-P 1/2: 3.36 cm2
Height: 65 in
MV M vel: 4.55 m/s
MV Peak grad: 82.8 mmHg
MV VTI: 1.23 cm2
S' Lateral: 4.5 cm
Weight: 2412.8 oz

## 2020-05-11 LAB — GLUCOSE, CAPILLARY
Glucose-Capillary: 111 mg/dL — ABNORMAL HIGH (ref 70–99)
Glucose-Capillary: 122 mg/dL — ABNORMAL HIGH (ref 70–99)
Glucose-Capillary: 91 mg/dL (ref 70–99)
Glucose-Capillary: 104 mg/dL — ABNORMAL HIGH (ref 70–99)
Glucose-Capillary: 84 mg/dL (ref 70–99)

## 2020-05-11 LAB — HEMOGLOBIN A1C
Hgb A1c MFr Bld: 5.8 % — ABNORMAL HIGH (ref 4.8–5.6)
Mean Plasma Glucose: 119.76 mg/dL

## 2020-05-11 LAB — BASIC METABOLIC PANEL
Anion gap: 14 (ref 5–15)
BUN: 36 mg/dL — ABNORMAL HIGH (ref 8–23)
CO2: 22 mmol/L (ref 22–32)
Calcium: 9.3 mg/dL (ref 8.9–10.3)
Chloride: 100 mmol/L (ref 98–111)
Creatinine, Ser: 1.26 mg/dL — ABNORMAL HIGH (ref 0.44–1.00)
GFR, Estimated: 41 mL/min — ABNORMAL LOW (ref >60)
Glucose, Bld: 125 mg/dL — ABNORMAL HIGH (ref 70–99)
Potassium: 4.4 mmol/L (ref 3.5–5.1)
Sodium: 136 mmol/L (ref 135–145)

## 2020-05-11 LAB — CBC
HCT: 36 % (ref 36.0–46.0)
Hemoglobin: 11.6 g/dL — ABNORMAL LOW (ref 12.0–15.0)
MCH: 29.3 pg (ref 26.0–34.0)
MCHC: 32.2 g/dL (ref 30.0–36.0)
MCV: 90.9 fL (ref 80.0–100.0)
Platelets: 224 10*3/uL (ref 150–400)
RBC: 3.96 MIL/uL (ref 3.87–5.11)
RDW: 15.2 % (ref 11.5–15.5)
WBC: 11.2 10*3/uL — ABNORMAL HIGH (ref 4.0–10.5)
nRBC: 0 % (ref 0.0–0.2)

## 2020-05-11 LAB — PHOSPHORUS: Phosphorus: 4.9 mg/dL — ABNORMAL HIGH (ref 2.5–4.6)

## 2020-05-11 LAB — MAGNESIUM: Magnesium: 1.9 mg/dL (ref 1.7–2.4)

## 2020-05-11 NOTE — Progress Notes (Addendum)
Progress Note    Deborah Martinez  TWS:[id number] DOB: 10-16-1927  DOA: 05/10/2020 PCP: [name], MD      Brief Narrative:    Medical records reviewed and are as summarized below:  Deborah Martinez is a 87 y.o. female       Assessment/Plan:   Active Problems:   Acute on chronic systolic (congestive) heart failure (HCC)   Body mass index is 25.09 kg/m.   Acute on chronic systolic CHF Elevated troponin likely from demand ischemia Abnormal urinalysis concerning for UTI Elevated liver enzymes Type II DM with hyperglycemia A. fib with RVR, converted to NSR CKD stage IIIb Generalized weakness Other comorbidities include anxiety, depression, hypothyroidism, dementia.   PLAN  Continue IV Lasix.  Monitor BMP, daily weight and urine output..  2D echo in March 2021 showed EF estimated at 25 to 30%, moderately elevated pulmonary artery systolic pressure, moderate to severe mitral regurgitation, moderate to severe tricuspid regurgitation.  Repeat 2D echo is pending.  Continue empiric IV Rocephin pending urine cultures.  Continue metoprolol and Eliquis for A. fib  NovoLog as needed for hyperglycemia  Continue antidepressants  PT and OT evaluation.    Diet Order            Diet Heart Room service appropriate? Yes; Fluid consistency: Thin  Diet effective now                    Consultants:  Cardiologist  Procedures:  None    Medications:   . apixaban  2.5 mg Oral BID  . buPROPion  100 mg Oral Daily  . calcium carbonate  500 mg of elemental calcium Oral BID WC  . donepezil  10 mg Oral QHS  . furosemide  20 mg Intravenous BID  . insulin aspart  0-5 Units Subcutaneous QHS  . insulin aspart  0-9 Units Subcutaneous TID WC  . levothyroxine  50 mcg Oral Daily  . metoprolol succinate  12.5 mg Oral Daily  . potassium chloride SA  20 mEq Oral Daily  . sertraline  100 mg Oral Daily   Continuous Infusions: . cefTRIAXone (ROCEPHIN)  IV 1 g (05/11/20  0033)     Anti-infectives (From admission, onward)   Start     Dose/Rate Route Frequency Ordered Stop   05/10/20 2315  cefTRIAXone (ROCEPHIN) 1 g in sodium chloride 0.9 % 100 mL IVPB        1 g 200 mL/hr over 30 Minutes Intravenous Daily-1800 05/10/20 2309               Family Communication/Anticipated D/C date and plan/Code Status   DVT prophylaxis: apixaban (ELIQUIS) tablet 2.5 mg Start: 05/11/20 0000 apixaban (ELIQUIS) tablet 2.5 mg     Code Status: DNR  Family Communication: None Disposition Plan:    Status is: Inpatient  Remains inpatient appropriate because:IV treatments appropriate due to intensity of illness or inability to take PO and Inpatient level of care appropriate due to severity of illness   Dispo:  Patient From: Skilled Nursing Facility  Planned Disposition: Skilled Nursing Facility  Medically stable for discharge: No             Subjective:   Interval events noted.  She says she feels okay.  Breathing is better.  No chest pain, abdominal pain, dysuria, vomiting or diarrhea.  Objective:    Vitals:   05/10/20 2015 05/11/20 0516 05/11/20 0811 05/11/20 1155  BP: (!) 149/106 (!) 142/99 (!) 150/88 124/77  Pulse: 66 68 72 61  Resp: 18 16 18 18   Temp: 98 F (36.7 C) 97.6 F (36.4 C) 97.8 F (36.6 C)   TempSrc: Oral Oral    SpO2: 100% 97%  100%  Weight:  68.4 kg    Height:       No data found.   Intake/Output Summary (Last 24 hours) at 05/11/2020 1159 Last data filed at 05/11/2020 0945 Gross per 24 hour  Intake 0 ml  Output 650 ml  Net -650 ml   Filed Weights   05/10/20 1127 05/11/20 0516  Weight: 59 kg 68.4 kg    Exam:  GEN: NAD SKIN: Warm and dry EYES: EOMI ENT: MMM CV: RRR, systolic murmur loudest in mitral area PULM: CTA B ABD: soft, ND, NT, +BS CNS: AAO x 3, non focal EXT: B/l leg edema (2+), no tenderness        Data Reviewed:   I have personally reviewed following labs and imaging  studies:  Labs: Labs show the following:   Basic Metabolic Panel: Recent Labs  Lab 05/10/20 1124 05/11/20 0551  NA 135 136  K 5.0 4.4  CL 100 100  CO2 24 22  GLUCOSE 179* 125*  BUN 39* 36*  CREATININE 1.17* 1.26*  CALCIUM 9.8 9.3  MG  --  1.9  PHOS  --  4.9*   GFR Estimated Creatinine Clearance: 28.9 mL/min (A) (by C-G formula based on SCr of 1.26 mg/dL (H)). Liver Function Tests: Recent Labs  Lab 05/10/20 1124  AST 67*  ALT 55*  ALKPHOS 125  BILITOT 0.9  PROT 6.0*  ALBUMIN 3.1*   No results for input(s): LIPASE, AMYLASE in the last 168 hours. No results for input(s): AMMONIA in the last 168 hours. Coagulation profile No results for input(s): INR, PROTIME in the last 168 hours.  CBC: Recent Labs  Lab 05/10/20 1124 05/11/20 0551  WBC 7.3 11.2*  NEUTROABS 6.1  --   HGB 11.4* 11.6*  HCT 35.5* 36.0  MCV 90.1 90.9  PLT 252 224   Cardiac Enzymes: No results for input(s): CKTOTAL, CKMB, CKMBINDEX, TROPONINI in the last 168 hours. BNP (last 3 results) No results for input(s): PROBNP in the last 8760 hours. CBG: Recent Labs  Lab 05/11/20 0515 05/11/20 0813 05/11/20 1156  GLUCAP 111* 122* 91   D-Dimer: No results for input(s): DDIMER in the last 72 hours. Hgb A1c: Recent Labs    05/11/20 0551  HGBA1C 5.8*   Lipid Profile: No results for input(s): CHOL, HDL, LDLCALC, TRIG, CHOLHDL, LDLDIRECT in the last 72 hours. Thyroid function studies: No results for input(s): TSH, T4TOTAL, T3FREE, THYROIDAB in the last 72 hours.  Invalid input(s): FREET3 Anemia work up: No results for input(s): VITAMINB12, FOLATE, FERRITIN, TIBC, IRON, RETICCTPCT in the last 72 hours. Sepsis Labs: Recent Labs  Lab 05/10/20 1124 05/10/20 1711 05/11/20 0551  WBC 7.3  --  11.2*  LATICACIDVEN 3.3* 2.0*  --     Microbiology Recent Results (from the past 240 hour(s))  Resp Panel by RT-PCR (Flu A&B, Covid) Nasopharyngeal Swab     Status: None   Collection Time: 05/10/20   2:31 PM   Specimen: Nasopharyngeal Swab; Nasopharyngeal(NP) swabs in vial transport medium  Result Value Ref Range Status   SARS Coronavirus 2 by RT PCR NEGATIVE NEGATIVE Final    Comment: (NOTE) SARS-CoV-2 target nucleic acids are NOT DETECTED.  The SARS-CoV-2 RNA is generally detectable in upper respiratory specimens during the acute phase of infection. The lowest  concentration of SARS-CoV-2 viral copies this assay can detect is 138 copies/mL. A negative result does not preclude SARS-Cov-2 infection and should not be used as the sole basis for treatment or other patient management decisions. A negative result may occur with  improper specimen collection/handling, submission of specimen other than nasopharyngeal swab, presence of viral mutation(s) within the areas targeted by this assay, and inadequate number of viral copies(<138 copies/mL). A negative result must be combined with clinical observations, patient history, and epidemiological information. The expected result is Negative.  Fact Sheet for Patients:  BloggerCourse.com  Fact Sheet for Healthcare Providers:  SeriousBroker.it  This test is no t yet approved or cleared by the [place] FDA and  has been authorized for detection and/or diagnosis of SARS-CoV-2 by FDA under an Emergency Use Authorization (EUA). This EUA will remain  in effect (meaning this test can be used) for the duration of the COVID-19 declaration under Section 564(b)(1) of the Act, 21 U.S.C.section 360bbb-3(b)(1), unless the authorization is terminated  or revoked sooner.       Influenza A by PCR NEGATIVE NEGATIVE Final   Influenza B by PCR NEGATIVE NEGATIVE Final    Comment: (NOTE) The Xpert Xpress SARS-CoV-2/FLU/RSV plus assay is intended as an aid in the diagnosis of influenza from Nasopharyngeal swab specimens and should not be used as a sole basis for treatment. Nasal washings and aspirates  are unacceptable for Xpert Xpress SARS-CoV-2/FLU/RSV testing.  Fact Sheet for Patients: BloggerCourse.com  Fact Sheet for Healthcare Providers: SeriousBroker.it  This test is not yet approved or cleared by the [place] FDA and has been authorized for detection and/or diagnosis of SARS-CoV-2 by FDA under an Emergency Use Authorization (EUA). This EUA will remain in effect (meaning this test can be used) for the duration of the COVID-19 declaration under Section 564(b)(1) of the Act, 21 U.S.C. section 360bbb-3(b)(1), unless the authorization is terminated or revoked.  Performed at [facility], [address], [place], [place] [address]     Procedures and diagnostic studies:  DG Chest Portable 1 View  Result Date: 05/10/2020 CLINICAL DATA:  Burning on urination. EXAM: PORTABLE CHEST 1 VIEW COMPARISON:  Single-view of the chest 05/26/2019. FINDINGS: There is cardiomegaly without edema. No consolidative process, pneumothorax or effusion. Aortic atherosclerosis. No acute or focal bony abnormality. IMPRESSION: No acute disease. Cardiomegaly. Aortic Atherosclerosis (ICD10-I70.0). Electronically Signed   By: Drusilla Kanner M.D.   On: 05/10/2020 12:25               LOS: 1 day   Khaleesi Gruel  [facility]   Pager on [email or web address]. If 7PM-7AM, please contact night-coverage at [email or web address]     05/11/2020, 11:59 AM

## 2020-05-11 NOTE — Evaluation (Signed)
Occupational Therapy Evaluation Patient Details Name: Deborah Martinez MRN: [id number] DOB: Dec 21, 1927 Today's Date: 05/11/2020    History of Present Illness Pt is a 87 y.o. female presenting to hospital 4/28 with report of shallow breathing in setting of having her Lasix being held; also concern of possible UTI.  Recent hospital admission 4/13 d/t generalized weakness and poor oral intake.  Pt now admitted with acute on chronic systolic CHF, elevated troponin (suspect demand ischemia), and presumptive UTI.  PMH includes anxiety, a-fib, CHF, dementia, htn, lymphedema, UTI, thyroid disease, dizziness, ataxia, and L IMN 11/20/2018.   Clinical Impression   Pt seen for OT evaluation this date in setting of acute hospitalization d/t likely UTI and CHF exacerbation. Pt reports being INDEP at baseline, but is questionable historian 2/2 confusion this date. Pt from [facility] per chart review. This date, pt requires MIN A to come to sitting and MOD A for sit to sup. She declines to get OOB with OT on assessment. Currently, pt requires SETUP for seated UB ADLs, MIN/MOD A for seated LB ADLs as well as MIN cues for initiation/sequence throughout. Pt left in bed with meal tray setup and bed alarm set as she declines to get OOB to chair for lunch. Pt with bed alarm set and all other needs met and in reach. Will continue to follow acutely. At this time, as pt has declined with fxl mobility including bed mobility and INDEP with self care ADLs, anticipate she will require f/u OT services in STR setting to improve safety and strength.     Follow Up Recommendations  SNF (pending level of assist available at ALF.)    Equipment Recommendations  Other (comment) (defer to next level of care)    Recommendations for Other Services       Precautions / Restrictions Precautions Precautions: Fall Precaution Comments: Aspiration Restrictions Weight Bearing Restrictions: No      Mobility Bed Mobility Overal bed  mobility: Needs Assistance Bed Mobility: Supine to Sit;Sit to Supine     Supine to sit: Min assist;HOB elevated Sit to supine: Mod assist   General bed mobility comments: increasd assist for back to bed    Transfers Overall transfer level: Needs assistance Equipment used: Rolling walker (2 wheeled) Transfers: Sit to/from [facility] Sit to Stand: Min assist;+2 safety/equipment Stand pivot transfers: Min assist;+2 safety/equipment       General transfer comment: pt declines to get OOB with OT.    Balance Overall balance assessment: Needs assistance Sitting-balance support: No upper extremity supported;Feet supported Sitting balance-Leahy Scale: Good Sitting balance - Comments: G static, F dynamic with UE support required.   Standing balance support: Single extremity supported Standing balance-Leahy Scale: Fair Standing balance comment: deferred, F static standing per PT                           ADL either performed or assessed with clinical judgement   ADL Overall ADL's : Needs assistance/impaired                                       General ADL Comments: requires SETUP for seated UB ADLs, MIN/MOD A for seated LB ADLs, MIN A to come to sitting, declines to get OOB with OT. MIN cues for initiation/sequence     Vision Patient Visual Report: No change from baseline  Perception     Praxis      Pertinent Vitals/Pain Pain Assessment: No/denies pain     Hand Dominance     Extremity/Trunk Assessment Upper Extremity Assessment Upper Extremity Assessment: Generalized weakness   Lower Extremity Assessment Lower Extremity Assessment: Generalized weakness   Cervical / Trunk Assessment Cervical / Trunk Assessment: Normal   Communication Communication Communication: No difficulties   Cognition Arousal/Alertness: Awake/alert Behavior During Therapy: WFL for tasks assessed/performed Overall Cognitive Status: No  family/caregiver present to determine baseline cognitive functioning                                 General Comments: A&O to self only, unable to correctly state location, situation or any temporal concepts. Pt does eventually guess "[place]" with cues and "hospital" out of 3 options. Pt able to follow all simple one step commands, but does demo some increased processing time and poor insight into deficits.   General Comments       Exercises     Shoulder Instructions      Home Living Family/patient expects to be discharged to:: Assisted living                             Home Equipment:  (per chart review pt has RW)   Additional Comments: Per chart review pt is from ALF memory care (pt unable to verbalize any additional details).      Prior Functioning/Environment Level of Independence: Needs assistance        Comments: Pt reports not using any DME for ambulation but recent hospitalization pt reporting using RW for functional mobility.  Pt unable to recall assist required for ADL's and mobility.        OT Problem List: Impaired balance (sitting and/or standing);Decreased activity tolerance;Decreased cognition;Decreased safety awareness      OT Treatment/Interventions: Self-care/ADL training;Therapeutic exercise;DME and/or AE instruction;Therapeutic activities;Balance training;Patient/family education    OT Goals(Current goals can be found in the care plan section) Acute Rehab OT Goals Patient Stated Goal: to get stronger OT Goal Formulation: With patient Time For Goal Achievement: 05/25/20 Potential to Achieve Goals: Good ADL Goals Pt Will Perform Lower Body Bathing: with min guard assist;with min assist;sit to/from stand (with LRAD/AE PRN) Pt Will Perform Lower Body Dressing: with min guard assist;with min assist;sit to/from stand (with LRAD/AE PRN) Pt Will Transfer to Toilet: with min guard assist;ambulating;bedside commode (to/from restroom  with use of grab bars/LRAD PRN) Pt Will Perform Toileting - Clothing Manipulation and hygiene: with min guard assist;with supervision;sit to/from stand Pt/caregiver will Perform Home Exercise Program: Increased strength;Both right and left upper extremity;With minimal assist  OT Frequency: Min 1X/week   Barriers to D/C:            Co-evaluation              AM-PAC OT "6 Clicks" Daily Activity     Outcome Measure Help from another person eating meals?: None Help from another person taking care of personal grooming?: A Little Help from another person toileting, which includes using toliet, bedpan, or urinal?: A Lot Help from another person bathing (including washing, rinsing, drying)?: A Lot Help from another person to put on and taking off regular upper body clothing?: A Little Help from another person to put on and taking off regular lower body clothing?: A Little 6 Click Score: 17   End of  Session Nurse Communication: Mobility status  Activity Tolerance: Patient tolerated treatment well Patient left: in bed;with call bell/phone within reach;with bed alarm set  OT Visit Diagnosis: Unsteadiness on feet (R26.81);Muscle weakness (generalized) (M62.81)                Time: 3536-1443 OT Time Calculation (min): 28 min Charges:  OT General Charges $OT Visit: 1 Visit OT Evaluation $OT Eval Moderate Complexity: 1 Mod OT Treatments $Self Care/Home Management : 8-22 mins  Gerrianne Scale, MS, OTR/L ascom [phone] 05/11/20, 2:00 PM

## 2020-05-11 NOTE — Plan of Care (Signed)

## 2020-05-11 NOTE — Consult Note (Signed)
CARDIOLOGY CONSULT NOTE               Patient ID: Deborah Martinez MRN: [id number] DOB/AGE: 1928/01/06 87 y.o.  Admit date: 05/10/2020 Referring Physician Margo Aye Primary Physician [name] Primary Cardiologist Gwen Pounds Reason for Consultation acute on chronic systolic CHF  HPI: 87 year old female referred for evaluation of acute on chronic systolic CHF. The patient has a history of dementia, atrial fibrillation, on Eliquis, hyperlipidemia, hypertension, type II diabetes, and anemia. The patient was admitted 04/25/2020 for generalized weakness and poor oral intake. Her diuretics were held and not resumed. She was brought by EMS yesterday from the assisted living facility for evaluation of shortness of breath and dysuria. Labs in the ER are notable for UTI, BNP greater than 4500, high sensitivity troponin 44 and 54. Chest xray showed no active disease with cardiomegaly. 2D echocardiogram in 03/2019 showed severely reduced left ventricular function with LVEF 25-30%, moderate to severe mitral and tricuspid regurgitation. ECG revealed sinus rhythm at a rate of 70 bpm with nonspecific ST-T wave abnormalities, similar to previous ECG. The patient was started on IV Lasix 20 mg BID. The patient denies chest pain. She states that she is breathing normally.   Review of systems incomplete due to patient's baseline dementia.    Past Medical History:  Diagnosis Date  . Anxiety   . Arrhythmia    atrial fibrillation  . CHF (congestive heart failure) (HCC)   . Constipation   . Cystocele   . Dementia (HCC)   . Depression   . Hemorrhoid   . Hypercholesterolemia   . Hypertension   . Hypothyroidism   . Incomplete bladder emptying   . Lymphedema   . Postmenopausal atrophic vaginitis   . Rectocele   . Thyroid disease   . UTI (lower urinary tract infection)   . Vaginal enterocele     Past Surgical History:  Procedure Laterality Date  . APPENDECTOMY    . CATARACT EXTRACTION W/PHACO Left  12/13/2019   Procedure: CATARACT EXTRACTION PHACO AND INTRAOCULAR LENS PLACEMENT (IOC) LEFT 7.99 00:45.3;  Surgeon: Galen Manila, MD;  Location: [facility] ORS;  Service: Ophthalmology;  Laterality: Left;  . EYE SURGERY  2012   remve cataract  . INTRAMEDULLARY (IM) NAIL INTERTROCHANTERIC Left 11/20/2018   Procedure: INTRAMEDULLARY (IM) NAIL INTERTROCHANTRIC;  Surgeon: Christena Flake, MD;  Location: [facility] ORS;  Service: Orthopedics;  Laterality: Left;  . TOTAL ABDOMINAL HYSTERECTOMY  1977    Medications Prior to Admission  Medication Sig Dispense Refill Last Dose  . acetaminophen (TYLENOL) 325 MG tablet Take 2 tablets (650 mg total) by mouth every 6 (six) hours as needed for mild pain or fever. (Patient taking differently: Take 650 mg by mouth every 6 (six) hours as needed for mild pain or fever. Do not exceed 3000mg  in 24 hours)   prn at prn  . alendronate (FOSAMAX) 70 MG tablet Take 70 mg by mouth once a week. Take with a full glass of water on an empty stomach.   05/09/2020 at Unknown time  . apixaban (ELIQUIS) 2.5 MG TABS tablet Take 1 tablet (2.5 mg total) by mouth 2 (two) times daily. 180 tablet 0 05/10/2020 at 0800  . buPROPion (WELLBUTRIN SR) 100 MG 12 hr tablet Take 100 mg by mouth daily.   05/10/2020 at Unknown time  . calcium carbonate (OSCAL) 1500 (600 Ca) MG TABS tablet Take 600 mg by mouth in the morning and at bedtime.   05/10/2020 at Unknown time  . docusate sodium (  COLACE) 100 MG capsule Take 1 capsule (100 mg total) by mouth 2 (two) times daily. 10 capsule 0 05/10/2020 at Unknown time  . donepezil (ARICEPT) 10 MG tablet Take 10 mg by mouth at bedtime.    05/09/2020 at Unknown time  . ergocalciferol (VITAMIN D2) 1.25 MG (50000 UT) capsule Take 5,000 Units by mouth every 30 (thirty) days.   05/10/2020 at Unknown time  . levothyroxine (SYNTHROID, LEVOTHROID) 50 MCG tablet Take 50 mcg by mouth daily.    05/10/2020 at 0600  . meclizine (ANTIVERT) 12.5 MG tablet Take 1 tablet (12.5 mg total) by  mouth 3 (three) times daily as needed for dizziness. 30 tablet 0 prn at prn  . metoprolol succinate (TOPROL-XL) 50 MG 24 hr tablet Take 1 tablet (50 mg total) by mouth daily. 30 tablet 0 05/10/2020 at 0800  . Omega-3 Fatty Acids (FISH OIL) 1000 MG CAPS Take 1,000 mg by mouth daily.    05/10/2020 at 0800  . sertraline (ZOLOFT) 100 MG tablet Take 100 mg by mouth daily.   05/09/2020 at 2000  . Amino Acids-Protein Hydrolys (FEEDING SUPPLEMENT, PRO-STAT SUGAR FREE 64,) LIQD Take 30 mLs by mouth daily.     . calcium carbonate (TUMS - DOSED IN MG ELEMENTAL CALCIUM) 500 MG chewable tablet Chew 2 tablets (400 mg of elemental calcium total) by mouth 2 (two) times daily as needed for indigestion or heartburn. (Patient not taking: Reported on 05/10/2020)   Not Taking at Unknown time  . feeding supplement, ENSURE ENLIVE, (ENSURE ENLIVE) LIQD Take 237 mLs by mouth 2 (two) times daily between meals. (Patient taking differently: Take 237 mLs by mouth 2 (two) times daily between meals. STRAWBERRY ONLY) 237 mL 12   . furosemide (LASIX) 40 MG tablet Hold until outpatient followup due to acute kidney injury and poor oral hydration. (Patient not taking: Reported on 05/10/2020) 30 tablet 0 Not Taking at Unknown time  . losartan (COZAAR) 100 MG tablet Hold until outpatient followup due to acute kidney injury. (Patient not taking: Reported on 05/10/2020)   Not Taking at Unknown time  . potassium chloride SA (KLOR-CON) 20 MEQ tablet Hold while not taking Lasix. (Patient not taking: Reported on 05/10/2020)   Not Taking at Unknown time  . senna-docusate (SENOKOT-S) 8.6-50 MG tablet Take 1 tablet by mouth at bedtime as needed for moderate constipation. (Patient not taking: Reported on 05/10/2020)   Not Taking at Unknown time   Social History   Socioeconomic History  . Marital status: Married    Spouse name: Not on file  . Number of children: Not on file  . Years of education: Not on file  . Highest education level: Not on file   Occupational History  . Not on file  Tobacco Use  . Smoking status: Former Smoker    Types: Cigarettes    Quit date: 1970    Years since quitting: 52.3  . Smokeless tobacco: Never Used  Vaping Use  . Vaping Use: Never used  Substance and Sexual Activity  . Alcohol use: No  . Drug use: No  . Sexual activity: Not Currently  Other Topics Concern  . Not on file  Social History Narrative  . Not on file   Social Determinants of Health   Financial Resource Strain: Not on file  Food Insecurity: Not on file  Transportation Needs: Not on file  Physical Activity: Not on file  Stress: Not on file  Social Connections: Not on file  Intimate Partner Violence: Not on  file    Family History  Problem Relation Age of Onset  . Heart attack Father   . Diabetes Brother   . Cancer Neg Hx       Review of systems complete and found to be negative unless listed above      PHYSICAL EXAM  General: Well developed, well nourished, in no acute distress, lying in bed, smiling, pleasant HEENT:  Normocephalic and atramatic Neck:  No JVD.  Lungs: Clear bilaterally to auscultation, slight increased effort of breathing with conversation Heart: HRRR . 2/6 systolic murmurs.  Abdomen:  abdomen soft and non-tender  Msk:  Able to sit upright in bed with some assistance. Normal strength and tone for age. Extremities: No clubbing, cyanosis with 1+ bilateral lower extremity edema.   Neuro: Alert and oriented X 2 Psych:  Good affect, responds appropriately  Labs:   Lab Results  Component Value Date   WBC 11.2 (H) 05/11/2020   HGB 11.6 (L) 05/11/2020   HCT 36.0 05/11/2020   MCV 90.9 05/11/2020   PLT 224 05/11/2020    Recent Labs  Lab 05/10/20 1124 05/11/20 0551  NA 135 136  K 5.0 4.4  CL 100 100  CO2 24 22  BUN 39* 36*  CREATININE 1.17* 1.26*  CALCIUM 9.8 9.3  PROT 6.0*  --   BILITOT 0.9  --   ALKPHOS 125  --   ALT 55*  --   AST 67*  --   GLUCOSE 179* 125*   Lab Results   Component Value Date   TROPONINI <0.03 09/02/2017    Lab Results  Component Value Date   CHOL 186 01/31/2017   Lab Results  Component Value Date   HDL 76 01/31/2017   Lab Results  Component Value Date   LDLCALC 88 01/31/2017   Lab Results  Component Value Date   TRIG 108 01/31/2017   Lab Results  Component Value Date   CHOLHDL 2.4 01/31/2017   No results found for: LDLDIRECT    Radiology: DG Chest Portable 1 View  Result Date: 05/10/2020 CLINICAL DATA:  Burning on urination. EXAM: PORTABLE CHEST 1 VIEW COMPARISON:  Single-view of the chest 05/26/2019. FINDINGS: There is cardiomegaly without edema. No consolidative process, pneumothorax or effusion. Aortic atherosclerosis. No acute or focal bony abnormality. IMPRESSION: No acute disease. Cardiomegaly. Aortic Atherosclerosis (ICD10-I70.0). Electronically Signed   By: Drusilla Kanner M.D.   On: 05/10/2020 12:25    Telemetry: sinus rhythm, 73 bpm  ASSESSMENT AND PLAN:  1. Acute on chronic systolic CHF, secondary to being off of diuretics due to recent admission for poor oral intake. The patient was restarted on IV Lasix 20 mg BID. BNP greater than 4500. 2D echocardiogram in 2021 showed LVEF 25-30% with moderate to severe MR and TR.  2. Atrial fibrillation, currently in sinus rhythm, on metoprolol for rate control and Eliquis for stroke prevention. 3. Hypertension 4. Borderline elevated high sensitivity troponin, likely secondary to demand supply ischemia in setting of acute on chronic systolic CHF. ECG with nonspecific ST-T wave abnormalities; similar to previous ECG.  Recommendations: 1. Agree with current therapy 2. Continue IV Lasix 20 mg BID with careful monitoring of renal function 3. 2D echocardiogram 4. Continue low dose Eliquis for stroke prevention 5. Continue metoprolol succinate for rate control 6. Plan to resume home losartan for HF and HTN when renal function stabilizes 7. Further recommendations pending  patient's initial course.  Signed: Leanora Ivanoff PA-C 05/11/2020, 8:28 AM

## 2020-05-11 NOTE — Evaluation (Signed)
Physical Therapy Evaluation Patient Details Name: Deborah Martinez MRN: [id number] DOB: 10-13-1927 Today's Date: 05/11/2020   History of Present Illness  Pt is a 87 y.o. female presenting to hospital 4/28 with report of shallow breathing in setting of having her Lasix being held; also concern of possible UTI.  Recent hospital admission 4/13 d/t generalized weakness and poor oral intake.  Pt now admitted with acute on chronic systolic CHF, elevated troponin (suspect demand ischemia), and presumptive UTI.  PMH includes anxiety, a-fib, CHF, dementia, htn, lymphedema, UTI, thyroid disease, dizziness, ataxia, and L IMN 11/20/2018.  Clinical Impression  Prior to hospital admission, pt reports being ambulatory; per chart pt lives at an ALF (memory care).  Upon PT arrival to pt's room, pt's peripheral IV noted to be removed and laying in bed next to pt (nurse notified and came to address).  Currently pt is min assist with transfers and CGA to min assist to ambulate 20 feet with RW.  Pt requesting to toilet beginning of session (no BSC in room; pt reports able to walk to bathroom).  During walk to bathroom, pt incontinent of loose bowel.  Towards end of toileting pt reporting feeling dizzy sitting on toilet (BP 136/87) so therapist called for additional help (pt's nurse notified and came to assist).  With 2nd assist, pt transferred toilet to recliner and then recliner to bed with RW; then pt assisted back to bed (pt reporting feeling better laying in bed).  HR 62-71 bpm and O2 sats 95% or greater on room air during sessions activities.  Pt would benefit from skilled PT to address noted impairments and functional limitations (see below for any additional details).  Upon hospital discharge, anticipate pt will require some assist with functional mobility initially; if ALF can not provide assist required for safe discharge to facility, pt would benefit from SNF to improve overall strength and functional mobility.    Follow  Up Recommendations SNF (pending level of assist available at ALF)    Equipment Recommendations  Rolling walker with 5" wheels    Recommendations for Other Services OT consult     Precautions / Restrictions Precautions Precautions: Fall Precaution Comments: Aspiration Restrictions Weight Bearing Restrictions: No      Mobility  Bed Mobility Overal bed mobility: Needs Assistance Bed Mobility: Supine to Sit;Sit to Supine     Supine to sit: Min assist;HOB elevated (assist for trunk) Sit to supine: +2 for physical assistance (2 assist for trunk and B LE's d/t pt not feeling great)   General bed mobility comments: vc's for use of bedrail semi-supine to sitting edge of bed    Transfers Overall transfer level: Needs assistance Equipment used: Rolling walker (2 wheeled) Transfers: Sit to/from [employer] Sit to Stand: Min assist;+2 safety/equipment Stand pivot transfers: Min assist;+2 safety/equipment       General transfer comment: min assist to stand from bed x1 trial; min assist (and 2nd assist for safety) to stand from toilet x1 trial and from recliner x1 trial; stand step turn toilet to recliner and then recliner to bed with RW min assist x1 (plus 2nd assist for safety)  Ambulation/Gait Ambulation/Gait assistance: Min guard;Min assist Gait Distance (Feet): 20 Feet (to bathroom) Assistive device: Rolling walker (2 wheeled)   Gait velocity: decreased   General Gait Details: partial step through gait pattern  Stairs            Wheelchair Mobility    Modified Rankin (Stroke Patients Only)  Balance Overall balance assessment: Needs assistance Sitting-balance support: No upper extremity supported;Feet supported Sitting balance-Leahy Scale: Good Sitting balance - Comments: steady sitting reaching within BOS   Standing balance support: Single extremity supported Standing balance-Leahy Scale: Fair Standing balance comment: pt requiring at  least single UE support for static standing balance                             Pertinent Vitals/Pain Pain Assessment: No/denies pain    Home Living Family/patient expects to be discharged to:: Assisted living               Home Equipment:  (per chart review pt has RW) Additional Comments: Per chart review pt is from ALF memory care (pt unable to verbalize any additional details).    Prior Function Level of Independence: Needs assistance         Comments: Pt reports not using any DME for ambulation but recent hospitalization pt reporting using RW for functional mobility.  Pt unable to recall assist required for ADL's and mobility.     Hand Dominance        Extremity/Trunk Assessment   Upper Extremity Assessment Upper Extremity Assessment: Generalized weakness    Lower Extremity Assessment Lower Extremity Assessment: Generalized weakness    Cervical / Trunk Assessment Cervical / Trunk Assessment: Normal  Communication   Communication: No difficulties  Cognition Arousal/Alertness:  (Sleeping upon PT arrival but woken with vc's) Behavior During Therapy: WFL for tasks assessed/performed Overall Cognitive Status: No family/caregiver present to determine baseline cognitive functioning                                 General Comments: A&O to self and that she is in [employer] Comments   Nursing cleared pt for participation in physical therapy.  Pt agreeable to PT session.    Exercises     Assessment/Plan    PT Assessment Patient needs continued PT services  PT Problem List Decreased strength;Decreased activity tolerance;Decreased balance;Decreased mobility;Decreased knowledge of use of DME       PT Treatment Interventions DME instruction;Gait training;Therapeutic exercise;Balance training;Functional mobility training;Therapeutic activities;Patient/family education    PT Goals (Current goals can be found in the Care  Plan section)  Acute Rehab PT Goals Patient Stated Goal: to improve strength and mobility PT Goal Formulation: With patient Time For Goal Achievement: 05/25/20 Potential to Achieve Goals: Good    Frequency Min 2X/week   Barriers to discharge   Question level of assist available    Co-evaluation               AM-PAC PT "6 Clicks" Mobility  Outcome Measure Help needed turning from your back to your side while in a flat bed without using bedrails?: A Little Help needed moving from lying on your back to sitting on the side of a flat bed without using bedrails?: A Little Help needed moving to and from a bed to a chair (including a wheelchair)?: A Little Help needed standing up from a chair using your arms (e.g., wheelchair or bedside chair)?: A Little Help needed to walk in hospital room?: A Little Help needed climbing 3-5 steps with a railing? : A Lot 6 Click Score: 17    End of Session Equipment Utilized During Treatment: Gait belt Activity Tolerance: Other (comment) (Limited d/t dizziness) Patient left: in  bed;with call bell/phone within reach;with bed alarm set;Other (comment) (B heels floating via pillow support; purewick in place) Nurse Communication: Mobility status;Precautions;Other (comment) (pt's symptoms and BP) PT Visit Diagnosis: Unsteadiness on feet (R26.81);Muscle weakness (generalized) (M62.81);Other abnormalities of gait and mobility (R26.89)    Time: 0017-4944 PT Time Calculation (min) (ACUTE ONLY): 53 min   Charges:   PT Evaluation $PT Eval Low Complexity: 1 Low PT Treatments $Therapeutic Activity: 23-37 mins       Hendricks Limes, PT 05/11/20, 10:54 AM

## 2020-05-11 NOTE — Progress Notes (Signed)
*  PRELIMINARY RESULTS* Echocardiogram 2D Echocardiogram has been performed.  Deborah Martinez 05/11/2020, 2:46 PM

## 2020-05-11 NOTE — Progress Notes (Addendum)
    Heart Failure Nurse Navigator Note  HFrEF < 20%    Spoke with patient's son today.  She is currently sleeping and he states that he really did not feel she would able to add to the conversation.  Discussed  her cares at the care facility, he states at 1 point they were weighing her daily and now they have gone to weighing her once a week.  He feels that the change in her condition was due to Lasix being discontinued.    He states at the home she has to be reminded to drink her Ensure, otherwise she will drink about a third of it and then forget that it is there.  Discussed following up in the heart failure clinic.  He had  an appointment for May 3 felt that that was too soon with her being in the hospital also asked that it be made later in the afternoon.  Given an appointment for May 17 at 1 PM.   Tresa Endo RN [facility]

## 2020-05-12 DIAGNOSIS — I5023 Acute on chronic systolic (congestive) heart failure: Secondary | ICD-10-CM | POA: Diagnosis not present

## 2020-05-12 LAB — URINE CULTURE: Culture: NO GROWTH

## 2020-05-12 LAB — BASIC METABOLIC PANEL
Anion gap: 11 (ref 5–15)
BUN: 31 mg/dL — ABNORMAL HIGH (ref 8–23)
CO2: 26 mmol/L (ref 22–32)
Calcium: 8.7 mg/dL — ABNORMAL LOW (ref 8.9–10.3)
Chloride: 101 mmol/L (ref 98–111)
Creatinine, Ser: 1.13 mg/dL — ABNORMAL HIGH (ref 0.44–1.00)
GFR, Estimated: 46 mL/min — ABNORMAL LOW (ref >60)
Glucose, Bld: 113 mg/dL — ABNORMAL HIGH (ref 70–99)
Potassium: 3.4 mmol/L — ABNORMAL LOW (ref 3.5–5.1)
Sodium: 138 mmol/L (ref 135–145)

## 2020-05-12 LAB — CBC WITH DIFFERENTIAL/PLATELET
Abs Immature Granulocytes: 0.07 10*3/uL (ref 0.00–0.07)
Basophils Absolute: 0 10*3/uL (ref 0.0–0.1)
Basophils Relative: 0 %
Eosinophils Absolute: 0.1 10*3/uL (ref 0.0–0.5)
Eosinophils Relative: 1 %
HCT: 36.5 % (ref 36.0–46.0)
Hemoglobin: 11.6 g/dL — ABNORMAL LOW (ref 12.0–15.0)
Immature Granulocytes: 1 %
Lymphocytes Relative: 5 %
Lymphs Abs: 0.5 10*3/uL — ABNORMAL LOW (ref 0.7–4.0)
MCH: 28.9 pg (ref 26.0–34.0)
MCHC: 31.8 g/dL (ref 30.0–36.0)
MCV: 91 fL (ref 80.0–100.0)
Monocytes Absolute: 0.9 10*3/uL (ref 0.1–1.0)
Monocytes Relative: 10 %
Neutro Abs: 7.7 10*3/uL (ref 1.7–7.7)
Neutrophils Relative %: 83 %
Platelets: 216 10*3/uL (ref 150–400)
RBC: 4.01 MIL/uL (ref 3.87–5.11)
RDW: 15.4 % (ref 11.5–15.5)
WBC: 9.2 10*3/uL (ref 4.0–10.5)
nRBC: 0 % (ref 0.0–0.2)

## 2020-05-12 LAB — GLUCOSE, CAPILLARY
Glucose-Capillary: 133 mg/dL — ABNORMAL HIGH (ref 70–99)
Glucose-Capillary: 132 mg/dL — ABNORMAL HIGH (ref 70–99)
Glucose-Capillary: 122 mg/dL — ABNORMAL HIGH (ref 70–99)
Glucose-Capillary: 138 mg/dL — ABNORMAL HIGH (ref 70–99)

## 2020-05-12 LAB — MAGNESIUM: Magnesium: 1.9 mg/dL (ref 1.7–2.4)

## 2020-05-12 MED ORDER — POTASSIUM CHLORIDE CRYS ER 20 MEQ PO TBCR
40.0000 meq | EXTENDED_RELEASE_TABLET | Freq: Once | ORAL | Status: AC
  Administered 2020-05-12: 40 meq via ORAL
  Filled 2020-05-12: qty 2

## 2020-05-12 MED ORDER — FUROSEMIDE 20 MG PO TABS
20.0000 mg | ORAL_TABLET | Freq: Two times a day (BID) | ORAL | Status: DC
  Administered 2020-05-12 – 2020-05-14 (×5): 20 mg via ORAL
  Filled 2020-05-12 (×5): qty 1

## 2020-05-12 NOTE — NC FL2 (Signed)
[place] MEDICAID FL2 LEVEL OF CARE SCREENING TOOL     IDENTIFICATION  Patient Name: Deborah Martinez Birthdate: 1927/08/17 Sex: female Admission Date (Current Location): 05/10/2020  [place] and [place] Number:  Chiropodist and Address:  [facility], [address] [place], [place] [address]      Provider Number: [id number]  Attending Physician Name and Address:  Lurene Shadow, MD  Relative Name and Phone Number:  [name] (son) [phone]    Current Level of Care: Hospital Recommended Level of Care: Assisted Living Facility Prior Approval Number:    Date Approved/Denied:   PASRR Number: [id number]  Discharge Plan:      Current Diagnoses: Patient Active Problem List   Diagnosis Date Noted  . Acute on chronic systolic (congestive) heart failure (HCC) 05/10/2020  . Acute decompensated heart failure (HCC) 03/23/2019  . Hypothyroid 11/20/2018  . Protein calorie malnutrition (HCC) 11/20/2018  . AF (paroxysmal atrial fibrillation) (HCC) 11/20/2018  . Femur fracture, left (HCC) 11/19/2018  . UTI (urinary tract infection) 02/01/2017  . Chest pain 01/10/2017  . Dizziness 01/10/2017  . Ataxia 01/10/2017  . Cystocele, midline 07/27/2014  . Menopause 07/27/2014  . Vaginal atrophy 07/27/2014  . Incomplete bladder emptying 07/27/2014  . Anxiety and depression 07/27/2014  . Constipation 07/27/2014  . Hypertension 07/27/2014  . Hypercholesterolemia 07/27/2014  . Hemorrhoids 07/27/2014  . Rectocele 07/27/2014  . Vaginal enterocele 07/27/2014    Orientation RESPIRATION BLADDER Height & Weight     Self  Normal Incontinent Weight: 154 lb 6.4 oz (70 kg) Height:  5\' 5"  (165.1 cm)  BEHAVIORAL SYMPTOMS/MOOD NEUROLOGICAL BOWEL NUTRITION STATUS      Incontinent Diet (heart diet; thin liquids)  AMBULATORY STATUS COMMUNICATION OF NEEDS Skin   Supervision Verbally Bruising                       Personal Care Assistance Level of  Assistance  Bathing,Feeding,Dressing Bathing Assistance: Limited assistance Feeding assistance: Limited assistance Dressing Assistance: Limited assistance     Functional Limitations Info             SPECIAL CARE FACTORS FREQUENCY  PT (By licensed PT),OT (By licensed OT)                    Contractures      Additional Factors Info  Code Status,Allergies Code Status Info: DNR Allergies Info: nka           Current Medications (05/12/2020):  This is the current hospital active medication list Current Facility-Administered Medications  Medication Dose Route Frequency Provider Last Rate Last Admin  . acetaminophen (TYLENOL) tablet 650 mg  650 mg Oral Q6H PRN 05/14/2020, DO      . apixaban (ELIQUIS) tablet 2.5 mg  2.5 mg Oral BID Darlin Drop N, DO   2.5 mg at 05/12/20 0956  . buPROPion (WELLBUTRIN SR) 12 hr tablet 100 mg  100 mg Oral Daily 05/14/20 N, DO   100 mg at 05/12/20 05/14/20  . calcium carbonate (OS-CAL - dosed in mg of elemental calcium) tablet 500 mg of elemental calcium  500 mg of elemental calcium Oral BID WC Hall, Carole N, DO   500 mg of elemental calcium at 05/12/20 0956  . donepezil (ARICEPT) tablet 10 mg  10 mg Oral QHS Hall, 05/14/20 N, DO   10 mg at 05/11/20 2123  . furosemide (LASIX) tablet 20 mg  20 mg Oral BID Paraschos, Alexander,  MD   20 mg at 05/12/20 0958  . insulin aspart (novoLOG) injection 0-5 Units  0-5 Units Subcutaneous QHS Hall, Carole N, DO      . insulin aspart (novoLOG) injection 0-9 Units  0-9 Units Subcutaneous TID WC Dow Adolph N, DO   1 Units at 05/12/20 1203  . levothyroxine (SYNTHROID) tablet 50 mcg  50 mcg Oral Daily Dow Adolph N, DO   50 mcg at 05/12/20 1761  . metoprolol succinate (TOPROL-XL) 24 hr tablet 12.5 mg  12.5 mg Oral Daily Dow Adolph N, DO   12.5 mg at 05/12/20 0957  . ondansetron (ZOFRAN) injection 4 mg  4 mg Intravenous Q6H PRN Dow Adolph N, DO      . potassium chloride SA (KLOR-CON) CR tablet 20 mEq  20 mEq  Oral Daily Dow Adolph N, DO   20 mEq at 05/12/20 0957  . sertraline (ZOLOFT) tablet 100 mg  100 mg Oral Daily Dow Adolph N, DO   100 mg at 05/12/20 6073     Discharge Medications: Please see discharge summary for a list of discharge medications.  Relevant Imaging Results:  Relevant Lab Results:   Additional Information SS #: [id number]  Fahd Galea E Tyree Vandruff, LCSW

## 2020-05-12 NOTE — TOC Initial Note (Addendum)
Transition of Care [facility]) - Initial/Assessment Note    Patient Details  Name: Deborah Martinez MRN: [id number] Date of Birth: [birth year]  [facility] CM/SW Contact:    Liliana Cline, LCSW Phone Number: 05/12/2020, 3:02 PM  Clinical Narrative:                Patient is disoriented. CSW spoke with patient's son/HCPOA Iantha Fallen via phone. Patient lives at [facility] (since Dec 2020). Patient uses a RW and has used [facility] for HHPT at the ALF in the past. Patient has had her COVID vaccines, son is unsure about the booster. Patient's heart failure is monitored at ALF. CSW explained PT recommendation for SNF. Iantha Fallen reported he would prefer patient return to ALF with HHPT/OT if possible because he feels patient would do better in her home environment she is used to. CSW called and spoke with [place] at [facility]. She reported she will have to check with her Supervisors Bonita Quin or Clydie Braun). Melissa agreed to ask her Supervisors to call CSW as soon as possible about discharge planning. She stated they will likely want to come assess the patient at the hospital.  3:25- Return call from [employer]. Provided updates from PT note. She reported they would need to assess patient in person and the earliest this can be done is Monday. She stated either Ed or Linda from [employer] will touch base with TOC on Monday. CSW called and updated patient's son Iantha Fallen. He verbalized understanding and was agreeable to CSW starting SNF work up for back up plan if ALF won't take her back. He said anywhere but [facility].   Expected Discharge Plan: Assisted Living Barriers to Discharge: Continued Medical Work up   Patient Goals and CMS Choice Patient states their goals for this hospitalization and ongoing recovery are:: would prefer back to ALF with [facility] over SNF CMS Medicare.gov Compare Post Acute Care list provided to:: Patient Represenative (must comment) Choice offered to / list  presented to : [facility] POA / Guardian  Expected Discharge Plan and Services Expected Discharge Plan: Assisted Living       Living arrangements for the past 2 months: Assisted Living Facility                                      Prior Living Arrangements/Services Living arrangements for the past 2 months: Assisted Living Facility Lives with:: Facility Resident Patient language and need for interpreter reviewed:: Yes Do you feel safe going back to the place where you live?: Yes      Need for Family Participation in Patient Care: Yes (Comment) Care giver support system in place?: Yes (comment) Current home services: DME Criminal Activity/Legal Involvement Pertinent to Current Situation/Hospitalization: No - Comment as needed  Activities of Daily Living      Permission Sought/Granted Permission sought to share information with : Oceanographer granted to share information with : Yes, Designer, fashion/clothing (by son/HCPOA)     Permission granted to share info w AGENCY: [facility]        Emotional Assessment       Orientation: : Oriented to Self Alcohol / Substance Use: Not Applicable Psych Involvement: No (comment)  Admission diagnosis:  Urinary tract infection without hematuria, site unspecified [N39.0] Acute on chronic systolic (congestive) heart failure (HCC) [I50.23] Acute on chronic congestive heart failure, unspecified heart failure  type [facility]) [I50.9] Patient Active Problem List   Diagnosis Date Noted  . Acute on chronic systolic (congestive) heart failure (HCC) 05/10/2020  . Acute decompensated heart failure (HCC) 03/23/2019  . Hypothyroid 11/20/2018  . Protein calorie malnutrition (HCC) 11/20/2018  . AF (paroxysmal atrial fibrillation) (HCC) 11/20/2018  . Femur fracture, left (HCC) 11/19/2018  . UTI (urinary tract infection) 02/01/2017  . Chest pain 01/10/2017  . Dizziness 01/10/2017  . Ataxia 01/10/2017  . Cystocele,  midline 07/27/2014  . Menopause 07/27/2014  . Vaginal atrophy 07/27/2014  . Incomplete bladder emptying 07/27/2014  . Anxiety and depression 07/27/2014  . Constipation 07/27/2014  . Hypertension 07/27/2014  . Hypercholesterolemia 07/27/2014  . Hemorrhoids 07/27/2014  . Rectocele 07/27/2014  . Vaginal enterocele 07/27/2014   PCP:  [name], MD Pharmacy:   [facility] - [place], [place] - [address] [place] [address] Phone: [phone] Fax: [phone]     Social Determinants of Health (SDOH) Interventions    Readmission Risk Interventions No flowsheet data found.

## 2020-05-12 NOTE — Progress Notes (Signed)
[facility] Cardiology  SUBJECTIVE: Patient laying flat in bed, denies chest pain or shortness of breath, appears quite confused, oriented to name only   Vitals:   05/11/20 1704 05/11/20 1958 05/12/20 0425 05/12/20 0800  BP: (!) 128/107 (!) 142/96 (!) 136/100 (!) 148/101  Pulse: 63 64 72 72  Resp: 18 16  17   Temp:  98.5 F (36.9 C) 97.8 F (36.6 C) 97.7 F (36.5 C)  TempSrc:  Oral Oral   SpO2: 99% 99% 100% 99%  Weight:   70 kg   Height:         Intake/Output Summary (Last 24 hours) at 05/12/2020 05/14/2020 Last data filed at 05/11/2020 1830 Gross per 24 hour  Intake 0 ml  Output --  Net 0 ml      PHYSICAL EXAM  General: Well developed, well nourished, in no acute distress HEENT:  Normocephalic and atramatic Neck:  No JVD.  Lungs: Clear bilaterally to auscultation and percussion. Heart: HRRR . Normal S1 and S2 without gallops or murmurs.  Abdomen: Bowel sounds are positive, abdomen soft and non-tender  Msk:  Back normal, normal gait. Normal strength and tone for age. Extremities: No clubbing, cyanosis or edema.   Neuro: Alert and oriented X 3. Psych:  Good affect, responds appropriately   LABS: Basic Metabolic Panel: Recent Labs    05/11/20 0551 05/12/20 0442  NA 136 138  K 4.4 3.4*  CL 100 101  CO2 22 26  GLUCOSE 125* 113*  BUN 36* 31*  CREATININE 1.26* 1.13*  CALCIUM 9.3 8.7*  MG 1.9 1.9  PHOS 4.9*  --    Liver Function Tests: Recent Labs    05/10/20 1124  AST 67*  ALT 55*  ALKPHOS 125  BILITOT 0.9  PROT 6.0*  ALBUMIN 3.1*   No results for input(s): LIPASE, AMYLASE in the last 72 hours. CBC: Recent Labs    05/10/20 1124 05/11/20 0551 05/12/20 0442  WBC 7.3 11.2* 9.2  NEUTROABS 6.1  --  7.7  HGB 11.4* 11.6* 11.6*  HCT 35.5* 36.0 36.5  MCV 90.1 90.9 91.0  PLT 252 224 216   Cardiac Enzymes: No results for input(s): CKTOTAL, CKMB, CKMBINDEX, TROPONINI in the last 72 hours. BNP: Invalid input(s): POCBNP D-Dimer: No results for input(s): DDIMER in  the last 72 hours. Hemoglobin A1C: Recent Labs    05/11/20 0551  HGBA1C 5.8*   Fasting Lipid Panel: No results for input(s): CHOL, HDL, LDLCALC, TRIG, CHOLHDL, LDLDIRECT in the last 72 hours. Thyroid Function Tests: No results for input(s): TSH, T4TOTAL, T3FREE, THYROIDAB in the last 72 hours.  Invalid input(s): FREET3 Anemia Panel: No results for input(s): VITAMINB12, FOLATE, FERRITIN, TIBC, IRON, RETICCTPCT in the last 72 hours.  DG Chest Portable 1 View  Result Date: 05/10/2020 CLINICAL DATA:  Burning on urination. EXAM: PORTABLE CHEST 1 VIEW COMPARISON:  Single-view of the chest 05/26/2019. FINDINGS: There is cardiomegaly without edema. No consolidative process, pneumothorax or effusion. Aortic atherosclerosis. No acute or focal bony abnormality. IMPRESSION: No acute disease. Cardiomegaly. Aortic Atherosclerosis (ICD10-I70.0). Electronically Signed   By: 05/28/2019 M.D.   On: 05/10/2020 12:25   ECHOCARDIOGRAM COMPLETE  Result Date: 05/11/2020    ECHOCARDIOGRAM REPORT   Patient Name:   Deborah Martinez Date of Exam: 05/11/2020 Medical Rec #:  05/13/2020    Height:       65.0 in Accession #:    [id number]   Weight:       150.8 lb Date of Birth:  1927/09/05  BSA:          1.754 m Patient Age:    87 years     BP:           150/88 mmHg Patient Gender: F            HR:           60 bpm. Exam Location:  [facility] Procedure: 2D Echo, Color Doppler, Cardiac Doppler and Strain Analysis Indications:     I50.21 CHF-Acute Systolic  History:         Patient has prior history of Echocardiogram examinations, most                  recent 03/23/2019. CHF; Risk Factors:Hypertension and HCL.  Sonographer:     Humphrey Rolls RDCS (AE) Referring Phys:  [id number] Oliver Pila HALL Diagnosing Phys: Marcina Millard MD  Sonographer Comments: Global longitudinal strain was attempted. IMPRESSIONS  1. Left ventricular ejection fraction, by estimation, is <20%. The left ventricle has severely decreased function. The left  ventricle demonstrates global hypokinesis. The left ventricular internal cavity size was moderately to severely dilated. Left ventricular diastolic parameters were normal.  2. Right ventricular systolic function is normal. The right ventricular size is normal.  3. The mitral valve is normal in structure. Moderate to severe mitral valve regurgitation. No evidence of mitral stenosis.  4. Tricuspid valve regurgitation is moderate.  5. The aortic valve is normal in structure. Aortic valve regurgitation is not visualized. No aortic stenosis is present.  6. The inferior vena cava is normal in size with greater than 50% respiratory variability, suggesting right atrial pressure of 3 mmHg. FINDINGS  Left Ventricle: Left ventricular ejection fraction, by estimation, is <20%. The left ventricle has severely decreased function. The left ventricle demonstrates global hypokinesis. The left ventricular internal cavity size was moderately to severely dilated. There is no left ventricular hypertrophy. Left ventricular diastolic parameters were normal. Right Ventricle: The right ventricular size is normal. No increase in right ventricular wall thickness. Right ventricular systolic function is normal. Left Atrium: Left atrial size was normal in size. Right Atrium: Right atrial size was normal in size. Pericardium: There is no evidence of pericardial effusion. Mitral Valve: The mitral valve is normal in structure. Moderate to severe mitral valve regurgitation. No evidence of mitral valve stenosis. MV peak gradient, 2.1 mmHg. The mean mitral valve gradient is 1.0 mmHg. Tricuspid Valve: The tricuspid valve is normal in structure. Tricuspid valve regurgitation is moderate . No evidence of tricuspid stenosis. Aortic Valve: The aortic valve is normal in structure. Aortic valve regurgitation is not visualized. No aortic stenosis is present. Aortic valve mean gradient measures 6.0 mmHg. Aortic valve peak gradient measures 10.9 mmHg. Aortic  valve area, by VTI measures 0.92 cm. Pulmonic Valve: The pulmonic valve was normal in structure. Pulmonic valve regurgitation is not visualized. No evidence of pulmonic stenosis. Aorta: The aortic root is normal in size and structure. Venous: The inferior vena cava is normal in size with greater than 50% respiratory variability, suggesting right atrial pressure of 3 mmHg. IAS/Shunts: No atrial level shunt detected by color flow Doppler.  LEFT VENTRICLE PLAX 2D LVIDd:         4.60 cm  Diastology LVIDs:         4.50 cm  LV e' medial:    2.94 cm/s LV PW:         0.90 cm  LV E/e' medial:  27.4 LV IVS:  1.10 cm  LV e' lateral:   3.59 cm/s LVOT diam:     1.90 cm  LV E/e' lateral: 22.5 LV SV:         24 LV SV Index:   14 LVOT Area:     2.84 cm  RIGHT VENTRICLE RV Basal diam:  3.30 cm LEFT ATRIUM             Index       RIGHT ATRIUM           Index LA diam:        4.40 cm 2.51 cm/m  RA Area:     17.60 cm LA Vol (A2C):   37.1 ml 21.15 ml/m RA Volume:   41.20 ml  23.48 ml/m LA Vol (A4C):   67.8 ml 38.64 ml/m LA Biplane Vol: 54.6 ml 31.12 ml/m  AORTIC VALVE                    PULMONIC VALVE AV Area (Vmax):    1.00 cm     PV Vmax:       0.75 m/s AV Area (Vmean):   0.94 cm     PV Vmean:      50.500 cm/s AV Area (VTI):     0.92 cm     PV VTI:        0.128 m AV Vmax:           165.00 cm/s  PV Peak grad:  2.2 mmHg AV Vmean:          111.000 cm/s PV Mean grad:  1.0 mmHg AV VTI:            0.263 m AV Peak Grad:      10.9 mmHg AV Mean Grad:      6.0 mmHg LVOT Vmax:         58.10 cm/s LVOT Vmean:        36.900 cm/s LVOT VTI:          0.086 m LVOT/AV VTI ratio: 0.33  AORTA Ao Root diam: 3.10 cm MITRAL VALVE               TRICUSPID VALVE MV Area (PHT): 3.36 cm    TR Peak grad:   42.5 mmHg MV Area VTI:   1.23 cm    TR Vmax:        326.00 cm/s MV Peak grad:  2.1 mmHg MV Mean grad:  1.0 mmHg    SHUNTS MV Vmax:       0.72 m/s    Systemic VTI:  0.09 m MV Vmean:      41.0 cm/s   Systemic Diam: 1.90 cm MV Decel Time: 226 msec  MR Peak grad: 82.8 mmHg MR Mean grad: 49.0 mmHg MR Vmax:      455.00 cm/s MR Vmean:     327.0 cm/s MV E velocity: 80.60 cm/s MV A velocity: 42.00 cm/s MV E/A ratio:  1.92 Marcina Millard MD Electronically signed by Marcina Millard MD Signature Date/Time: 05/11/2020/3:10:34 PM    Final      Echo Dilated cardiomyopathy, with LVEF less than 20%, with moderate to severe mitral regurgitation, moderate tricuspid regurgitation  TELEMETRY: Sinus rhythm:  ASSESSMENT AND PLAN:  Active Problems:   Acute on chronic systolic (congestive) heart failure (HCC)    1. Acute on chronic systolic CHF, secondary to being off of diuretics due to recent admission for poor oral intake. The patient was restarted on IV Lasix  20 mg BID. BNP greater than 4500. 2D echocardiogram 05/11/2020 revealed LVEF less than 20% with moderate to severe MR and moderate TR.  Oxygen saturation 99% on room air, appears euvolemic, pain flat in bed, denies shortness of breath 2. Atrial fibrillation, currently in sinus rhythm, on metoprolol extended for rate control and Eliquis for stroke prevention. 3. Hypertension 4. Borderline elevated high sensitivity troponin, likely secondary to demand supply ischemia in setting of acute on chronic systolic CHF. ECG with nonspecific ST-T wave abnormalities; similar to previous ECG.  Patient denies chest pain. 5.  Dementia  Recommendations  1.  Agree with overall current therapy 2.  Continue diuresis 3.  Transition IV to p.o. furosemide 4.  Closely monitor renal status 5.  Continue Eliquis for stroke prevention 6.  Continue metoprolol succinate for rate control 7.  May consider discharge from cardiovascular perspective 8.  Follow-up with Dr. Gwen PoundsKowalski in 1 to 2 weeks    Marcina MillardAlexander Corynn Solberg, MD, PhD, [facility]fieldFACC 05/12/2020 8:32 AM

## 2020-05-12 NOTE — Progress Notes (Signed)
Mobility Specialist - Progress Note   05/12/20 1600  Mobility  Activity Dangled on edge of bed  Range of Motion/Exercises Right leg;Left leg (AP, SLR, HS, ABD)  Level of Assistance Standby assist, set-up cues, supervision of patient - no hands on  Assistive Device None  Distance Ambulated (ft) 0 ft  Mobility Response Tolerated well  Mobility performed by Mobility specialist  $Mobility charge 1 Mobility    Pt was sleeping upon arrival, easily awakened to voice. Attempted to sit EOB this session, however halfway through attempt pt began c/o feeling "really dizzy" and requested to return supine. BP checked: 145/91. Participated in bed-level therex: ankle pumps, straight leg raises, abduction, and heel slides. Noted that LLE appears weaker than RLE, pt voiced similar note stating "is there a reason that my left leg is harder to lift". Pt also c/o soreness in RLE whilst performing heel slides. Dizziness did resolve prior to exit. O2 96% on RA, max HR 68 bpm. Pt left in bed with alarm set. Rn notified.    Filiberto Pinks Mobility Specialist 05/12/20, 4:49 PM

## 2020-05-12 NOTE — Progress Notes (Addendum)
Progress Note    DANASIA BAKER  QPY:[id number] DOB: [birth year]  DOA: 05/10/2020 PCP: [name], MD      Brief Narrative:    Medical records reviewed and are as summarized below:  DORETHEA STRUBEL is a 87 y.o. female       Assessment/Plan:   Active Problems:   Acute on chronic systolic (congestive) heart failure (HCC)   Body mass index is 25.69 kg/m.   Acute on chronic systolic CHF Elevated troponin likely from demand ischemia Hypokalemia Abnormal urinalysis Elevated liver enzymes Type II DM with hyperglycemia A. fib with RVR, converted to NSR CKD stage IIIb Generalized weakness Other comorbidities include anxiety, depression, hypothyroidism, dementia.   PLAN  Change IV to oral Lasix.  Monitor BMP/electrolytes and urine output.  2D echo on 05/12/2018 showed EF estimated at less than 20%, moderate to severe mitral regurgitation, moderate tricuspid regurgitation.  Replete potassium and monitor levels.  No growth on urine culture.  Discontinue IV Rocephin.  Continue metoprolol and Eliquis for A. fib  NovoLog as needed for hyperglycemia  Continue antidepressants  PT and OT recommended discharge to SNF.  Plan of care was discussed with her son, Mr. [name].  He thinks patient will be able to go back to ALF because they have PT and OT services available.  Follow-up with social worker to assist with disposition.    Diet Order            Diet Heart Room service appropriate? Yes; Fluid consistency: Thin  Diet effective now                    Consultants:  Cardiologist  Procedures:  None    Medications:   . apixaban  2.5 mg Oral BID  . buPROPion  100 mg Oral Daily  . calcium carbonate  500 mg of elemental calcium Oral BID WC  . donepezil  10 mg Oral QHS  . furosemide  20 mg Oral BID  . insulin aspart  0-5 Units Subcutaneous QHS  . insulin aspart  0-9 Units Subcutaneous TID WC  . levothyroxine  50 mcg Oral Daily  . metoprolol  succinate  12.5 mg Oral Daily  . potassium chloride SA  20 mEq Oral Daily  . sertraline  100 mg Oral Daily   Continuous Infusions:    Anti-infectives (From admission, onward)   Start     Dose/Rate Route Frequency Ordered Stop   05/10/20 2315  cefTRIAXone (ROCEPHIN) 1 g in sodium chloride 0.9 % 100 mL IVPB  Status:  Discontinued        1 g 200 mL/hr over 30 Minutes Intravenous Daily-1800 05/10/20 2309 05/12/20 1030             Family Communication/Anticipated D/C date and plan/Code Status   DVT prophylaxis: apixaban (ELIQUIS) tablet 2.5 mg Start: 05/11/20 0000 apixaban (ELIQUIS) tablet 2.5 mg     Code Status: DNR  Family Communication: Son, Mr. Cliffton Asters Disposition Plan:    Status is: Inpatient  Remains inpatient appropriate because:IV treatments appropriate due to intensity of illness or inability to take PO and Inpatient level of care appropriate due to severity of illness   Dispo:  Patient From: Skilled Nursing Facility  Planned Disposition: Skilled Nursing Facility  Medically stable for discharge: No             Subjective:   Interval events noted.  No cough, shortness of breath or chest pain.  Objective:  Vitals:   05/11/20 1958 05/12/20 0425 05/12/20 0800 05/12/20 1157  BP: (!) 142/96 (!) 136/100 (!) 148/101 131/85  Pulse: 64 72 72 67  Resp: 16  17 16   Temp: 98.5 F (36.9 C) 97.8 F (36.6 C) 97.7 F (36.5 C) 98.3 F (36.8 C)  TempSrc: Oral Oral  Oral  SpO2: 99% 100% 99% 97%  Weight:  70 kg    Height:       No data found.   Intake/Output Summary (Last 24 hours) at 05/12/2020 1411 Last data filed at 05/12/2020 1032 Gross per 24 hour  Intake 240 ml  Output 300 ml  Net -60 ml   Filed Weights   05/10/20 1127 05/11/20 0516 05/12/20 0425  Weight: 59 kg 68.4 kg 70 kg    Exam:  GEN: NAD SKIN: Warm and dry EYES: No pallor or icterus ENT: MMM CV: RRR PULM: CTA B ABD: soft, ND, NT, +BS CNS: AAO x 3, non focal EXT: Bilateral leg  edema improving.  No tenderness        Data Reviewed:   I have personally reviewed following labs and imaging studies:  Labs: Labs show the following:   Basic Metabolic Panel: Recent Labs  Lab 05/10/20 1124 05/11/20 0551 05/12/20 0442  NA 135 136 138  K 5.0 4.4 3.4*  CL 100 100 101  CO2 24 22 26   GLUCOSE 179* 125* 113*  BUN 39* 36* 31*  CREATININE 1.17* 1.26* 1.13*  CALCIUM 9.8 9.3 8.7*  MG  --  1.9 1.9  PHOS  --  4.9*  --    GFR Estimated Creatinine Clearance: 32.5 mL/min (A) (by C-G formula based on SCr of 1.13 mg/dL (H)). Liver Function Tests: Recent Labs  Lab 05/10/20 1124  AST 67*  ALT 55*  ALKPHOS 125  BILITOT 0.9  PROT 6.0*  ALBUMIN 3.1*   No results for input(s): LIPASE, AMYLASE in the last 168 hours. No results for input(s): AMMONIA in the last 168 hours. Coagulation profile No results for input(s): INR, PROTIME in the last 168 hours.  CBC: Recent Labs  Lab 05/10/20 1124 05/11/20 0551 05/12/20 0442  WBC 7.3 11.2* 9.2  NEUTROABS 6.1  --  7.7  HGB 11.4* 11.6* 11.6*  HCT 35.5* 36.0 36.5  MCV 90.1 90.9 91.0  PLT 252 224 216   Cardiac Enzymes: No results for input(s): CKTOTAL, CKMB, CKMBINDEX, TROPONINI in the last 168 hours. BNP (last 3 results) No results for input(s): PROBNP in the last 8760 hours. CBG: Recent Labs  Lab 05/11/20 1156 05/11/20 1704 05/11/20 2120 05/12/20 0759 05/12/20 1159  GLUCAP 91 104* 84 133* 132*   D-Dimer: No results for input(s): DDIMER in the last 72 hours. Hgb A1c: Recent Labs    05/11/20 0551  HGBA1C 5.8*   Lipid Profile: No results for input(s): CHOL, HDL, LDLCALC, TRIG, CHOLHDL, LDLDIRECT in the last 72 hours. Thyroid function studies: No results for input(s): TSH, T4TOTAL, T3FREE, THYROIDAB in the last 72 hours.  Invalid input(s): FREET3 Anemia work up: No results for input(s): VITAMINB12, FOLATE, FERRITIN, TIBC, IRON, RETICCTPCT in the last 72 hours. Sepsis Labs: Recent Labs  Lab  05/10/20 1124 05/10/20 1711 05/11/20 0551 05/12/20 0442  WBC 7.3  --  11.2* 9.2  LATICACIDVEN 3.3* 2.0*  --   --     Microbiology Recent Results (from the past 240 hour(s))  Urine Culture     Status: None   Collection Time: 05/10/20  2:07 PM   Specimen: Urine, Clean Catch  Result Value Ref Range Status   Specimen Description   Final    URINE, CLEAN CATCH Performed at [facility], [address], [place], [place] [address]    Special Requests   Final    NONE Performed at [facility], [address] [place], [place] [address]    Culture   Final    NO GROWTH Performed at [facility], [address] [place], [place] [address]    Report Status 05/12/2020 FINAL  Final  Resp Panel by RT-PCR (Flu A&B, Covid) Nasopharyngeal Swab     Status: None   Collection Time: 05/10/20  2:31 PM   Specimen: Nasopharyngeal Swab; Nasopharyngeal(NP) swabs in vial transport medium  Result Value Ref Range Status   SARS Coronavirus 2 by RT PCR NEGATIVE NEGATIVE Final    Comment: (NOTE) SARS-CoV-2 target nucleic acids are NOT DETECTED.  The SARS-CoV-2 RNA is generally detectable in upper respiratory specimens during the acute phase of infection. The lowest concentration of SARS-CoV-2 viral copies this assay can detect is 138 copies/mL. A negative result does not preclude SARS-Cov-2 infection and should not be used as the sole basis for treatment or other patient management decisions. A negative result may occur with  improper specimen collection/handling, submission of specimen other than nasopharyngeal swab, presence of viral mutation(s) within the areas targeted by this assay, and inadequate number of viral copies(<138 copies/mL). A negative result must be combined with clinical observations, patient history, and epidemiological information. The expected result is Negative.  Fact Sheet for Patients:  BloggerCourse.com  Fact Sheet for  Healthcare Providers:  SeriousBroker.it  This test is no t yet approved or cleared by the [place] FDA and  has been authorized for detection and/or diagnosis of SARS-CoV-2 by FDA under an Emergency Use Authorization (EUA). This EUA will remain  in effect (meaning this test can be used) for the duration of the COVID-19 declaration under Section 564(b)(1) of the Act, 21 U.S.C.section 360bbb-3(b)(1), unless the authorization is terminated  or revoked sooner.       Influenza A by PCR NEGATIVE NEGATIVE Final   Influenza B by PCR NEGATIVE NEGATIVE Final    Comment: (NOTE) The Xpert Xpress SARS-CoV-2/FLU/RSV plus assay is intended as an aid in the diagnosis of influenza from Nasopharyngeal swab specimens and should not be used as a sole basis for treatment. Nasal washings and aspirates are unacceptable for Xpert Xpress SARS-CoV-2/FLU/RSV testing.  Fact Sheet for Patients: BloggerCourse.com  Fact Sheet for Healthcare Providers: SeriousBroker.it  This test is not yet approved or cleared by the [place] FDA and has been authorized for detection and/or diagnosis of SARS-CoV-2 by FDA under an Emergency Use Authorization (EUA). This EUA will remain in effect (meaning this test can be used) for the duration of the COVID-19 declaration under Section 564(b)(1) of the Act, 21 U.S.C. section 360bbb-3(b)(1), unless the authorization is terminated or revoked.  Performed at [facility], [address], [place], [place] [address]     Procedures and diagnostic studies:  ECHOCARDIOGRAM COMPLETE  Result Date: 05/11/2020    ECHOCARDIOGRAM REPORT   Patient Name:   KRYSTEENA STALKER Date of Exam: 05/11/2020 Medical Rec #:  [id number]    Height:       65.0 in Accession #:    [id number]   Weight:       150.8 lb Date of Birth:  1927/03/14    BSA:          1.754 m Patient  Age:    89 years     BP:           150/88  mmHg Patient Gender: F            HR:           60 bpm. Exam Location:  [facility] Procedure: 2D Echo, Color Doppler, Cardiac Doppler and Strain Analysis Indications:     I50.21 CHF-Acute Systolic  History:         Patient has prior history of Echocardiogram examinations, most                  recent 03/23/2019. CHF; Risk Factors:Hypertension and HCL.  Sonographer:     Humphrey Rolls RDCS (AE) Referring Phys:  [id number] Oliver Pila HALL Diagnosing Phys: Marcina Millard MD  Sonographer Comments: Global longitudinal strain was attempted. IMPRESSIONS  1. Left ventricular ejection fraction, by estimation, is <20%. The left ventricle has severely decreased function. The left ventricle demonstrates global hypokinesis. The left ventricular internal cavity size was moderately to severely dilated. Left ventricular diastolic parameters were normal.  2. Right ventricular systolic function is normal. The right ventricular size is normal.  3. The mitral valve is normal in structure. Moderate to severe mitral valve regurgitation. No evidence of mitral stenosis.  4. Tricuspid valve regurgitation is moderate.  5. The aortic valve is normal in structure. Aortic valve regurgitation is not visualized. No aortic stenosis is present.  6. The inferior vena cava is normal in size with greater than 50% respiratory variability, suggesting right atrial pressure of 3 mmHg. FINDINGS  Left Ventricle: Left ventricular ejection fraction, by estimation, is <20%. The left ventricle has severely decreased function. The left ventricle demonstrates global hypokinesis. The left ventricular internal cavity size was moderately to severely dilated. There is no left ventricular hypertrophy. Left ventricular diastolic parameters were normal. Right Ventricle: The right ventricular size is normal. No increase in right ventricular wall thickness. Right ventricular systolic function is normal. Left Atrium: Left atrial size was normal in size. Right Atrium: Right atrial  size was normal in size. Pericardium: There is no evidence of pericardial effusion. Mitral Valve: The mitral valve is normal in structure. Moderate to severe mitral valve regurgitation. No evidence of mitral valve stenosis. MV peak gradient, 2.1 mmHg. The mean mitral valve gradient is 1.0 mmHg. Tricuspid Valve: The tricuspid valve is normal in structure. Tricuspid valve regurgitation is moderate . No evidence of tricuspid stenosis. Aortic Valve: The aortic valve is normal in structure. Aortic valve regurgitation is not visualized. No aortic stenosis is present. Aortic valve mean gradient measures 6.0 mmHg. Aortic valve peak gradient measures 10.9 mmHg. Aortic valve area, by VTI measures 0.92 cm. Pulmonic Valve: The pulmonic valve was normal in structure. Pulmonic valve regurgitation is not visualized. No evidence of pulmonic stenosis. Aorta: The aortic root is normal in size and structure. Venous: The inferior vena cava is normal in size with greater than 50% respiratory variability, suggesting right atrial pressure of 3 mmHg. IAS/Shunts: No atrial level shunt detected by color flow Doppler.  LEFT VENTRICLE PLAX 2D LVIDd:         4.60 cm  Diastology LVIDs:         4.50 cm  LV e' medial:    2.94 cm/s LV PW:         0.90 cm  LV E/e' medial:  27.4 LV IVS:        1.10 cm  LV e' lateral:   3.59 cm/s LVOT  diam:     1.90 cm  LV E/e' lateral: 22.5 LV SV:         24 LV SV Index:   14 LVOT Area:     2.84 cm  RIGHT VENTRICLE RV Basal diam:  3.30 cm LEFT ATRIUM             Index       RIGHT ATRIUM           Index LA diam:        4.40 cm 2.51 cm/m  RA Area:     17.60 cm LA Vol (A2C):   37.1 ml 21.15 ml/m RA Volume:   41.20 ml  23.48 ml/m LA Vol (A4C):   67.8 ml 38.64 ml/m LA Biplane Vol: 54.6 ml 31.12 ml/m  AORTIC VALVE                    PULMONIC VALVE AV Area (Vmax):    1.00 cm     PV Vmax:       0.75 m/s AV Area (Vmean):   0.94 cm     PV Vmean:      50.500 cm/s AV Area (VTI):     0.92 cm     PV VTI:        0.128 m  AV Vmax:           165.00 cm/s  PV Peak grad:  2.2 mmHg AV Vmean:          111.000 cm/s PV Mean grad:  1.0 mmHg AV VTI:            0.263 m AV Peak Grad:      10.9 mmHg AV Mean Grad:      6.0 mmHg LVOT Vmax:         58.10 cm/s LVOT Vmean:        36.900 cm/s LVOT VTI:          0.086 m LVOT/AV VTI ratio: 0.33  AORTA Ao Root diam: 3.10 cm MITRAL VALVE               TRICUSPID VALVE MV Area (PHT): 3.36 cm    TR Peak grad:   42.5 mmHg MV Area VTI:   1.23 cm    TR Vmax:        326.00 cm/s MV Peak grad:  2.1 mmHg MV Mean grad:  1.0 mmHg    SHUNTS MV Vmax:       0.72 m/s    Systemic VTI:  0.09 m MV Vmean:      41.0 cm/s   Systemic Diam: 1.90 cm MV Decel Time: 226 msec MR Peak grad: 82.8 mmHg MR Mean grad: 49.0 mmHg MR Vmax:      455.00 cm/s MR Vmean:     327.0 cm/s MV E velocity: 80.60 cm/s MV A velocity: 42.00 cm/s MV E/A ratio:  1.92 Marcina MillardAlexander Paraschos MD Electronically signed by Marcina MillardAlexander Paraschos MD Signature Date/Time: 05/11/2020/3:10:34 PM    Final                LOS: 2 days   Rosalynd Mcwright  [facility]   Pager on [email or web address]. If 7PM-7AM, please contact night-coverage at [email or web address]     05/12/2020, 2:11 PM

## 2020-05-13 DIAGNOSIS — I5023 Acute on chronic systolic (congestive) heart failure: Secondary | ICD-10-CM | POA: Diagnosis not present

## 2020-05-13 LAB — GLUCOSE, CAPILLARY
Glucose-Capillary: 119 mg/dL — ABNORMAL HIGH (ref 70–99)
Glucose-Capillary: 131 mg/dL — ABNORMAL HIGH (ref 70–99)
Glucose-Capillary: 107 mg/dL — ABNORMAL HIGH (ref 70–99)
Glucose-Capillary: 138 mg/dL — ABNORMAL HIGH (ref 70–99)
Glucose-Capillary: 124 mg/dL — ABNORMAL HIGH (ref 70–99)

## 2020-05-13 LAB — BASIC METABOLIC PANEL WITH GFR
Anion gap: 9 (ref 5–15)
BUN: 27 mg/dL — ABNORMAL HIGH (ref 8–23)
CO2: 26 mmol/L (ref 22–32)
Calcium: 8.7 mg/dL — ABNORMAL LOW (ref 8.9–10.3)
Chloride: 101 mmol/L (ref 98–111)
Creatinine, Ser: 1.11 mg/dL — ABNORMAL HIGH (ref 0.44–1.00)
GFR, Estimated: 47 mL/min — ABNORMAL LOW (ref >60)
Glucose, Bld: 117 mg/dL — ABNORMAL HIGH (ref 70–99)
Potassium: 4.2 mmol/L (ref 3.5–5.1)
Sodium: 136 mmol/L (ref 135–145)

## 2020-05-13 NOTE — Progress Notes (Signed)
[facility] Cardiology  SUBJECTIVE: Patient laying flat in bed, denies chest pain or shortness of breath, continues to be confused, oriented to name only   Vitals:   05/12/20 1958 05/13/20 0029 05/13/20 0504 05/13/20 0744  BP: 136/86 (!) 142/111 (!) 156/101 (!) 155/106  Pulse: 64 75 70 74  Resp: 16  16 20   Temp: (!) 97.4 F (36.3 C)  98.5 F (36.9 C) 97.9 F (36.6 C)  TempSrc: Oral  Oral   SpO2: 100% 98% 97% 98%  Weight:   70.3 kg   Height:         Intake/Output Summary (Last 24 hours) at 05/13/2020 0849 Last data filed at 05/13/2020 07/13/2020 Gross per 24 hour  Intake 240 ml  Output 1500 ml  Net -1260 ml      PHYSICAL EXAM  General: Well developed, well nourished, in no acute distress HEENT:  Normocephalic and atramatic Neck:  No JVD.  Lungs: Clear bilaterally to auscultation and percussion. Heart: HRRR . Normal S1 and S2 without gallops or murmurs.  Abdomen: Bowel sounds are positive, abdomen soft and non-tender  Msk:  Back normal, normal gait. Normal strength and tone for age. Extremities: No clubbing, cyanosis or edema.   Neuro: Alert and oriented X 3. Psych:  Good affect, responds appropriately   LABS: Basic Metabolic Panel: Recent Labs    05/11/20 0551 05/12/20 0442 05/13/20 0443  NA 136 138 136  K 4.4 3.4* 4.2  CL 100 101 101  CO2 22 26 26   GLUCOSE 125* 113* 117*  BUN 36* 31* 27*  CREATININE 1.26* 1.13* 1.11*  CALCIUM 9.3 8.7* 8.7*  MG 1.9 1.9  --   PHOS 4.9*  --   --    Liver Function Tests: Recent Labs    05/10/20 1124  AST 67*  ALT 55*  ALKPHOS 125  BILITOT 0.9  PROT 6.0*  ALBUMIN 3.1*   No results for input(s): LIPASE, AMYLASE in the last 72 hours. CBC: Recent Labs    05/10/20 1124 05/11/20 0551 05/12/20 0442  WBC 7.3 11.2* 9.2  NEUTROABS 6.1  --  7.7  HGB 11.4* 11.6* 11.6*  HCT 35.5* 36.0 36.5  MCV 90.1 90.9 91.0  PLT 252 224 216   Cardiac Enzymes: No results for input(s): CKTOTAL, CKMB, CKMBINDEX, TROPONINI in the last 72  hours. BNP: Invalid input(s): POCBNP D-Dimer: No results for input(s): DDIMER in the last 72 hours. Hemoglobin A1C: Recent Labs    05/11/20 0551  HGBA1C 5.8*   Fasting Lipid Panel: No results for input(s): CHOL, HDL, LDLCALC, TRIG, CHOLHDL, LDLDIRECT in the last 72 hours. Thyroid Function Tests: No results for input(s): TSH, T4TOTAL, T3FREE, THYROIDAB in the last 72 hours.  Invalid input(s): FREET3 Anemia Panel: No results for input(s): VITAMINB12, FOLATE, FERRITIN, TIBC, IRON, RETICCTPCT in the last 72 hours.  ECHOCARDIOGRAM COMPLETE  Result Date: 05/11/2020    ECHOCARDIOGRAM REPORT   Patient Name:   Deborah Martinez Date of Exam: 05/11/2020 Medical Rec #:  Marene Lenz    Height:       65.0 in Accession #:    [id number]   Weight:       150.8 lb Date of Birth:  1927/11/16    BSA:          1.754 m Patient Age:    87 years     BP:           150/88 mmHg Patient Gender: F  HR:           60 bpm. Exam Location:  [facility] Procedure: 2D Echo, Color Doppler, Cardiac Doppler and Strain Analysis Indications:     I50.21 CHF-Acute Systolic  History:         Patient has prior history of Echocardiogram examinations, most                  recent 03/23/2019. CHF; Risk Factors:Hypertension and HCL.  Sonographer:     Humphrey Rolls RDCS (AE) Referring Phys:  [id number] Oliver Pila HALL Diagnosing Phys: Marcina Millard MD  Sonographer Comments: Global longitudinal strain was attempted. IMPRESSIONS  1. Left ventricular ejection fraction, by estimation, is <20%. The left ventricle has severely decreased function. The left ventricle demonstrates global hypokinesis. The left ventricular internal cavity size was moderately to severely dilated. Left ventricular diastolic parameters were normal.  2. Right ventricular systolic function is normal. The right ventricular size is normal.  3. The mitral valve is normal in structure. Moderate to severe mitral valve regurgitation. No evidence of mitral stenosis.  4. Tricuspid valve  regurgitation is moderate.  5. The aortic valve is normal in structure. Aortic valve regurgitation is not visualized. No aortic stenosis is present.  6. The inferior vena cava is normal in size with greater than 50% respiratory variability, suggesting right atrial pressure of 3 mmHg. FINDINGS  Left Ventricle: Left ventricular ejection fraction, by estimation, is <20%. The left ventricle has severely decreased function. The left ventricle demonstrates global hypokinesis. The left ventricular internal cavity size was moderately to severely dilated. There is no left ventricular hypertrophy. Left ventricular diastolic parameters were normal. Right Ventricle: The right ventricular size is normal. No increase in right ventricular wall thickness. Right ventricular systolic function is normal. Left Atrium: Left atrial size was normal in size. Right Atrium: Right atrial size was normal in size. Pericardium: There is no evidence of pericardial effusion. Mitral Valve: The mitral valve is normal in structure. Moderate to severe mitral valve regurgitation. No evidence of mitral valve stenosis. MV peak gradient, 2.1 mmHg. The mean mitral valve gradient is 1.0 mmHg. Tricuspid Valve: The tricuspid valve is normal in structure. Tricuspid valve regurgitation is moderate . No evidence of tricuspid stenosis. Aortic Valve: The aortic valve is normal in structure. Aortic valve regurgitation is not visualized. No aortic stenosis is present. Aortic valve mean gradient measures 6.0 mmHg. Aortic valve peak gradient measures 10.9 mmHg. Aortic valve area, by VTI measures 0.92 cm. Pulmonic Valve: The pulmonic valve was normal in structure. Pulmonic valve regurgitation is not visualized. No evidence of pulmonic stenosis. Aorta: The aortic root is normal in size and structure. Venous: The inferior vena cava is normal in size with greater than 50% respiratory variability, suggesting right atrial pressure of 3 mmHg. IAS/Shunts: No atrial level  shunt detected by color flow Doppler.  LEFT VENTRICLE PLAX 2D LVIDd:         4.60 cm  Diastology LVIDs:         4.50 cm  LV e' medial:    2.94 cm/s LV PW:         0.90 cm  LV E/e' medial:  27.4 LV IVS:        1.10 cm  LV e' lateral:   3.59 cm/s LVOT diam:     1.90 cm  LV E/e' lateral: 22.5 LV SV:         24 LV SV Index:   14 LVOT Area:     2.84 cm  RIGHT VENTRICLE RV Basal diam:  3.30 cm LEFT ATRIUM             Index       RIGHT ATRIUM           Index LA diam:        4.40 cm 2.51 cm/m  RA Area:     17.60 cm LA Vol (A2C):   37.1 ml 21.15 ml/m RA Volume:   41.20 ml  23.48 ml/m LA Vol (A4C):   67.8 ml 38.64 ml/m LA Biplane Vol: 54.6 ml 31.12 ml/m  AORTIC VALVE                    PULMONIC VALVE AV Area (Vmax):    1.00 cm     PV Vmax:       0.75 m/s AV Area (Vmean):   0.94 cm     PV Vmean:      50.500 cm/s AV Area (VTI):     0.92 cm     PV VTI:        0.128 m AV Vmax:           165.00 cm/s  PV Peak grad:  2.2 mmHg AV Vmean:          111.000 cm/s PV Mean grad:  1.0 mmHg AV VTI:            0.263 m AV Peak Grad:      10.9 mmHg AV Mean Grad:      6.0 mmHg LVOT Vmax:         58.10 cm/s LVOT Vmean:        36.900 cm/s LVOT VTI:          0.086 m LVOT/AV VTI ratio: 0.33  AORTA Ao Root diam: 3.10 cm MITRAL VALVE               TRICUSPID VALVE MV Area (PHT): 3.36 cm    TR Peak grad:   42.5 mmHg MV Area VTI:   1.23 cm    TR Vmax:        326.00 cm/s MV Peak grad:  2.1 mmHg MV Mean grad:  1.0 mmHg    SHUNTS MV Vmax:       0.72 m/s    Systemic VTI:  0.09 m MV Vmean:      41.0 cm/s   Systemic Diam: 1.90 cm MV Decel Time: 226 msec MR Peak grad: 82.8 mmHg MR Mean grad: 49.0 mmHg MR Vmax:      455.00 cm/s MR Vmean:     327.0 cm/s MV E velocity: 80.60 cm/s MV A velocity: 42.00 cm/s MV E/A ratio:  1.92 Marcina MillardAlexander Wynema Garoutte MD Electronically signed by Marcina MillardAlexander Riti Rollyson MD Signature Date/Time: 05/11/2020/3:10:34 PM    Final      Echo dilated cardiomyopathy, LVEF <20%, moderate to severe mitral regurgitation  TELEMETRY: Sinus  rhythm:  ASSESSMENT AND PLAN:  Active Problems:   Acute on chronic systolic (congestive) heart failure (HCC)    1. Acute on chronic systolic CHF, secondary to being off of diuretics due to recent admission for poor oral intake. The patient was restarted on IV Lasix 20 mg BID, transition to p.o. furosemide.  BNP greater than 4500. 2D echocardiogram 05/11/2020 revealed LVEF less than 20% with moderate to severe MR and moderate TR.  Oxygen saturation 99% on room air, appears euvolemic, pain flat in bed, denies shortness of breath.  24-hour net I/O -700 cc. 2. Atrial fibrillation,currently in sinus  rhythm, on metoprolol extended for rate control and Eliquis for stroke prevention. 3. Hypertension 4. Borderline elevated high sensitivity troponin, likely secondary to demand supply ischemia in setting of acute on chronic systolic CHF. ECG with nonspecific ST-T wave abnormalities; similar to previous ECG.  Patient denies chest pain. 5.  Dementia, confused, oriented name only  Recommendations  1.  Agree with overall current therapy 2.  Continue diuresis 3.  Continue p.o. furosemide 4.  Closely monitor renal status 5.  Continue Eliquis for stroke prevention 6.  Continue metoprolol succinate for rate control 7.  May consider discharge from cardiovascular perspective 8.  Follow-up with Dr. Gwen Pounds in 1 to 2 weeks   Marcina Millard, MD, PhD, [facility] 05/13/2020 8:49 AM

## 2020-05-13 NOTE — Progress Notes (Signed)
Reported to me by Darel Hong RN that this patient is no longer transferring to 1C

## 2020-05-13 NOTE — Progress Notes (Signed)
Report received from [facility] , pt to transfer to 1C room 129 for continued medical treatment

## 2020-05-13 NOTE — Progress Notes (Addendum)
Progress Note    Deborah Martinez  UJW:[id number] DOB: May 01, 1927  DOA: 05/10/2020 PCP: [name], MD      Brief Narrative:    Medical records reviewed and are as summarized below:  Deborah Martinez is a 87 y.o. female       Assessment/Plan:   Active Problems:   Acute on chronic systolic (congestive) heart failure (HCC)   Body mass index is 25.78 kg/m.   Acute on chronic systolic CHF Elevated troponin likely from demand ischemia Hypokalemia Abnormal urinalysis Elevated liver enzymes Type II DM with hyperglycemia A. fib with RVR, converted to NSR CKD stage IIIb Generalized weakness Other comorbidities include anxiety, depression, hypothyroidism, dementia.   PLAN  Continue oral Lasix.   2D echo on 05/12/2018 showed EF estimated at less than 20%, moderate to severe mitral regurgitation, moderate tricuspid regurgitation.  Potassium has improved.  No growth on urine cultures IV Rocephin was discontinued on 05/12/2020.  Continue metoprolol and Eliquis for A. fib  NovoLog as needed for hyperglycemia  Continue antidepressants  PT and OT recommended discharge to SNF.  Awaiting placement to ALF or SNF.  Follow-up with social worker to assist with disposition.    Diet Order            Diet Heart Room service appropriate? Yes; Fluid consistency: Thin  Diet effective now                    Consultants:  Cardiologist  Procedures:  None    Medications:   . apixaban  2.5 mg Oral BID  . buPROPion  100 mg Oral Daily  . calcium carbonate  500 mg of elemental calcium Oral BID WC  . donepezil  10 mg Oral QHS  . furosemide  20 mg Oral BID  . insulin aspart  0-5 Units Subcutaneous QHS  . insulin aspart  0-9 Units Subcutaneous TID WC  . levothyroxine  50 mcg Oral Daily  . metoprolol succinate  12.5 mg Oral Daily  . potassium chloride SA  20 mEq Oral Daily  . sertraline  100 mg Oral Daily   Continuous Infusions:    Anti-infectives (From  admission, onward)   Start     Dose/Rate Route Frequency Ordered Stop   05/10/20 2315  cefTRIAXone (ROCEPHIN) 1 g in sodium chloride 0.9 % 100 mL IVPB  Status:  Discontinued        1 g 200 mL/hr over 30 Minutes Intravenous Daily-1800 05/10/20 2309 05/12/20 1030             Family Communication/Anticipated D/C date and plan/Code Status   DVT prophylaxis: apixaban (ELIQUIS) tablet 2.5 mg Start: 05/11/20 0000 apixaban (ELIQUIS) tablet 2.5 mg     Code Status: DNR  Family Communication: None Disposition Plan:    Status is: Inpatient  Remains inpatient appropriate because:IV treatments appropriate due to intensity of illness or inability to take PO and Inpatient level of care appropriate due to severity of illness   Dispo:  Patient From: Skilled Nursing Facility  Planned Disposition: Skilled Nursing Facility  Medically stable for discharge: No             Subjective:   Interval events noted.  No complaints.  No shortness of breath or chest pain.  She feels better.  Objective:    Vitals:   05/13/20 0029 05/13/20 0504 05/13/20 0744 05/13/20 1126  BP: (!) 142/111 (!) 156/101 (!) 155/106 (!) 144/100  Pulse: 75 70 74 78  Resp:  16 20 15   Temp:  98.5 F (36.9 C) 97.9 F (36.6 C) 97.9 F (36.6 C)  TempSrc:  Oral    SpO2: 98% 97% 98% 97%  Weight:  70.3 kg    Height:       No data found.   Intake/Output Summary (Last 24 hours) at 05/13/2020 1135 Last data filed at 05/13/2020 07/13/2020 Gross per 24 hour  Intake --  Output 1200 ml  Net -1200 ml   Filed Weights   05/11/20 0516 05/12/20 0425 05/13/20 0504  Weight: 68.4 kg 70 kg 70.3 kg    Exam:  GEN: NAD SKIN: Warm and dry EYES: No pallor or icterus ENT: MMM CV: RRR PULM: CTA B ABD: soft, ND, NT, +BS CNS: AAO x 3 but she is forgetful, non focal EXT: No edema or tenderness         Data Reviewed:   I have personally reviewed following labs and imaging studies:  Labs: Labs show the following:    Basic Metabolic Panel: Recent Labs  Lab 05/10/20 1124 05/11/20 0551 05/12/20 0442 05/13/20 0443  NA 135 136 138 136  K 5.0 4.4 3.4* 4.2  CL 100 100 101 101  CO2 24 22 26 26   GLUCOSE 179* 125* 113* 117*  BUN 39* 36* 31* 27*  CREATININE 1.17* 1.26* 1.13* 1.11*  CALCIUM 9.8 9.3 8.7* 8.7*  MG  --  1.9 1.9  --   PHOS  --  4.9*  --   --    GFR Estimated Creatinine Clearance: 33.1 mL/min (A) (by C-G formula based on SCr of 1.11 mg/dL (H)). Liver Function Tests: Recent Labs  Lab 05/10/20 1124  AST 67*  ALT 55*  ALKPHOS 125  BILITOT 0.9  PROT 6.0*  ALBUMIN 3.1*   No results for input(s): LIPASE, AMYLASE in the last 168 hours. No results for input(s): AMMONIA in the last 168 hours. Coagulation profile No results for input(s): INR, PROTIME in the last 168 hours.  CBC: Recent Labs  Lab 05/10/20 1124 05/11/20 0551 05/12/20 0442  WBC 7.3 11.2* 9.2  NEUTROABS 6.1  --  7.7  HGB 11.4* 11.6* 11.6*  HCT 35.5* 36.0 36.5  MCV 90.1 90.9 91.0  PLT 252 224 216   Cardiac Enzymes: No results for input(s): CKTOTAL, CKMB, CKMBINDEX, TROPONINI in the last 168 hours. BNP (last 3 results) No results for input(s): PROBNP in the last 8760 hours. CBG: Recent Labs  Lab 05/12/20 1159 05/12/20 1557 05/12/20 2155 05/13/20 0746 05/13/20 1130  GLUCAP 132* 122* 138* 119* 131*   D-Dimer: No results for input(s): DDIMER in the last 72 hours. Hgb A1c: Recent Labs    05/11/20 0551  HGBA1C 5.8*   Lipid Profile: No results for input(s): CHOL, HDL, LDLCALC, TRIG, CHOLHDL, LDLDIRECT in the last 72 hours. Thyroid function studies: No results for input(s): TSH, T4TOTAL, T3FREE, THYROIDAB in the last 72 hours.  Invalid input(s): FREET3 Anemia work up: No results for input(s): VITAMINB12, FOLATE, FERRITIN, TIBC, IRON, RETICCTPCT in the last 72 hours. Sepsis Labs: Recent Labs  Lab 05/10/20 1124 05/10/20 1711 05/11/20 0551 05/12/20 0442  WBC 7.3  --  11.2* 9.2  LATICACIDVEN 3.3*  2.0*  --   --     Microbiology Recent Results (from the past 240 hour(s))  Urine Culture     Status: None   Collection Time: 05/10/20  2:07 PM   Specimen: Urine, Clean Catch  Result Value Ref Range Status   Specimen Description   Final  URINE, CLEAN CATCH Performed at [facility], [address] [place], [place] [address]    Special Requests   Final    NONE Performed at [facility], [address] [place], [place] [address]    Culture   Final    NO GROWTH Performed at [facility], [address] [place], [place] [address]    Report Status 05/12/2020 FINAL  Final  Resp Panel by RT-PCR (Flu A&B, Covid) Nasopharyngeal Swab     Status: None   Collection Time: 05/10/20  2:31 PM   Specimen: Nasopharyngeal Swab; Nasopharyngeal(NP) swabs in vial transport medium  Result Value Ref Range Status   SARS Coronavirus 2 by RT PCR NEGATIVE NEGATIVE Final    Comment: (NOTE) SARS-CoV-2 target nucleic acids are NOT DETECTED.  The SARS-CoV-2 RNA is generally detectable in upper respiratory specimens during the acute phase of infection. The lowest concentration of SARS-CoV-2 viral copies this assay can detect is 138 copies/mL. A negative result does not preclude SARS-Cov-2 infection and should not be used as the sole basis for treatment or other patient management decisions. A negative result may occur with  improper specimen collection/handling, submission of specimen other than nasopharyngeal swab, presence of viral mutation(s) within the areas targeted by this assay, and inadequate number of viral copies(<138 copies/mL). A negative result must be combined with clinical observations, patient history, and epidemiological information. The expected result is Negative.  Fact Sheet for Patients:  BloggerCourse.com  Fact Sheet for Healthcare Providers:  SeriousBroker.it  This test is no t yet approved or  cleared by the [place] FDA and  has been authorized for detection and/or diagnosis of SARS-CoV-2 by FDA under an Emergency Use Authorization (EUA). This EUA will remain  in effect (meaning this test can be used) for the duration of the COVID-19 declaration under Section 564(b)(1) of the Act, 21 U.S.C.section 360bbb-3(b)(1), unless the authorization is terminated  or revoked sooner.       Influenza A by PCR NEGATIVE NEGATIVE Final   Influenza B by PCR NEGATIVE NEGATIVE Final    Comment: (NOTE) The Xpert Xpress SARS-CoV-2/FLU/RSV plus assay is intended as an aid in the diagnosis of influenza from Nasopharyngeal swab specimens and should not be used as a sole basis for treatment. Nasal washings and aspirates are unacceptable for Xpert Xpress SARS-CoV-2/FLU/RSV testing.  Fact Sheet for Patients: BloggerCourse.com  Fact Sheet for Healthcare Providers: SeriousBroker.it  This test is not yet approved or cleared by the [place] FDA and has been authorized for detection and/or diagnosis of SARS-CoV-2 by FDA under an Emergency Use Authorization (EUA). This EUA will remain in effect (meaning this test can be used) for the duration of the COVID-19 declaration under Section 564(b)(1) of the Act, 21 U.S.C. section 360bbb-3(b)(1), unless the authorization is terminated or revoked.  Performed at [facility], [address], [place], [place] [address]     Procedures and diagnostic studies:  ECHOCARDIOGRAM COMPLETE  Result Date: 05/11/2020    ECHOCARDIOGRAM REPORT   Patient Name:   Deborah Martinez Date of Exam: 05/11/2020 Medical Rec #:  [id number]    Height:       65.0 in Accession #:    [id number]   Weight:       150.8 lb Date of Birth:  02-Sep-1927    BSA:          1.754 m Patient Age:    89 years     BP:  150/88 mmHg Patient Gender: F            HR:           60 bpm. Exam Location:  [facility] Procedure: 2D Echo, Color  Doppler, Cardiac Doppler and Strain Analysis Indications:     I50.21 CHF-Acute Systolic  History:         Patient has prior history of Echocardiogram examinations, most                  recent 03/23/2019. CHF; Risk Factors:Hypertension and HCL.  Sonographer:     Humphrey RollsJoan Heiss RDCS (AE) Referring Phys:  [id number] Oliver PilaAROLE N HALL Diagnosing Phys: Marcina MillardAlexander Paraschos MD  Sonographer Comments: Global longitudinal strain was attempted. IMPRESSIONS  1. Left ventricular ejection fraction, by estimation, is <20%. The left ventricle has severely decreased function. The left ventricle demonstrates global hypokinesis. The left ventricular internal cavity size was moderately to severely dilated. Left ventricular diastolic parameters were normal.  2. Right ventricular systolic function is normal. The right ventricular size is normal.  3. The mitral valve is normal in structure. Moderate to severe mitral valve regurgitation. No evidence of mitral stenosis.  4. Tricuspid valve regurgitation is moderate.  5. The aortic valve is normal in structure. Aortic valve regurgitation is not visualized. No aortic stenosis is present.  6. The inferior vena cava is normal in size with greater than 50% respiratory variability, suggesting right atrial pressure of 3 mmHg. FINDINGS  Left Ventricle: Left ventricular ejection fraction, by estimation, is <20%. The left ventricle has severely decreased function. The left ventricle demonstrates global hypokinesis. The left ventricular internal cavity size was moderately to severely dilated. There is no left ventricular hypertrophy. Left ventricular diastolic parameters were normal. Right Ventricle: The right ventricular size is normal. No increase in right ventricular wall thickness. Right ventricular systolic function is normal. Left Atrium: Left atrial size was normal in size. Right Atrium: Right atrial size was normal in size. Pericardium: There is no evidence of pericardial effusion. Mitral Valve: The  mitral valve is normal in structure. Moderate to severe mitral valve regurgitation. No evidence of mitral valve stenosis. MV peak gradient, 2.1 mmHg. The mean mitral valve gradient is 1.0 mmHg. Tricuspid Valve: The tricuspid valve is normal in structure. Tricuspid valve regurgitation is moderate . No evidence of tricuspid stenosis. Aortic Valve: The aortic valve is normal in structure. Aortic valve regurgitation is not visualized. No aortic stenosis is present. Aortic valve mean gradient measures 6.0 mmHg. Aortic valve peak gradient measures 10.9 mmHg. Aortic valve area, by VTI measures 0.92 cm. Pulmonic Valve: The pulmonic valve was normal in structure. Pulmonic valve regurgitation is not visualized. No evidence of pulmonic stenosis. Aorta: The aortic root is normal in size and structure. Venous: The inferior vena cava is normal in size with greater than 50% respiratory variability, suggesting right atrial pressure of 3 mmHg. IAS/Shunts: No atrial level shunt detected by color flow Doppler.  LEFT VENTRICLE PLAX 2D LVIDd:         4.60 cm  Diastology LVIDs:         4.50 cm  LV e' medial:    2.94 cm/s LV PW:         0.90 cm  LV E/e' medial:  27.4 LV IVS:        1.10 cm  LV e' lateral:   3.59 cm/s LVOT diam:     1.90 cm  LV E/e' lateral: 22.5 LV SV:  24 LV SV Index:   14 LVOT Area:     2.84 cm  RIGHT VENTRICLE RV Basal diam:  3.30 cm LEFT ATRIUM             Index       RIGHT ATRIUM           Index LA diam:        4.40 cm 2.51 cm/m  RA Area:     17.60 cm LA Vol (A2C):   37.1 ml 21.15 ml/m RA Volume:   41.20 ml  23.48 ml/m LA Vol (A4C):   67.8 ml 38.64 ml/m LA Biplane Vol: 54.6 ml 31.12 ml/m  AORTIC VALVE                    PULMONIC VALVE AV Area (Vmax):    1.00 cm     PV Vmax:       0.75 m/s AV Area (Vmean):   0.94 cm     PV Vmean:      50.500 cm/s AV Area (VTI):     0.92 cm     PV VTI:        0.128 m AV Vmax:           165.00 cm/s  PV Peak grad:  2.2 mmHg AV Vmean:          111.000 cm/s PV Mean grad:   1.0 mmHg AV VTI:            0.263 m AV Peak Grad:      10.9 mmHg AV Mean Grad:      6.0 mmHg LVOT Vmax:         58.10 cm/s LVOT Vmean:        36.900 cm/s LVOT VTI:          0.086 m LVOT/AV VTI ratio: 0.33  AORTA Ao Root diam: 3.10 cm MITRAL VALVE               TRICUSPID VALVE MV Area (PHT): 3.36 cm    TR Peak grad:   42.5 mmHg MV Area VTI:   1.23 cm    TR Vmax:        326.00 cm/s MV Peak grad:  2.1 mmHg MV Mean grad:  1.0 mmHg    SHUNTS MV Vmax:       0.72 m/s    Systemic VTI:  0.09 m MV Vmean:      41.0 cm/s   Systemic Diam: 1.90 cm MV Decel Time: 226 msec MR Peak grad: 82.8 mmHg MR Mean grad: 49.0 mmHg MR Vmax:      455.00 cm/s MR Vmean:     327.0 cm/s MV E velocity: 80.60 cm/s MV A velocity: 42.00 cm/s MV E/A ratio:  1.92 Marcina Millard MD Electronically signed by Marcina Millard MD Signature Date/Time: 05/11/2020/3:10:34 PM    Final                LOS: 3 days   Paisleigh Maroney  [facility]   Pager on [email or web address]. If 7PM-7AM, please contact night-coverage at [email or web address]     05/13/2020, 11:35 AM

## 2020-05-14 ENCOUNTER — Ambulatory Visit: Payer: Medicare Other | Admitting: Family

## 2020-05-14 DIAGNOSIS — I5023 Acute on chronic systolic (congestive) heart failure: Secondary | ICD-10-CM | POA: Diagnosis not present

## 2020-05-14 LAB — GLUCOSE, CAPILLARY
Glucose-Capillary: 113 mg/dL — ABNORMAL HIGH (ref 70–99)
Glucose-Capillary: 176 mg/dL — ABNORMAL HIGH (ref 70–99)

## 2020-05-14 LAB — BASIC METABOLIC PANEL
Anion gap: 10 (ref 5–15)
BUN: 23 mg/dL (ref 8–23)
CO2: 25 mmol/L (ref 22–32)
Calcium: 8.8 mg/dL — ABNORMAL LOW (ref 8.9–10.3)
Chloride: 101 mmol/L (ref 98–111)
Creatinine, Ser: 0.93 mg/dL (ref 0.44–1.00)
GFR, Estimated: 58 mL/min — ABNORMAL LOW (ref >60)
Glucose, Bld: 112 mg/dL — ABNORMAL HIGH (ref 70–99)
Potassium: 4 mmol/L (ref 3.5–5.1)
Sodium: 136 mmol/L (ref 135–145)

## 2020-05-14 LAB — RESP PANEL BY RT-PCR (FLU A&B, COVID) ARPGX2
Influenza A by PCR: NEGATIVE
Influenza B by PCR: NEGATIVE
SARS Coronavirus 2 by RT PCR: NEGATIVE

## 2020-05-14 MED ORDER — FUROSEMIDE 20 MG PO TABS
20.0000 mg | ORAL_TABLET | Freq: Two times a day (BID) | ORAL | Status: AC
Start: 2020-05-14 — End: ?

## 2020-05-14 NOTE — Care Management Important Message (Signed)
Important Message  Patient Details  Name: Deborah Martinez MRN: [id number] Date of Birth: May 07, 1927   Medicare Important Message Given:  Yes     [name] 05/14/2020, 11:54 AM

## 2020-05-14 NOTE — TOC Progression Note (Signed)
Transition of Care [facility]) - Progression Note    Patient Details  Name: RAECHELL SINGLETON MRN: [id number] Date of Birth: December 22, 1927  Transition of [facility]) CM/SW Contact  Maree Krabbe, LCSW Phone Number: 05/14/2020, 2:20 PM  Clinical Narrative:   CSW spoke with Ed from [facility] and he states he came to assess pt and believes at this time pt is more appropriate for SNF. CSW called pt's son and notified him and read off the bed offers. The pt's son has selected [facility]. MD notified.    Expected Discharge Plan: Assisted Living Barriers to Discharge: Continued Medical Work up  Expected Discharge Plan and Services Expected Discharge Plan: Assisted Living       Living arrangements for the past 2 months: Assisted Living Facility                                       Social Determinants of Health (SDOH) Interventions    Readmission Risk Interventions No flowsheet data found.

## 2020-05-14 NOTE — Progress Notes (Signed)
PT Cancellation Note  Patient Details Name: Deborah Martinez MRN: [id number] DOB: 11/08/1927   Cancelled Treatment:    Reason Eval/Treat Not Completed: Fatigue/lethargy limiting ability to participate   Attempted this am and pm.  Pt refused each session. On 2nd attempt she stated she was at the [facility].  Attempted to orientate pt but she continued to refuse.    Danielle Dess 05/14/2020, 3:29 PM

## 2020-05-14 NOTE — Discharge Summary (Signed)
Physician Discharge Summary  Deborah Martinez:[id number] DOB: 02/16/1927 DOA: 05/10/2020  PCP: [name], MD  Admit date: 05/10/2020 Discharge date: 05/14/2020  Discharge disposition: SNF   Recommendations for Outpatient Follow-Up:   Follow up with physician at the nursing home within 3 days of discharge.  Follow up with cardiologist (office will call to schedule)   Discharge Diagnosis:   Active Problems:   Acute on chronic systolic (congestive) heart failure (HCC)    Discharge Condition: Stable.  Diet recommendation:  Diet Order            Diet - low sodium heart healthy           Diet Heart Room service appropriate? Yes; Fluid consistency: Thin  Diet effective now                   Code Status: DNR     Hospital Course:   Ms. Deborah Martinez is a 87 year old woman with medical history significant for dementia, chronic systolic CHF, permanent atrial fibrillation on Eliquis, hyperlipidemia, hypertension, iron deficiency anemia, anxiety, depression, hypothyroidism, recurrent UTI.  She presented to the hospital from the assisted living facility because of shallow breathing.  She was admitted to the hospital for acute exacerbation of chronic systolic CHF.  Troponins were mildly elevated but this was attributed to demand ischemia.  She was treated with IV Lasix.  She had atrial fibrillation with RVR but she converted to normal sinus rhythm.  2D echo showed EF estimated at less than 20%, moderate to severe mitral regurgitation, moderate tricuspid regurgitation. Cardiologist was consulted to assist with management.  Initially, there was concern for UTI because of abnormal urinalysis so she was treated with empiric IV antibiotics.  However, urine culture was unremarkable and antibiotics were discontinued because she was deemed not to have UTI.  She was evaluated by PT and OT who recommended further rehabilitation at the skilled nursing facility.  Her condition has improved and  she is deemed stable for discharge to SNF.    Medical Consultants:    Cardiologist   Discharge Exam:    Vitals:   05/14/20 0403 05/14/20 0728 05/14/20 1138 05/14/20 1355  BP: (!) 152/94 (!) 137/107 (!) 133/92   Pulse: 84 83 83   Resp: Temp: 97.9 F (36.6 C) 98.9 F (37.2 C) 99.1 F (37.3 C)   TempSrc:  Oral Oral   SpO2: 93% 95% 95%   Weight: 69 kg   68.6 kg  Height:         GEN: NAD SKIN: Warm and dry EYES: No pallor or icterus ENT: MMM CV: RRR PULM: CTA B ABD: soft, ND, NT, +BS CNS: AAO x 3, non focal EXT: No edema or tenderness   The results of significant diagnostics from this hospitalization (including imaging, microbiology, ancillary and laboratory) are listed below for reference.     Procedures and Diagnostic Studies:   ECHOCARDIOGRAM COMPLETE  Result Date: 05/11/2020    ECHOCARDIOGRAM REPORT   Patient Name:   Deborah Martinez Date of Exam: 05/11/2020 Medical Rec #:  [id number]    Height:       65.0 in Accession #:    [id number]   Weight:       150.8 lb Date of Birth:  Jan 10, 1928    BSA:          1.754 m Patient Age:    87 years     BP:  150/88 mmHg Patient Gender: F            HR:           60 bpm. Exam Location:  [facility] Procedure: 2D Echo, Color Doppler, Cardiac Doppler and Strain Analysis Indications:     I50.21 CHF-Acute Systolic  History:         Patient has prior history of Echocardiogram examinations, most                  recent 03/23/2019. CHF; Risk Factors:Hypertension and HCL.  Sonographer:     Humphrey Rolls RDCS (AE) Referring Phys:  [id number] Oliver Pila HALL Diagnosing Phys: Marcina Millard MD  Sonographer Comments: Global longitudinal strain was attempted. IMPRESSIONS  1. Left ventricular ejection fraction, by estimation, is <20%. The left ventricle has severely decreased function. The left ventricle demonstrates global hypokinesis. The left ventricular internal cavity size was moderately to severely dilated. Left ventricular diastolic  parameters were normal.  2. Right ventricular systolic function is normal. The right ventricular size is normal.  3. The mitral valve is normal in structure. Moderate to severe mitral valve regurgitation. No evidence of mitral stenosis.  4. Tricuspid valve regurgitation is moderate.  5. The aortic valve is normal in structure. Aortic valve regurgitation is not visualized. No aortic stenosis is present.  6. The inferior vena cava is normal in size with greater than 50% respiratory variability, suggesting right atrial pressure of 3 mmHg. FINDINGS  Left Ventricle: Left ventricular ejection fraction, by estimation, is <20%. The left ventricle has severely decreased function. The left ventricle demonstrates global hypokinesis. The left ventricular internal cavity size was moderately to severely dilated. There is no left ventricular hypertrophy. Left ventricular diastolic parameters were normal. Right Ventricle: The right ventricular size is normal. No increase in right ventricular wall thickness. Right ventricular systolic function is normal. Left Atrium: Left atrial size was normal in size. Right Atrium: Right atrial size was normal in size. Pericardium: There is no evidence of pericardial effusion. Mitral Valve: The mitral valve is normal in structure. Moderate to severe mitral valve regurgitation. No evidence of mitral valve stenosis. MV peak gradient, 2.1 mmHg. The mean mitral valve gradient is 1.0 mmHg. Tricuspid Valve: The tricuspid valve is normal in structure. Tricuspid valve regurgitation is moderate . No evidence of tricuspid stenosis. Aortic Valve: The aortic valve is normal in structure. Aortic valve regurgitation is not visualized. No aortic stenosis is present. Aortic valve mean gradient measures 6.0 mmHg. Aortic valve peak gradient measures 10.9 mmHg. Aortic valve area, by VTI measures 0.92 cm. Pulmonic Valve: The pulmonic valve was normal in structure. Pulmonic valve regurgitation is not visualized. No  evidence of pulmonic stenosis. Aorta: The aortic root is normal in size and structure. Venous: The inferior vena cava is normal in size with greater than 50% respiratory variability, suggesting right atrial pressure of 3 mmHg. IAS/Shunts: No atrial level shunt detected by color flow Doppler.  LEFT VENTRICLE PLAX 2D LVIDd:         4.60 cm  Diastology LVIDs:         4.50 cm  LV e' medial:    2.94 cm/s LV PW:         0.90 cm  LV E/e' medial:  27.4 LV IVS:        1.10 cm  LV e' lateral:   3.59 cm/s LVOT diam:     1.90 cm  LV E/e' lateral: 22.5 LV SV:  24 LV SV Index:   14 LVOT Area:     2.84 cm  RIGHT VENTRICLE RV Basal diam:  3.30 cm LEFT ATRIUM             Index       RIGHT ATRIUM           Index LA diam:        4.40 cm 2.51 cm/m  RA Area:     17.60 cm LA Vol (A2C):   37.1 ml 21.15 ml/m RA Volume:   41.20 ml  23.48 ml/m LA Vol (A4C):   67.8 ml 38.64 ml/m LA Biplane Vol: 54.6 ml 31.12 ml/m  AORTIC VALVE                    PULMONIC VALVE AV Area (Vmax):    1.00 cm     PV Vmax:       0.75 m/s AV Area (Vmean):   0.94 cm     PV Vmean:      50.500 cm/s AV Area (VTI):     0.92 cm     PV VTI:        0.128 m AV Vmax:           165.00 cm/s  PV Peak grad:  2.2 mmHg AV Vmean:          111.000 cm/s PV Mean grad:  1.0 mmHg AV VTI:            0.263 m AV Peak Grad:      10.9 mmHg AV Mean Grad:      6.0 mmHg LVOT Vmax:         58.10 cm/s LVOT Vmean:        36.900 cm/s LVOT VTI:          0.086 m LVOT/AV VTI ratio: 0.33  AORTA Ao Root diam: 3.10 cm MITRAL VALVE               TRICUSPID VALVE MV Area (PHT): 3.36 cm    TR Peak grad:   42.5 mmHg MV Area VTI:   1.23 cm    TR Vmax:        326.00 cm/s MV Peak grad:  2.1 mmHg MV Mean grad:  1.0 mmHg    SHUNTS MV Vmax:       0.72 m/s    Systemic VTI:  0.09 m MV Vmean:      41.0 cm/s   Systemic Diam: 1.90 cm MV Decel Time: 226 msec MR Peak grad: 82.8 mmHg MR Mean grad: 49.0 mmHg MR Vmax:      455.00 cm/s MR Vmean:     327.0 cm/s MV E velocity: 80.60 cm/s MV A velocity: 42.00  cm/s MV E/A ratio:  1.92 Marcina Millard MD Electronically signed by Marcina Millard MD Signature Date/Time: 05/11/2020/3:10:34 PM    Final      Labs:   Basic Metabolic Panel: Recent Labs  Lab 05/10/20 1124 05/11/20 0551 05/12/20 0442 05/13/20 0443 05/14/20 0519  NA 135 136 138 136 136  K 5.0 4.4 3.4* 4.2 4.0  CL 100 100 101 101 101  CO2 24 22 26 26 25   GLUCOSE 179* 125* 113* 117* 112*  BUN 39* 36* 31* 27* 23  CREATININE 1.17* 1.26* 1.13* 1.11* 0.93  CALCIUM 9.8 9.3 8.7* 8.7* 8.8*  MG  --  1.9 1.9  --   --   PHOS  --  4.9*  --   --   --  GFR Estimated Creatinine Clearance: 39.1 mL/min (by C-G formula based on SCr of 0.93 mg/dL). Liver Function Tests: Recent Labs  Lab 05/10/20 1124  AST 67*  ALT 55*  ALKPHOS 125  BILITOT 0.9  PROT 6.0*  ALBUMIN 3.1*   No results for input(s): LIPASE, AMYLASE in the last 168 hours. No results for input(s): AMMONIA in the last 168 hours. Coagulation profile No results for input(s): INR, PROTIME in the last 168 hours.  CBC: Recent Labs  Lab 05/10/20 1124 05/11/20 0551 05/12/20 0442  WBC 7.3 11.2* 9.2  NEUTROABS 6.1  --  7.7  HGB 11.4* 11.6* 11.6*  HCT 35.5* 36.0 36.5  MCV 90.1 90.9 91.0  PLT 252 224 216   Cardiac Enzymes: No results for input(s): CKTOTAL, CKMB, CKMBINDEX, TROPONINI in the last 168 hours. BNP: Invalid input(s): POCBNP CBG: Recent Labs  Lab 05/13/20 1734 05/13/20 1818 05/13/20 2044 05/14/20 0730 05/14/20 1139  GLUCAP 107* 138* 124* 113* 176*   D-Dimer No results for input(s): DDIMER in the last 72 hours. Hgb A1c No results for input(s): HGBA1C in the last 72 hours. Lipid Profile No results for input(s): CHOL, HDL, LDLCALC, TRIG, CHOLHDL, LDLDIRECT in the last 72 hours. Thyroid function studies No results for input(s): TSH, T4TOTAL, T3FREE, THYROIDAB in the last 72 hours.  Invalid input(s): FREET3 Anemia work up No results for input(s): VITAMINB12, FOLATE, FERRITIN, TIBC, IRON,  RETICCTPCT in the last 72 hours. Microbiology Recent Results (from the past 240 hour(s))  Urine Culture     Status: None   Collection Time: 05/10/20  2:07 PM   Specimen: Urine, Clean Catch  Result Value Ref Range Status   Specimen Description   Final    URINE, CLEAN CATCH Performed at [facility], [address] [place], [place] [address]    Special Requests   Final    NONE Performed at [facility], [address] [place], [place] [address]    Culture   Final    NO GROWTH Performed at [facility], [address] [place], [place] [address]    Report Status 05/12/2020 FINAL  Final  Resp Panel by RT-PCR (Flu A&B, Covid) Nasopharyngeal Swab     Status: None   Collection Time: 05/10/20  2:31 PM   Specimen: Nasopharyngeal Swab; Nasopharyngeal(NP) swabs in vial transport medium  Result Value Ref Range Status   SARS Coronavirus 2 by RT PCR NEGATIVE NEGATIVE Final    Comment: (NOTE) SARS-CoV-2 target nucleic acids are NOT DETECTED.  The SARS-CoV-2 RNA is generally detectable in upper respiratory specimens during the acute phase of infection. The lowest concentration of SARS-CoV-2 viral copies this assay can detect is 138 copies/mL. A negative result does not preclude SARS-Cov-2 infection and should not be used as the sole basis for treatment or other patient management decisions. A negative result may occur with  improper specimen collection/handling, submission of specimen other than nasopharyngeal swab, presence of viral mutation(s) within the areas targeted by this assay, and inadequate number of viral copies(<138 copies/mL). A negative result must be combined with clinical observations, patient history, and epidemiological information. The expected result is Negative.  Fact Sheet for Patients:  BloggerCourse.comhttps://www.fda.gov/media/152166/download  Fact Sheet for Healthcare Providers:  SeriousBroker.ithttps://www.fda.gov/media/152162/download  This test is no t yet  approved or cleared by the [place] FDA and  has been authorized for detection and/or diagnosis of SARS-CoV-2 by FDA under an Emergency Use Authorization (EUA). This EUA will remain  in effect (meaning this test can  be used) for the duration of the COVID-19 declaration under Section 564(b)(1) of the Act, 21 U.S.C.section 360bbb-3(b)(1), unless the authorization is terminated  or revoked sooner.       Influenza A by PCR NEGATIVE NEGATIVE Final   Influenza B by PCR NEGATIVE NEGATIVE Final    Comment: (NOTE) The Xpert Xpress SARS-CoV-2/FLU/RSV plus assay is intended as an aid in the diagnosis of influenza from Nasopharyngeal swab specimens and should not be used as a sole basis for treatment. Nasal washings and aspirates are unacceptable for Xpert Xpress SARS-CoV-2/FLU/RSV testing.  Fact Sheet for Patients: BloggerCourse.com  Fact Sheet for Healthcare Providers: SeriousBroker.it  This test is not yet approved or cleared by the [place] FDA and has been authorized for detection and/or diagnosis of SARS-CoV-2 by FDA under an Emergency Use Authorization (EUA). This EUA will remain in effect (meaning this test can be used) for the duration of the COVID-19 declaration under Section 564(b)(1) of the Act, 21 U.S.C. section 360bbb-3(b)(1), unless the authorization is terminated or revoked.  Performed at [facility], [address], [place], [place] [address]      Discharge Instructions:   Discharge Instructions    Amb Referral to HF Clinic   Complete by: As directed    Diet - low sodium heart healthy   Complete by: As directed    Increase activity slowly   Complete by: As directed      Allergies as of 05/14/2020   No Known Allergies     Medication List    STOP taking these medications   calcium carbonate 500 MG chewable tablet Commonly known as: TUMS - dosed in mg elemental calcium   senna-docusate  8.6-50 MG tablet Commonly known as: Senokot-S     TAKE these medications   acetaminophen 325 MG tablet Commonly known as: TYLENOL Take 2 tablets (650 mg total) by mouth every 6 (six) hours as needed for mild pain or fever. What changed: additional instructions   alendronate 70 MG tablet Commonly known as: FOSAMAX Take 70 mg by mouth once a week. Take with a full glass of water on an empty stomach.   apixaban 2.5 MG Tabs tablet Commonly known as: ELIQUIS Take 1 tablet (2.5 mg total) by mouth 2 (two) times daily.   buPROPion 100 MG 12 hr tablet Commonly known as: WELLBUTRIN SR Take 100 mg by mouth daily.   calcium carbonate 1500 (600 Ca) MG Tabs tablet Commonly known as: OSCAL Take 600 mg by mouth in the morning and at bedtime.   docusate sodium 100 MG capsule Commonly known as: COLACE Take 1 capsule (100 mg total) by mouth 2 (two) times daily.   donepezil 10 MG tablet Commonly known as: ARICEPT Take 10 mg by mouth at bedtime.   ergocalciferol 1.25 MG (50000 UT) capsule Commonly known as: VITAMIN D2 Take 5,000 Units by mouth every 30 (thirty) days.   feeding supplement (PRO-STAT SUGAR FREE 64) Liqd Take 30 mLs by mouth daily.   feeding supplement Liqd Take 237 mLs by mouth 2 (two) times daily between meals. What changed: additional instructions   Fish Oil 1000 MG Caps Take 1,000 mg by mouth daily.   furosemide 20 MG tablet Commonly known as: LASIX Take 1 tablet (20 mg total) by mouth 2 (two) times daily. What changed:   medication strength  how much to take  how to take this  when to take this  additional instructions   levothyroxine 50 MCG tablet Commonly known as: SYNTHROID  Take 50 mcg by mouth daily.   losartan 100 MG tablet Commonly known as: COZAAR Hold until outpatient followup due to acute kidney injury.   meclizine 12.5 MG tablet Commonly known as: ANTIVERT Take 1 tablet (12.5 mg total) by mouth 3 (three) times daily as needed for  dizziness.   metoprolol succinate 50 MG 24 hr tablet Commonly known as: TOPROL-XL Take 1 tablet (50 mg total) by mouth daily.   potassium chloride SA 20 MEQ tablet Commonly known as: KLOR-CON Hold while not taking Lasix.   sertraline 100 MG tablet Commonly known as: ZOLOFT Take 100 mg by mouth daily.       Contact information for after-discharge care    Destination    [facility] [place] Preferred SNF .   Service: Skilled Nursing Contact information: [address] [place] [address] [phone]                   Time coordinating discharge: 32 minutes  Signed:  Lurene Shadow  [facility] 05/14/2020, 4:16 PM   Pager on [email or web address]. If 7PM-7AM, please contact night-coverage at [email or web address]

## 2020-05-14 NOTE — Plan of Care (Signed)

## 2020-05-14 NOTE — Progress Notes (Addendum)
Orders to d/c to [facility]/ attempted to call report x3/ continues to be left on hold/ EMS arranged to transport/ iv removed   Report given to [employer]

## 2020-05-14 NOTE — TOC Transition Note (Signed)
Transition of Care [facility]) - CM/SW Discharge Note   Patient Details  Name: DOHA BOLING MRN: [id number] Date of Birth: 25-Aug-1927  Transition of Care [facility]) CM/SW Contact:  Maree Krabbe, LCSW Phone Number: 05/14/2020, 4:14 PM   Clinical Narrative:   Pending covid results pt will dc to [facility]. RN given the number for report. Transport arranged for 6:30 in the case that Covid has not resulted RN to call [phone]. Awaiting dc summary.    Final next level of care: Skilled Nursing Facility Barriers to Discharge: No Barriers Identified   Patient Goals and CMS Choice Patient states their goals for this hospitalization and ongoing recovery are:: would prefer back to ALF with [facility] over SNF CMS Medicare.gov Compare Post Acute Care list provided to:: Patient Represenative (must comment) Choice offered to / list presented to : [facility] POA / Guardian  Discharge Placement              Patient chooses bed at:  ([facility]) Patient to be transferred to facility by: [facility] Name of family member notified: son Patient and family notified of of transfer: 05/14/20  Discharge Plan and Services                                     Social Determinants of Health (SDOH) Interventions     Readmission Risk Interventions No flowsheet data found.

## 2020-05-14 NOTE — Hospital Course (Signed)
Ms. Deborah Martinez is a 87 year old woman with medical history significant for dementia, chronic systolic CHF, permanent atrial fibrillation on Eliquis, hyperlipidemia, hypertension, iron deficiency anemia, anxiety, depression, hypothyroidism, recurrent UTI.  She presented to the hospital from the assisted living facility because of shallow breathing.  She was admitted to the hospital for acute exacerbation of chronic systolic CHF.  Troponins were mildly elevated but this was attributed to demand ischemia.  She was treated with IV Lasix.  She had atrial fibrillation with RVR but she converted to normal sinus rhythm.  2D echo showed EF estimated at less than 20%, moderate to severe mitral regurgitation, moderate tricuspid regurgitation. Cardiologist was consulted to assist with management.  Initially, there was concern for UTI because of abnormal urinalysis so she was treated with empiric IV antibiotics.  However, urine culture was unremarkable and antibiotics were discontinued because she was deemed not to have UTI.  She was evaluated by PT and OT who recommended further rehabilitation at the skilled nursing facility.  Her condition has improved and she is deemed stable for discharge to SNF.

## 2020-05-15 ENCOUNTER — Ambulatory Visit: Payer: Medicare Other | Admitting: Family

## 2020-05-29 ENCOUNTER — Ambulatory Visit: Payer: Medicare Other | Admitting: Family

## 2020-06-28 ENCOUNTER — Ambulatory Visit: Payer: Medicare Other | Admitting: Family

## 2020-07-20 ENCOUNTER — Ambulatory Visit: Payer: Medicare Other | Admitting: Family

## 2020-08-07 ENCOUNTER — Ambulatory Visit: Payer: Medicare Other | Admitting: Family

## 2021-01-13 DEATH — deceased
